# Patient Record
Sex: Male | Born: 1945 | Race: White | Hispanic: No | Marital: Married | Smoking: Never smoker
Health system: Southern US, Community
[De-identification: ages and names within clinical notes are randomized; demographics above are authoritative.]

## PROBLEM LIST (undated history)

## (undated) DIAGNOSIS — C7931 Secondary malignant neoplasm of brain: Secondary | ICD-10-CM

## (undated) DIAGNOSIS — K769 Liver disease, unspecified: Secondary | ICD-10-CM

## (undated) DIAGNOSIS — I219 Acute myocardial infarction, unspecified: Secondary | ICD-10-CM

## (undated) DIAGNOSIS — R911 Solitary pulmonary nodule: Secondary | ICD-10-CM

## (undated) DIAGNOSIS — I2510 Atherosclerotic heart disease of native coronary artery without angina pectoris: Secondary | ICD-10-CM

## (undated) DIAGNOSIS — C439 Malignant melanoma of skin, unspecified: Secondary | ICD-10-CM

## (undated) DIAGNOSIS — I10 Essential (primary) hypertension: Secondary | ICD-10-CM

## (undated) DIAGNOSIS — M1990 Unspecified osteoarthritis, unspecified site: Secondary | ICD-10-CM

## (undated) DIAGNOSIS — K219 Gastro-esophageal reflux disease without esophagitis: Secondary | ICD-10-CM

## (undated) DIAGNOSIS — C799 Secondary malignant neoplasm of unspecified site: Secondary | ICD-10-CM

## (undated) DIAGNOSIS — E785 Hyperlipidemia, unspecified: Secondary | ICD-10-CM

## (undated) HISTORY — DX: Unspecified osteoarthritis, unspecified site: M19.90

## (undated) HISTORY — PX: HERNIA REPAIR: SHX51

## (undated) HISTORY — DX: Gastro-esophageal reflux disease without esophagitis: K21.9

## (undated) HISTORY — DX: Acute myocardial infarction, unspecified: I21.9

## (undated) HISTORY — PX: APPENDECTOMY: SHX54

---

## 1999-10-10 ENCOUNTER — Ambulatory Visit (HOSPITAL_COMMUNITY): Admission: RE | Admit: 1999-10-10 | Discharge: 1999-10-10 | Payer: Self-pay | Admitting: Gastroenterology

## 2001-10-19 ENCOUNTER — Encounter: Payer: Self-pay | Admitting: *Deleted

## 2001-10-19 ENCOUNTER — Inpatient Hospital Stay (HOSPITAL_COMMUNITY): Admission: EM | Admit: 2001-10-19 | Discharge: 2001-10-21 | Payer: Self-pay | Admitting: *Deleted

## 2001-11-01 ENCOUNTER — Encounter: Admission: RE | Admit: 2001-11-01 | Discharge: 2001-11-01 | Payer: Self-pay | Admitting: Family Medicine

## 2001-11-15 ENCOUNTER — Encounter (HOSPITAL_COMMUNITY): Admission: RE | Admit: 2001-11-15 | Discharge: 2002-02-13 | Payer: Self-pay | Admitting: Cardiovascular Disease

## 2002-01-19 ENCOUNTER — Encounter: Admission: RE | Admit: 2002-01-19 | Discharge: 2002-01-19 | Payer: Self-pay | Admitting: Cardiovascular Disease

## 2002-01-19 ENCOUNTER — Encounter: Payer: Self-pay | Admitting: Cardiovascular Disease

## 2004-02-08 ENCOUNTER — Ambulatory Visit (HOSPITAL_COMMUNITY): Admission: RE | Admit: 2004-02-08 | Discharge: 2004-02-08 | Payer: Self-pay | Admitting: Cardiovascular Disease

## 2015-08-20 ENCOUNTER — Ambulatory Visit (INDEPENDENT_AMBULATORY_CARE_PROVIDER_SITE_OTHER): Payer: Medicare Other | Admitting: Physician Assistant

## 2015-08-20 VITALS — BP 130/80 | HR 73 | Temp 97.6°F | Resp 16 | Ht 71.0 in | Wt 190.0 lb

## 2015-08-20 DIAGNOSIS — R059 Cough, unspecified: Secondary | ICD-10-CM

## 2015-08-20 DIAGNOSIS — R05 Cough: Secondary | ICD-10-CM | POA: Diagnosis not present

## 2015-08-20 DIAGNOSIS — J019 Acute sinusitis, unspecified: Secondary | ICD-10-CM

## 2015-08-20 MED ORDER — GUAIFENESIN ER 1200 MG PO TB12: 1.0000 | ORAL_TABLET | Freq: Two times a day (BID) | ORAL | Status: DC | PRN

## 2015-08-20 MED ORDER — HYDROCOD POLST-CPM POLST ER 10-8 MG/5ML PO SUER: 5.0000 mL | Freq: Every evening | ORAL | Status: DC | PRN

## 2015-08-20 MED ORDER — AZITHROMYCIN 250 MG PO TABS: ORAL_TABLET | ORAL | Status: DC

## 2015-08-20 NOTE — Progress Notes (Signed)
[facility] [address], [place] [address] [phone]  Date:  08/20/2015   Name:  Chad Ball   DOB:  [birth year]   MRN:  [id number]  PCP:  No primary care provider on file.   Chief Complaint  Patient presents with  . Nasal Congestion    x 2 weeks  . Cough  . Headache     History of Present Illness:  Chad Ball is a 70 y.o. male patient who presents to [facility]   2 weeks of sneezing and coughing.  Coughing up phlegm.  Took dayquil and nyquil which got better one week ago.  He now has hadache of left side.  Had fever of 101.  Last night, has headache and ear pain.  He is coughing which makes his head hurt.  Dark mucus--dark green green.  No sob or dyspnea.  He had hx of pneumonia 8 months ago.  Similar symptoms.  No sore throat.  He has some congested.  Left sided facial pain that started last night. No watery eyes.  No runny nose.    There are no active problems to display for this patient.   Past Medical History  Diagnosis Date  . Arthritis   . GERD (gastroesophageal reflux disease)   . Myocardial infarction [facility])     Past Surgical History  Procedure Laterality Date  . Appendectomy    . Hernia repair      Social History  Substance Use Topics  . Smoking status: Never Smoker   . Smokeless tobacco: None  . Alcohol Use: None    Family History  Problem Relation Age of Onset  . Heart disease Mother   . Hyperlipidemia Mother     No Known Allergies  Medication list has been reviewed and updated.  No current outpatient prescriptions on file prior to visit.   No current facility-administered medications on file prior to visit.    ROS ROS otherwise unremarkable unless listed above.   Physical Examination: BP 130/80 mmHg  Pulse 73  Temp(Src) 97.6 F (36.4 C)  Resp 16  Ht 5\' 11"  (1.803 m)  Wt 190 lb (86.183 kg)  BMI 26.51 kg/m2  SpO2 95% Ideal Body Weight: Weight in (lb) to have BMI = 25: 178.9  Physical Exam   Constitutional: He is oriented to person, place, and time. He appears well-developed and well-nourished. No distress.  HENT:  Head: Atraumatic.  Right Ear: Tympanic membrane, external ear and ear canal normal.  Left Ear: Tympanic membrane, external ear and ear canal normal.  Nose: Mucosal edema and rhinorrhea present. No epistaxis.  No foreign bodies. Right sinus exhibits no maxillary sinus tenderness and no frontal sinus tenderness. Left sinus exhibits maxillary sinus tenderness. Left sinus exhibits no frontal sinus tenderness.  Mouth/Throat: No uvula swelling. No oropharyngeal exudate, posterior oropharyngeal edema or posterior oropharyngeal erythema.  Eyes: Conjunctivae, EOM and lids are normal. Pupils are equal, round, and reactive to light. Right eye exhibits normal extraocular motion. Left eye exhibits normal extraocular motion.  Neck: Trachea normal and full passive range of motion without pain. No edema and no erythema present.  Cardiovascular: Normal rate.   Pulmonary/Chest: Effort normal. No respiratory distress. He has no decreased breath sounds. He has no wheezes. He has no rhonchi.  Lymphadenopathy:    He has no cervical adenopathy.  Neurological: He is alert and oriented to person, place, and time.  Skin: Skin is warm and dry. He is not diaphoretic.  Psychiatric:  He has a normal mood and affect. His behavior is normal.     Assessment and Plan: Chad Ball is a 70 y.o. male who is here today for congestion and cough.   We will treat with abx at this time.  Given cough medication for support.  He will contact me if he has no improvement in the next 10 days.   Subacute sinusitis, unspecified location - Plan: Guaifenesin (MUCINEX MAXIMUM STRENGTH) 1200 MG TB12, azithromycin (ZITHROMAX) 250 MG tablet  Cough - Plan: chlorpheniramine-HYDROcodone (TUSSIONEX PENNKINETIC ER) 10-8 MG/5ML SUER  Ivar Drape, PA-C [facility] 08/20/2015 10:00 AM

## 2015-08-20 NOTE — Patient Instructions (Addendum)
IF you received an x-ray today, you will receive an invoice from [facility]. Please contact [facility] at [phone] with questions or concerns regarding your invoice.   IF you received labwork today, you will receive an invoice from [employer]. Please contact [facility] at [phone] with questions or concerns regarding your invoice.   Our billing staff will not be able to assist you with questions regarding bills from these companies.  You will be contacted with the lab results as soon as they are available. The fastest way to get your results is to activate your My Chart account. Instructions are located on the last page of this paperwork. If you have not heard from [place] regarding the results in 2 weeks, please contact this office.   Please make sure that you are hydrating well with 64 oz of water or more.  Sinusitis, Adult Sinusitis is redness, soreness, and inflammation of the paranasal sinuses. Paranasal sinuses are air pockets within the bones of your face. They are located beneath your eyes, in the middle of your forehead, and above your eyes. In healthy paranasal sinuses, mucus is able to drain out, and air is able to circulate through them by way of your nose. However, when your paranasal sinuses are inflamed, mucus and air can become trapped. This can allow bacteria and other germs to grow and cause infection. Sinusitis can develop quickly and last only a short time (acute) or continue over a long period (chronic). Sinusitis that lasts for more than 12 weeks is considered chronic. CAUSES Causes of sinusitis include:  Allergies.  Structural abnormalities, such as displacement of the cartilage that separates your nostrils (deviated septum), which can decrease the air flow through your nose and sinuses and affect sinus drainage.  Functional abnormalities, such as when the small hairs (cilia) that line your sinuses and help remove mucus do  not work properly or are not present. SIGNS AND SYMPTOMS Symptoms of acute and chronic sinusitis are the same. The primary symptoms are pain and pressure around the affected sinuses. Other symptoms include:  Upper toothache.  Earache.  Headache.  Bad breath.  Decreased sense of smell and taste.  A cough, which worsens when you are lying flat.  Fatigue.  Fever.  Thick drainage from your nose, which often is green and may contain pus (purulent).  Swelling and warmth over the affected sinuses. DIAGNOSIS Your health care provider will perform a physical exam. During your exam, your health care provider may perform any of the following to help determine if you have acute sinusitis or chronic sinusitis:  Look in your nose for signs of abnormal growths in your nostrils (nasal polyps).  Tap over the affected sinus to check for signs of infection.  View the inside of your sinuses using an imaging device that has a light attached (endoscope). If your health care provider suspects that you have chronic sinusitis, one or more of the following tests may be recommended:  Allergy tests.  Nasal culture. A sample of mucus is taken from your nose, sent to a lab, and screened for bacteria.  Nasal cytology. A sample of mucus is taken from your nose and examined by your health care provider to determine if your sinusitis is related to an allergy. TREATMENT Most cases of acute sinusitis are related to a viral infection and will resolve on their own within 10 days. Sometimes, medicines are prescribed to help relieve symptoms of both acute and chronic sinusitis. These may include pain medicines,  decongestants, nasal steroid sprays, or saline sprays. However, for sinusitis related to a bacterial infection, your health care provider will prescribe antibiotic medicines. These are medicines that will help kill the bacteria causing the infection. Rarely, sinusitis is caused by a fungal infection. In  these cases, your health care provider will prescribe antifungal medicine. For some cases of chronic sinusitis, surgery is needed. Generally, these are cases in which sinusitis recurs more than 3 times per year, despite other treatments. HOME CARE INSTRUCTIONS  Drink plenty of water. Water helps thin the mucus so your sinuses can drain more easily.  Use a humidifier.  Inhale steam 3-4 times a day (for example, sit in the bathroom with the shower running).  Apply a warm, moist washcloth to your face 3-4 times a day, or as directed by your health care provider.  Use saline nasal sprays to help moisten and clean your sinuses.  Take medicines only as directed by your health care provider.  If you were prescribed either an antibiotic or antifungal medicine, finish it all even if you start to feel better. SEEK IMMEDIATE MEDICAL CARE IF:  You have increasing pain or severe headaches.  You have nausea, vomiting, or drowsiness.  You have swelling around your face.  You have vision problems.  You have a stiff neck.  You have difficulty breathing.   This information is not intended to replace advice given to you by your health care provider. Make sure you discuss any questions you have with your health care provider.   Document Released: 04/13/2005 Document Revised: 05/04/2014 Document Reviewed: 04/28/2011 Elsevier Interactive Patient Education [employer].

## 2015-09-02 ENCOUNTER — Ambulatory Visit (INDEPENDENT_AMBULATORY_CARE_PROVIDER_SITE_OTHER): Payer: Medicare Other | Admitting: Family Medicine

## 2015-09-02 VITALS — BP 110/68 | HR 62 | Temp 97.4°F | Resp 18 | Ht 71.0 in | Wt 185.0 lb

## 2015-09-02 DIAGNOSIS — I2510 Atherosclerotic heart disease of native coronary artery without angina pectoris: Secondary | ICD-10-CM | POA: Diagnosis not present

## 2015-09-02 DIAGNOSIS — I10 Essential (primary) hypertension: Secondary | ICD-10-CM

## 2015-09-02 DIAGNOSIS — N390 Urinary tract infection, site not specified: Secondary | ICD-10-CM | POA: Diagnosis not present

## 2015-09-02 DIAGNOSIS — E785 Hyperlipidemia, unspecified: Secondary | ICD-10-CM

## 2015-09-02 DIAGNOSIS — R509 Fever, unspecified: Secondary | ICD-10-CM

## 2015-09-02 DIAGNOSIS — R8281 Pyuria: Secondary | ICD-10-CM

## 2015-09-02 HISTORY — DX: Essential (primary) hypertension: I10

## 2015-09-02 HISTORY — DX: Hyperlipidemia, unspecified: E78.5

## 2015-09-02 HISTORY — DX: Atherosclerotic heart disease of native coronary artery without angina pectoris: I25.10

## 2015-09-02 LAB — POCT URINALYSIS DIP (MANUAL ENTRY)
Glucose, UA: NEGATIVE
Nitrite, UA: POSITIVE — AB
Protein Ur, POC: 100 — AB
Spec Grav, UA: 1.025
Urobilinogen, UA: 1
pH, UA: 5

## 2015-09-02 LAB — POCT CBC
Granulocyte percent: 90.5 %G — AB (ref 37–80)
HCT, POC: 42.3 % — AB (ref 43.5–53.7)
Hemoglobin: 14.7 g/dL (ref 14.1–18.1)
Lymph, poc: 1.7 (ref 0.6–3.4)
MCH, POC: 31.1 pg (ref 27–31.2)
MCHC: 34.9 g/dL (ref 31.8–35.4)
MCV: 89.1 fL (ref 80–97)
MID (cbc): 0.6 (ref 0–0.9)
MPV: 7.2 fL (ref 0–99.8)
POC Granulocyte: 21.4 — AB (ref 2–6.9)
POC LYMPH PERCENT: 7.1 %L — AB (ref 10–50)
POC MID %: 2.4 %M (ref 0–12)
Platelet Count, POC: 255 10*3/uL (ref 142–424)
RBC: 4.74 M/uL (ref 4.69–6.13)
RDW, POC: 13.7 %
WBC: 23.6 10*3/uL — AB (ref 4.6–10.2)

## 2015-09-02 LAB — POC MICROSCOPIC URINALYSIS (UMFC)

## 2015-09-02 MED ORDER — CIPROFLOXACIN HCL 500 MG PO TABS: 500.0000 mg | ORAL_TABLET | Freq: Two times a day (BID) | ORAL | Status: DC

## 2015-09-02 NOTE — Progress Notes (Signed)
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  Associated with fatigue and urinary frequency.   Married.  Travels 2 days a week up to [place].  Sig Negs:  No cough or sinus symptoms since taking the Zpak two weeks ago.  No sore throat.  No travel, no tick bite, no rash, no hematuria, no h/o flank pain or kidney stone.  Associated: some back soreness  PMHx:  Coronary stent  PCP:  Dr. Ouida Sills in [place], [place]  Objective: BP 110/68 mmHg  Pulse 62  Temp(Src) 97.4 F (36.3 C) (Oral)  Resp 18  Ht 5\' 11"  (1.803 m)  Wt 185 lb (83.915 kg)  BMI 25.81 kg/m2  SpO2 98% Alert, NAD, articulate  Neck: supple, no adenopathy HEENT: unremarkable Chest:  Clear Heart: regular, no murmur or gallop Abdomen:  No HSM, tenderness, mass. Skin: no icterus or rash Ext: moving without problem in exam room Results for orders placed or performed in visit on 09/02/15  POCT urinalysis dipstick  Result Value Ref Range   Color, UA brown (A) yellow   Clarity, UA cloudy (A) clear   Glucose, UA negative negative   Bilirubin, UA large (A) negative   Ketones, POC UA small (15) (A) negative   Spec Grav, UA 1.025    Blood, UA moderate (A) negative   pH, UA 5.0    Protein Ur, POC =100 (A) negative   Urobilinogen, UA 1.0    Nitrite, UA Positive (A) Negative   Leukocytes, UA Trace (A) Negative  POCT Microscopic Urinalysis ([facility])  Result Value Ref Range   WBC,UR,HPF,POC Too numerous to count  (A) None WBC/hpf   RBC,UR,HPF,POC Moderate (A) None RBC/hpf   Bacteria Moderate (A) None, Too numerous to count   Mucus Present (A) Absent   Epithelial Cells, UR Per Microscopy Few (A) None, Too numerous to count cells/hpf  POCT CBC  Result Value Ref Range   WBC 23.6 (A) 4.6 - 10.2 K/uL   Lymph, poc 1.7 0.6 - 3.4   POC LYMPH PERCENT 7.1 (A) 10 - 50 %L   MID (cbc) 0.6 0 - 0.9   POC MID % 2.4 0 - 12 %M   POC Granulocyte 21.4 (A) 2 - 6.9   Granulocyte percent 90.5 (A) 37 - 80 %G   RBC 4.74  4.69 - 6.13 M/uL   Hemoglobin 14.7 14.1 - 18.1 g/dL   HCT, POC 42.3 (A) 43.5 - 53.7 %   MCV 89.1 80 - 97 fL   MCH, POC 31.1 27 - 31.2 pg   MCHC 34.9 31.8 - 35.4 g/dL   RDW, POC 13.7 %   Platelet Count, POC 255 142 - 424 K/uL   MPV 7.2 0 - 99.8 fL    Fever and chills - Plan: POCT urinalysis dipstick, POCT Microscopic Urinalysis ([facility]), POCT CBC, Urine culture, ciprofloxacin (CIPRO) 500 MG tablet  Essential hypertension  Hyperlipidemia  CAD in native artery  Pyuria - Plan: Urine culture, ciprofloxacin (CIPRO) 500 MG tablet  Robyn Haber, MD

## 2015-09-02 NOTE — Patient Instructions (Addendum)
Please return when you finish the antibiotics so we can recheck the urine   Urinary Tract Infection Urinary tract infections (UTIs) can develop anywhere along your urinary tract. Your urinary tract is your body's drainage system for removing wastes and extra water. Your urinary tract includes two kidneys, two ureters, a bladder, and a urethra. Your kidneys are a pair of bean-shaped organs. Each kidney is about the size of your fist. They are located below your ribs, one on each side of your spine. CAUSES Infections are caused by microbes, which are microscopic organisms, including fungi, viruses, and bacteria. These organisms are so small that they can only be seen through a microscope. Bacteria are the microbes that most commonly cause UTIs. SYMPTOMS  Symptoms of UTIs may vary by age and gender of the patient and by the location of the infection. Symptoms in young women typically include a frequent and intense urge to urinate and a painful, burning feeling in the bladder or urethra during urination. Older women and men are more likely to be tired, shaky, and weak and have muscle aches and abdominal pain. A fever may mean the infection is in your kidneys. Other symptoms of a kidney infection include pain in your back or sides below the ribs, nausea, and vomiting. DIAGNOSIS To diagnose a UTI, your caregiver will ask you about your symptoms. Your caregiver will also ask you to provide a urine sample. The urine sample will be tested for bacteria and white blood cells. White blood cells are made by your body to help fight infection. TREATMENT  Typically, UTIs can be treated with medication. Because most UTIs are caused by a bacterial infection, they usually can be treated with the use of antibiotics. The choice of antibiotic and length of treatment depend on your symptoms and the type of bacteria causing your infection. HOME CARE INSTRUCTIONS  If you were prescribed antibiotics, take them exactly as your  caregiver instructs you. Finish the medication even if you feel better after you have only taken some of the medication.  Drink enough water and fluids to keep your urine clear or pale yellow.  Avoid caffeine, tea, and carbonated beverages. They tend to irritate your bladder.  Empty your bladder often. Avoid holding urine for long periods of time.  Empty your bladder before and after sexual intercourse.  After a bowel movement, women should cleanse from front to back. Use each tissue only once. SEEK MEDICAL CARE IF:   You have back pain.  You develop a fever.  Your symptoms do not begin to resolve within 3 days. SEEK IMMEDIATE MEDICAL CARE IF:   You have severe back pain or lower abdominal pain.  You develop chills.  You have nausea or vomiting.  You have continued burning or discomfort with urination. MAKE SURE YOU:   Understand these instructions.  Will watch your condition.  Will get help right away if you are not doing well or get worse.   This information is not intended to replace advice given to you by your health care provider. Make sure you discuss any questions you have with your health care provider.   Document Released: 01/21/2005 Document Revised: 01/02/2015 Document Reviewed: 05/22/2011 Elsevier Interactive Patient Education 2016 [employer].    IF you received an x-ray today, you will receive an invoice from [facility]. Please contact [facility] at [phone] with questions or concerns regarding your invoice.   IF you received labwork today, you will receive an invoice from [facility]. Please contact [facility] at [phone] with questions or concerns regarding your invoice.   Our billing staff will not be able to assist you with questions regarding bills from these companies.  You will be contacted with the lab results as soon as they are available. The fastest way to get your results is to activate  your My Chart account. Instructions are located on the last page of this paperwork. If you have not heard from [place] regarding the results in 2 weeks, please contact this office.

## 2015-09-04 LAB — URINE CULTURE: Colony Count: >=100000

## 2015-09-06 ENCOUNTER — Encounter: Payer: Self-pay | Admitting: *Deleted

## 2015-09-17 ENCOUNTER — Telehealth: Payer: Self-pay | Admitting: *Deleted

## 2015-09-17 NOTE — Telephone Encounter (Signed)
Patient was given results 

## 2015-11-24 ENCOUNTER — Inpatient Hospital Stay (HOSPITAL_COMMUNITY): Payer: Medicare Other

## 2015-11-24 ENCOUNTER — Encounter (HOSPITAL_COMMUNITY): Payer: Self-pay | Admitting: *Deleted

## 2015-11-24 ENCOUNTER — Telehealth: Payer: Self-pay | Admitting: Internal Medicine

## 2015-11-24 ENCOUNTER — Inpatient Hospital Stay (HOSPITAL_COMMUNITY)
Admission: AD | Admit: 2015-11-24 | Discharge: 2015-12-05 | DRG: 054 | Disposition: A | Discharge status: Skilled Nursing Facility | Payer: Medicare Other | Source: Other Acute Inpatient Hospital | Attending: Family Medicine | Admitting: Family Medicine

## 2015-11-24 DIAGNOSIS — C7931 Secondary malignant neoplasm of brain: Principal | ICD-10-CM | POA: Diagnosis present

## 2015-11-24 DIAGNOSIS — C799 Secondary malignant neoplasm of unspecified site: Secondary | ICD-10-CM

## 2015-11-24 DIAGNOSIS — C7800 Secondary malignant neoplasm of unspecified lung: Secondary | ICD-10-CM | POA: Diagnosis present

## 2015-11-24 DIAGNOSIS — R627 Adult failure to thrive: Secondary | ICD-10-CM | POA: Diagnosis present

## 2015-11-24 DIAGNOSIS — D72829 Elevated white blood cell count, unspecified: Secondary | ICD-10-CM | POA: Diagnosis present

## 2015-11-24 DIAGNOSIS — T380X5A Adverse effect of glucocorticoids and synthetic analogues, initial encounter: Secondary | ICD-10-CM | POA: Diagnosis present

## 2015-11-24 DIAGNOSIS — I2510 Atherosclerotic heart disease of native coronary artery without angina pectoris: Secondary | ICD-10-CM | POA: Diagnosis present

## 2015-11-24 DIAGNOSIS — I10 Essential (primary) hypertension: Secondary | ICD-10-CM | POA: Diagnosis not present

## 2015-11-24 DIAGNOSIS — R911 Solitary pulmonary nodule: Secondary | ICD-10-CM

## 2015-11-24 DIAGNOSIS — Z8249 Family history of ischemic heart disease and other diseases of the circulatory system: Secondary | ICD-10-CM

## 2015-11-24 DIAGNOSIS — C439 Malignant melanoma of skin, unspecified: Secondary | ICD-10-CM | POA: Diagnosis present

## 2015-11-24 DIAGNOSIS — K7689 Other specified diseases of liver: Secondary | ICD-10-CM | POA: Diagnosis not present

## 2015-11-24 DIAGNOSIS — K769 Liver disease, unspecified: Secondary | ICD-10-CM

## 2015-11-24 DIAGNOSIS — Z79899 Other long term (current) drug therapy: Secondary | ICD-10-CM

## 2015-11-24 DIAGNOSIS — M1990 Unspecified osteoarthritis, unspecified site: Secondary | ICD-10-CM | POA: Diagnosis present

## 2015-11-24 DIAGNOSIS — Z7982 Long term (current) use of aspirin: Secondary | ICD-10-CM | POA: Diagnosis not present

## 2015-11-24 DIAGNOSIS — E785 Hyperlipidemia, unspecified: Secondary | ICD-10-CM | POA: Diagnosis present

## 2015-11-24 DIAGNOSIS — K219 Gastro-esophageal reflux disease without esophagitis: Secondary | ICD-10-CM | POA: Diagnosis present

## 2015-11-24 DIAGNOSIS — G936 Cerebral edema: Secondary | ICD-10-CM | POA: Diagnosis present

## 2015-11-24 DIAGNOSIS — G9389 Other specified disorders of brain: Secondary | ICD-10-CM | POA: Diagnosis present

## 2015-11-24 DIAGNOSIS — D496 Neoplasm of unspecified behavior of brain: Secondary | ICD-10-CM | POA: Diagnosis not present

## 2015-11-24 DIAGNOSIS — G939 Disorder of brain, unspecified: Secondary | ICD-10-CM

## 2015-11-24 DIAGNOSIS — I252 Old myocardial infarction: Secondary | ICD-10-CM | POA: Diagnosis not present

## 2015-11-24 HISTORY — DX: Secondary malignant neoplasm of brain: C79.31

## 2015-11-24 LAB — COMPREHENSIVE METABOLIC PANEL
ALBUMIN: 3.8 g/dL (ref 3.5–5.0)
ALT: 28 U/L (ref 17–63)
AST: 32 U/L (ref 15–41)
Alkaline Phosphatase: 81 U/L (ref 38–126)
Anion gap: 9 (ref 5–15)
BILIRUBIN TOTAL: 1.3 mg/dL — AB (ref 0.3–1.2)
BUN: 26 mg/dL — AB (ref 6–20)
CHLORIDE: 102 mmol/L (ref 101–111)
CO2: 25 mmol/L (ref 22–32)
CREATININE: 0.93 mg/dL (ref 0.61–1.24)
Calcium: 9.4 mg/dL (ref 8.9–10.3)
GFR calc Af Amer: >60 mL/min (ref >60)
GLUCOSE: 148 mg/dL — AB (ref 65–99)
POTASSIUM: 3.7 mmol/L (ref 3.5–5.1)
Sodium: 136 mmol/L (ref 135–145)
Total Protein: 7.2 g/dL (ref 6.5–8.1)

## 2015-11-24 LAB — CBC WITH DIFFERENTIAL/PLATELET
Basophils Absolute: 0 10*3/uL (ref 0.0–0.1)
Basophils Relative: 0 %
EOS ABS: 0 10*3/uL (ref 0.0–0.7)
EOS PCT: 0 %
HCT: 45.8 % (ref 39.0–52.0)
Hemoglobin: 15.4 g/dL (ref 13.0–17.0)
LYMPHS ABS: 0.6 10*3/uL — AB (ref 0.7–4.0)
LYMPHS PCT: 2 %
MCH: 30.6 pg (ref 26.0–34.0)
MCHC: 33.6 g/dL (ref 30.0–36.0)
MCV: 91.1 fL (ref 78.0–100.0)
MONO ABS: 1.4 10*3/uL — AB (ref 0.1–1.0)
Monocytes Relative: 6 %
Neutro Abs: 21.9 10*3/uL — ABNORMAL HIGH (ref 1.7–7.7)
Neutrophils Relative %: 92 %
PLATELETS: 324 10*3/uL (ref 150–400)
RBC: 5.03 MIL/uL (ref 4.22–5.81)
RDW: 14.1 % (ref 11.5–15.5)
WBC: 23.9 10*3/uL — AB (ref 4.0–10.5)

## 2015-11-24 LAB — PROTIME-INR
INR: 1.04
Prothrombin Time: 13.6 seconds (ref 11.4–15.2)

## 2015-11-24 LAB — MRSA PCR SCREENING: MRSA BY PCR: NEGATIVE

## 2015-11-24 LAB — TSH: TSH: 0.924 u[IU]/mL (ref 0.350–4.500)

## 2015-11-24 LAB — APTT: APTT: 26 s (ref 24–36)

## 2015-11-24 MED ORDER — LEVETIRACETAM 500 MG PO TABS: 500.0000 mg | ORAL_TABLET | Freq: Two times a day (BID) | ORAL | Status: DC

## 2015-11-24 MED ORDER — METOPROLOL SUCCINATE ER 25 MG PO TB24: 25.0000 mg | ORAL_TABLET | Freq: Every day | ORAL | Status: DC

## 2015-11-24 MED ORDER — SODIUM CHLORIDE 0.9 % IV SOLN
750.0000 mg | Freq: Two times a day (BID) | INTRAVENOUS | Status: DC
  Administered 2015-11-25 – 2015-11-30 (×11): 750 mg via INTRAVENOUS
  Filled 2015-11-24 (×13): qty 7.5

## 2015-11-24 MED ORDER — MIDAZOLAM HCL 2 MG/2ML IJ SOLN: 2.0000 mg | Freq: Once | INTRAMUSCULAR | Status: AC

## 2015-11-24 MED ORDER — SODIUM CHLORIDE 0.9% FLUSH
3.0000 mL | Freq: Two times a day (BID) | INTRAVENOUS | Status: DC
  Administered 2015-11-24 – 2015-12-03 (×9): 3 mL via INTRAVENOUS

## 2015-11-24 MED ORDER — ACETAMINOPHEN 325 MG PO TABS
650.0000 mg | ORAL_TABLET | Freq: Four times a day (QID) | ORAL | Status: DC | PRN
Start: 2015-11-24 — End: 2015-11-25

## 2015-11-24 MED ORDER — ATORVASTATIN CALCIUM 40 MG PO TABS
80.0000 mg | ORAL_TABLET | Freq: Every day | ORAL | Status: DC
  Administered 2015-11-25 – 2015-12-04 (×8): 80 mg via ORAL
  Filled 2015-11-24: qty 1
  Filled 2015-11-24: qty 2
  Filled 2015-11-24: qty 1
  Filled 2015-11-24: qty 2
  Filled 2015-11-24: qty 1
  Filled 2015-11-24: qty 2
  Filled 2015-11-24: qty 1
  Filled 2015-11-24: qty 2

## 2015-11-24 MED ORDER — DEXAMETHASONE SODIUM PHOSPHATE 10 MG/ML IJ SOLN
INTRAMUSCULAR | Status: AC
  Filled 2015-11-24: qty 1

## 2015-11-24 MED ORDER — METOPROLOL TARTRATE 12.5 MG HALF TABLET
12.5000 mg | ORAL_TABLET | Freq: Two times a day (BID) | ORAL | Status: DC
  Administered 2015-11-25: 12.5 mg via ORAL
  Filled 2015-11-24: qty 1

## 2015-11-24 MED ORDER — PANTOPRAZOLE SODIUM 40 MG PO TBEC
40.0000 mg | DELAYED_RELEASE_TABLET | Freq: Every day | ORAL | Status: DC
  Administered 2015-11-25 – 2015-11-27 (×2): 40 mg via ORAL
  Filled 2015-11-24 (×2): qty 1

## 2015-11-24 MED ORDER — MIDAZOLAM HCL 2 MG/2ML IJ SOLN
INTRAMUSCULAR | Status: AC
  Administered 2015-11-24: 23:00:00
  Filled 2015-11-24: qty 2

## 2015-11-24 MED ORDER — DEXAMETHASONE SODIUM PHOSPHATE 4 MG/ML IJ SOLN
4.0000 mg | Freq: Four times a day (QID) | INTRAMUSCULAR | Status: DC
  Administered 2015-11-25 – 2015-12-05 (×41): 4 mg via INTRAVENOUS
  Filled 2015-11-24 (×41): qty 1

## 2015-11-24 MED ORDER — HYDROCODONE-ACETAMINOPHEN 5-325 MG PO TABS
1.0000 | ORAL_TABLET | Freq: Four times a day (QID) | ORAL | Status: DC | PRN
  Administered 2015-11-24 – 2015-11-25 (×2): 1 via ORAL
  Filled 2015-11-24 (×2): qty 1

## 2015-11-24 MED ORDER — DEXAMETHASONE SODIUM PHOSPHATE 10 MG/ML IJ SOLN
10.0000 mg | Freq: Once | INTRAMUSCULAR | Status: AC
  Administered 2015-11-24: 10 mg via INTRAVENOUS

## 2015-11-24 MED ORDER — LOSARTAN POTASSIUM 25 MG PO TABS
25.0000 mg | ORAL_TABLET | Freq: Every day | ORAL | Status: DC
  Administered 2015-11-25 – 2015-11-27 (×2): 25 mg via ORAL
  Filled 2015-11-24 (×3): qty 1

## 2015-11-24 MED ORDER — ADULT MULTIVITAMIN W/MINERALS CH
1.0000 | ORAL_TABLET | Freq: Every day | ORAL | Status: DC
  Administered 2015-11-25 – 2015-12-05 (×10): 1 via ORAL
  Filled 2015-11-24 (×10): qty 1

## 2015-11-24 NOTE — Telephone Encounter (Signed)
Received call from [facility] [address] Jacinto Halim, hospitalist via [facility] trx.  trx pending NSG acceptance.  Pt w/ hx of cad prior stents, sp melanoma skin resection 1 month ago, p/w confusion outside hospital.  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  Needs NSG eval.  Pt from [facility], requesting trx.  CT CAP w/ pulm nodule noted as well.  Currently VSS, neuro stable, needs vest restraints, still confused.  Wbc 13, no signs of infection, on decadron 4 qid, and keppra 500bid (no sz noted on exam, emp trx).  Awaiting NSG acceptance, to be paged to [address]  Stepdown bed if NSG accepts c/s.

## 2015-11-24 NOTE — Consult Note (Signed)
70 year old man with recent diagnosis of melanoma.  Altered mental status since Thursday, mostly dysphasia.  Reportedly has multiple intracranial masses including large left temporal and cerebellar lesions.  He is awake, alert, oriented to person only.  He follows commands throughout.  Will give Dexamethasone 10mg  IV x1 now, increase Keppra to 750mg  bid, and order MRI brain w/wo contrast and brainlab protocol.  Ok to have low dose Norco for headaches.  Dr. Kathyrn Sheriff will assume his neurosurgical care tomorrow.

## 2015-11-24 NOTE — Progress Notes (Signed)
Pt able to drink water and eat pudding without any coughing or distress. Pt's mouth was clear after eating and pt able to voice clearly that everything felt OK when swallowing. Consuelo Pandy RN

## 2015-11-24 NOTE — H&P (Signed)
History and Physical    Chad Ball [id number] DOB: 1945/09/10 DOA: 11/24/2015   PCP: No primary care provider on file.   Patient coming from:  Home  Chief Complaint:  Brain lesions   HPI: Chad Ball is a 70 y.o. male with medical history significant for CAD s/p MI s/p stent , arthritis, GERD, recent diagnosis of melanoma 1 month ago ehich was to be followed as an outpatient, brought  via [facility]  for furtherworkup of metastatic brain lesions. In review, the patient had an episode of acute confusion was visiting his relative in [place]. In addition, he was experiencing severe, constant headaches around the temporal and frontal area, for about one week prior to presentation. Upon admission to [facility], the patient was incoherent, unable to articulate words.this weeks, he had no other or abuse symptoms such as dizziness, nausea, vomiting or vision changes. At the hospital, his CT of the head was performed, revealing multiple intracranial lesions, in the posterior foci. MRI brain confirmed these findings, including  at least 2 separate lesions in the right cerebellum and bilateral temporal lobe lesions. CT of the chest abdomen and pelvis showed 2 low-density nodules in the liver, most likely cysts, and a CT of the chest showed a 2.4 x 1.8 cm left upper lobe nodule, with pathologic lymphadenopathy in the left hilum.   He received IV steroids with some improvement of his mental status. Because he lives in [place], he was transferred to [facility] for Neurosurgical evaluation in order to obtain the proper tissue diagnosis for treatment options. Patient is currently alert, but unable to engage in conversation, he follows very simple, and. According to his wife, he is more interactive since the initiation of steroids.  ED Course:  BP (!) 155/82 (BP Location: Right Arm)   Pulse (!) 51   Resp 14   Ht 6\' 1"  (1.854 m)   Wt 76 kg (167 lb 8.8 oz)   SpO2 98%   BMI 22.11  kg/m    WBC  13, no signs of infection, on decadron 4 qid, and keppra 500bid    Review of Systems: As per HPI otherwise 10 point review of systems negative.   Past Medical History:  Diagnosis Date  . Arthritis   . GERD (gastroesophageal reflux disease)   . Myocardial infarction [facility])     Past Surgical History:  Procedure Laterality Date  . APPENDECTOMY    . HERNIA REPAIR      Social History Social History   Social History  . Marital status: Married    Spouse name: Morice Kuczkowski  . Number of children: 2  . Years of education: N/A   Occupational History  . Not on file.   Social History Main Topics  . Smoking status: Never Smoker  . Smokeless tobacco: Not on file  . Alcohol use Not on file  . Drug use: Unknown  . Sexual activity: Not on file   Other Topics Concern  . Not on file   Social History Narrative  . No narrative on file     No Known Allergies  Family History  Problem Relation Age of Onset  . Heart disease Mother   . Hyperlipidemia Mother       Prior to Admission medications   Medication Sig Start Date End Date Taking? Authorizing Provider  aspirin 81 MG tablet Take 81 mg by mouth daily.    Historical Provider, MD  atorvastatin (LIPITOR) 80 MG tablet Take 80 mg by  mouth daily.    Historical Provider, MD  ciprofloxacin (CIPRO) 500 MG tablet Take 1 tablet (500 mg total) by mouth 2 (two) times daily. 09/02/15   Robyn Haber, MD  losartan (COZAAR) 25 MG tablet Take 25 mg by mouth daily.    Historical Provider, MD  metoprolol succinate (TOPROL-XL) 25 MG 24 hr tablet Take 25 mg by mouth daily.    Historical Provider, MD  Multiple Vitamin (MULTIVITAMIN) tablet Take 1 tablet by mouth daily.    Historical Provider, MD  omeprazole (PRILOSEC) 20 MG capsule Take 20 mg by mouth daily.    Historical Provider, MD    Physical Exam:    Vitals:   11/24/15 1622  BP: (!) 155/82  Pulse: (!) 51  Resp: 14  SpO2: 98%  Weight: 76 kg (167 lb 8.8 oz)  Height:  6\' 1"  (1.854 m)       Constitutional: NAD, calm, comfortable   Vitals:   11/24/15 1622  BP: (!) 155/82  Pulse: (!) 51  Resp: 14  SpO2: 98%  Weight: 76 kg (167 lb 8.8 oz)  Height: 6\' 1"  (1.854 m)   Eyes: PERRL, lids and conjunctivae normal ENMT: Mucous membranes are moist. Posterior pharynx clear of any exudate or lesions. Normal dentition.  Neck: normal, supple, no masses, no thyromegaly Respiratory: clear to auscultation bilaterally, no wheezing, no crackles. Normal respiratory effort. No accessory muscle use.  Cardiovascular: Regular rate and rhythm, no murmurs / rubs / gallops. No extremity edema. 2+ pedal pulses. No carotid bruits.  Abdomen: no tenderness, no masses palpated. No hepatosplenomegaly. Bowel sounds positive.  Musculoskeletal: no clubbing / cyanosis. No joint deformity upper and lower extremities. Good ROM, no contractures. Normal muscle tone.  Skin: sun exposed skin, several suspicious moles are noted, a well-healed scar at the central chest, both about 1.5 cm is noted, at the site of his melanoma removal. Neurologic: follows very simple, and, unable to engage in conversation, appears confused.    Labs on Admission: I have personally reviewed following labs and imaging studies  CBC: No results for input(s): WBC, NEUTROABS, HGB, HCT, MCV, PLT in the last 168 hours.  Basic Metabolic Panel: No results for input(s): NA, K, CL, CO2, GLUCOSE, BUN, CREATININE, CALCIUM, MG, PHOS in the last 168 hours.  GFR: CrCl cannot be calculated (No order found.).  Liver Function Tests: No results for input(s): AST, ALT, ALKPHOS, BILITOT, PROT, ALBUMIN in the last 168 hours. No results for input(s): LIPASE, AMYLASE in the last 168 hours. No results for input(s): AMMONIA in the last 168 hours.  Coagulation Profile: No results for input(s): INR, PROTIME in the last 168 hours.  Cardiac Enzymes: No results for input(s): CKTOTAL, CKMB, CKMBINDEX, TROPONINI in the last 168  hours.  BNP (last 3 results) No results for input(s): PROBNP in the last 8760 hours.  HbA1C: No results for input(s): HGBA1C in the last 72 hours.  CBG: No results for input(s): GLUCAP in the last 168 hours.  Lipid Profile: No results for input(s): CHOL, HDL, LDLCALC, TRIG, CHOLHDL, LDLDIRECT in the last 72 hours.  Thyroid Function Tests: No results for input(s): TSH, T4TOTAL, FREET4, T3FREE, THYROIDAB in the last 72 hours.  Anemia Panel: No results for input(s): VITAMINB12, FOLATE, FERRITIN, TIBC, IRON, RETICCTPCT in the last 72 hours.   Sepsis Labs: @LABRCNTIP (procalcitonin:4,lacticidven:4) )No results found for this or any previous visit (from the past 240 hour(s)).   Radiological Exams on Admission: No results found.  EKG: Independently reviewed.  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 In addition, CT of the C/A?P shows liver and LUL lung nodule  with adenopathy   Will need tissue diagnosis for therapeutic options. Neurosurgical evaluation is pending  Will await there results, and to call Oncology once diagnosis is obtained Continue  decadron 4mg  qid, and keppra 500 mg bid  Consider swallow evaluation due to confusional state  CAD,s/p stent   patient appears  cardiac pain free at this time. Hold ASA for now, Plavix high dose statin, beta blockers, sartan  Hyperlipidemia Continue home statins   Leukocytosis, likely reactive, related to steroids WBC 13 Afebrile   IVF  Will hold antibiotics for now  Repeat CBC in AM  GERD, no acute symptoms: Continue PPI   DVT prophylaxis: SCD's Code Status:   Full  Family Communication:  Discussed with patient wife  Disposition Plan: Expect patient to be discharged to home after condition  improves Consults called:    Neurosurgery Admission status: SDU    Rondel Jumbo, PA-C [facility]   If 7PM-7AM, please contact night-coverage [email or web address] Password [email or web address]  11/24/2015, 5:10 PM

## 2015-11-25 ENCOUNTER — Inpatient Hospital Stay (HOSPITAL_COMMUNITY): Payer: Medicare Other

## 2015-11-25 LAB — BASIC METABOLIC PANEL
Anion gap: 9 (ref 5–15)
BUN: 26 mg/dL — AB (ref 6–20)
CO2: 26 mmol/L (ref 22–32)
Calcium: 9.3 mg/dL (ref 8.9–10.3)
Chloride: 103 mmol/L (ref 101–111)
Creatinine, Ser: 0.92 mg/dL (ref 0.61–1.24)
GFR calc Af Amer: >60 mL/min (ref >60)
GLUCOSE: 150 mg/dL — AB (ref 65–99)
POTASSIUM: 3.6 mmol/L (ref 3.5–5.1)
Sodium: 138 mmol/L (ref 135–145)

## 2015-11-25 LAB — CBC
HEMATOCRIT: 46.2 % (ref 39.0–52.0)
Hemoglobin: 15.4 g/dL (ref 13.0–17.0)
MCH: 30.8 pg (ref 26.0–34.0)
MCHC: 33.3 g/dL (ref 30.0–36.0)
MCV: 92.4 fL (ref 78.0–100.0)
Platelets: 286 10*3/uL (ref 150–400)
RBC: 5 MIL/uL (ref 4.22–5.81)
RDW: 14.2 % (ref 11.5–15.5)
WBC: 18.9 10*3/uL — ABNORMAL HIGH (ref 4.0–10.5)

## 2015-11-25 LAB — HEMOGLOBIN A1C
HEMOGLOBIN A1C: 5.4 % (ref 4.8–5.6)
MEAN PLASMA GLUCOSE: 108 mg/dL

## 2015-11-25 LAB — GLUCOSE, CAPILLARY: GLUCOSE-CAPILLARY: 159 mg/dL — AB (ref 65–99)

## 2015-11-25 MED ORDER — MORPHINE SULFATE (PF) 2 MG/ML IV SOLN
1.0000 mg | INTRAVENOUS | Status: DC | PRN
  Administered 2015-11-26 – 2015-12-02 (×2): 2 mg via INTRAVENOUS
  Filled 2015-11-25 (×2): qty 1

## 2015-11-25 MED ORDER — CARVEDILOL 3.125 MG PO TABS
3.1250 mg | ORAL_TABLET | Freq: Two times a day (BID) | ORAL | Status: DC
  Administered 2015-11-27 – 2015-12-05 (×16): 3.125 mg via ORAL
  Filled 2015-11-25 (×16): qty 1

## 2015-11-25 MED ORDER — OXYCODONE HCL 5 MG PO TABS
5.0000 mg | ORAL_TABLET | ORAL | Status: DC | PRN
  Administered 2015-11-25 – 2015-11-27 (×4): 10 mg via ORAL
  Administered 2015-11-28: 5 mg via ORAL
  Administered 2015-11-28 – 2015-12-04 (×17): 10 mg via ORAL
  Administered 2015-12-05: 5 mg via ORAL
  Filled 2015-11-25 (×15): qty 2
  Filled 2015-11-25: qty 1
  Filled 2015-11-25 (×8): qty 2

## 2015-11-25 MED ORDER — ACETAMINOPHEN 325 MG PO TABS: 650.0000 mg | ORAL_TABLET | Freq: Four times a day (QID) | ORAL | Status: DC | PRN

## 2015-11-25 NOTE — Progress Notes (Signed)
Versed 2 mg IV given to pt., VSS throughout.  SB noted 48-60.   attempted MRI multiple times without success.  Pt. Unable to remain still for more than 30-60 seconds at a time.  Dr. Cyndy Freeze text paged to make aware.

## 2015-11-25 NOTE — Progress Notes (Signed)
Occupational Therapy Evaluation Patient Details Name: Chad Ball MRN: [id number] DOB: 26-Jul-1945 Today's Date: 11/25/2015    History of Present Illness 70 y.o. male with medical history significant for CAD s/p MI s/p stent , arthritis, GERD, recent diagnosis of melanoma 1 month ago which was to be followed as an outpatient, brought  via [facility]  for furtherworkup of metastatic brain lesions from [place]. CT of the head was performed, revealing multiple intracranial lesions, in the posterior foci. MRI brain confirmed these findings, including  at least 2 separate lesions in the right cerebellum and bilateral temporal lobe lesions. CT of the chest abdomen and pelvis showed 2 low-density nodules in the liver, most likely cysts, and a CT of the chest showed a 2.4 x 1.8 cm left upper lobe nodule, with pathologic lymphadenopathy in the left hilum.   Clinical Impression   Per family, pt was independent with ADL and mobility and drove weekly to see his Mom in [place] and was very active. Pt inconsistently following 1 step commands this pm. Unable to maintain sitting EOB as pt impulsively laid back down. Pt appeared uncomfortable in sitting and stated he was dizzy. Will follow acutely to assist with maximizing functional level of independence and facilitate safe DC to next venue of care. At this time, pt will most likely need SNF for rehab.     Follow Up Recommendations  SNF;Supervision/Assistance - 24 hour    Equipment Recommendations  Other (comment) (TBA)    Recommendations for Other Services       Precautions / Restrictions Precautions Precautions: Fall Restrictions Weight Bearing Restrictions: No      Mobility Bed Mobility Overal bed mobility: Needs Assistance Bed Mobility: Supine to Sit;Sit to Supine     Supine to sit: Mod assist Sit to supine: Min assist   General bed mobility comments: Unable to probelm solve how to sit up EOB  Transfers                 General  transfer comment: Attempted x 2unable to complete due to pt laying back down    Balance Overall balance assessment: Needs assistance   Sitting balance-Leahy Scale: Fair                                      ADL Overall ADL's : Needs assistance/impaired       Grooming Details (indicate cue type and reason): Able to wash face on command but not sustain attention to task                             Functional mobility during ADLs: Moderate assistance (bed mobility only) General ADL Comments: At this time overall total A with bathing and dressing  due to apparent weakness and difficulty following commands     Vision Additional Comments: will assess   Perception     Praxis Praxis Praxis tested?: Deficits Deficits: Initiation;Organization    Pertinent Vitals/Pain Pain Assessment: Faces Faces Pain Scale: Hurts a little bit Pain Location: upon sitting appeared uncomfortable. Pain Descriptors / Indicators: Grimacing Pain Intervention(s): Limited activity within patient's tolerance     Hand Dominance     Extremity/Trunk Assessment Upper Extremity Assessment Upper Extremity Assessment: Generalized weakness   Lower Extremity Assessment Lower Extremity Assessment: Generalized weakness   Cervical / Trunk Assessment Cervical / Trunk Assessment: Normal   Communication  Communication Communication: Expressive difficulties;Receptive difficulties   Cognition Arousal/Alertness: Lethargic Behavior During Therapy: Flat affect;Restless Overall Cognitive Status: Impaired/Different from baseline Area of Impairment: Orientation;Attention;Memory;Following commands;Safety/judgement;Awareness;Problem solving Orientation Level: Disoriented to;Place;Time;Situation Current Attention Level: Focused Memory: Decreased short-term memory Following Commands: Follows one step commands inconsistently Safety/Judgement: Decreased awareness of safety;Decreased awareness of  deficits Awareness: Intellectual Problem Solving: Slow processing;Difficulty sequencing;Decreased initiation;Requires verbal cues;Requires tactile cues  Pt able to state his name. When asked if he knew where he was, he stated " I don't know"     General Comments   Pt was working part time prior to admission    Exercises       Shoulder Instructions      Home Living Family/patient expects to be discharged to:: Unsure                                        Prior Functioning/Environment Level of Independence: Independent        Comments: Was independent until around 1 week ago. Was driving up to [place] to care for his Mom.    OT Diagnosis: Generalized weakness;Cognitive deficits;Altered mental status;Acute pain   OT Problem List: Decreased strength;Decreased activity tolerance;Impaired balance (sitting and/or standing);Decreased cognition;Decreased safety awareness;Pain;Decreased knowledge of use of DME or AE;Decreased coordination   OT Treatment/Interventions: Self-care/ADL training;Therapeutic exercise;DME and/or AE instruction;Therapeutic activities;Patient/family education;Balance training;Cognitive remediation/compensation;Visual/perceptual remediation/compensation    OT Goals(Current goals can be found in the care plan section) Acute Rehab OT Goals Patient Stated Goal: to find out what's going on OT Goal Formulation: With family Time For Goal Achievement: 12/09/15 Potential to Achieve Goals: Fair ADL Goals Pt Will Perform Eating: with set-up;with supervision;sitting Pt Will Perform Grooming: with set-up;with supervision;sitting Pt Will Perform Upper Body Bathing: standing;with min assist Pt Will Transfer to Toilet: with min assist;bedside commode Additional ADL Goal #1: Pt will consistnetly follow 1 step commands during ADL task with min vc in nondistracting environment  OT Frequency: Min 2X/week   Barriers to D/C:            Co-evaluation               End of Session Nurse Communication: Mobility status  Activity Tolerance: Patient limited by lethargy;Other (comment) (limited by confusion) Patient left:     Time: [id number] OT Time Calculation (min): 18 min Charges:  OT General Charges $OT Visit: 1 Procedure OT Evaluation $OT Eval Moderate Complexity: 1 Procedure G-Codes:    Jerrine Urschel,HILLARY 12-15-15, 4:44 PM   [facility], OTR/L  [phone] 2015/12/15

## 2015-11-25 NOTE — Care Management Important Message (Signed)
Important Message  Patient Details  Name: YURIY BROSEY MRN: [id number] Date of Birth: 1945/05/16   Medicare Important Message Given:  Yes    Loann Quill 11/25/2015, 3:27 PM

## 2015-11-25 NOTE — Progress Notes (Signed)
Spoke with Dr. Thereasa Solo and he agreed to having pt's MRI done under General Anestheisa seeing that pt had 2mg  of Versed and it did not work. MRI to be done on 8/1 @ 10am will notify Rn that pt will need to be NPO at midnight for procedure.

## 2015-11-25 NOTE — Evaluation (Signed)
Clinical/Bedside Swallow Evaluation Patient Details  Name: Chad Ball MRN: [id number] Date of Birth: 1945/10/15  Today's Date: 11/25/2015 Time: SLP Start Time (ACUTE ONLY): 0941 SLP Stop Time (ACUTE ONLY): 0958 SLP Time Calculation (min) (ACUTE ONLY): 17 min  Past Medical History:  Past Medical History:  Diagnosis Date  . Arthritis   . GERD (gastroesophageal reflux disease)   . Myocardial infarction [facility])    Past Surgical History:  Past Surgical History:  Procedure Laterality Date  . APPENDECTOMY    . HERNIA REPAIR     HPI:  70 y.o.malewith a Past Medical History of CAD with remote MI, sp stent, gerd, recent excision of suspected melanoma on his anterior chest about 1 month ago outpt, presented to outside hospital for AMS, found to have multiple intracranial mets and small lung nodule. Pt transferred to [facility] for neurosurgery eval.    Assessment / Plan / Recommendation Clinical Impression  Pt has intermittent, delayed throat clearing with regular solids, with no other overt signs of aspiration or dysphagia noted. Question if this could be related to h/o GERD. Pill administered by RN, whole with thin liquid, without difficulty. Recommend to initiate Dys 3 diet and thin liquids due to fluctuating mentation. Provided education to pt/family about how AMS can impact safety. Will f/u briefly for tolerance. MD, please consider SLP cognitive-linguistic evaluation as well.    Aspiration Risk  Mild aspiration risk    Diet Recommendation Dysphagia 3 (Mech soft);Thin liquid   Liquid Administration via: Cup;Straw Medication Administration: Whole meds with liquid Supervision: Patient able to self feed;Full supervision/cueing for compensatory strategies Compensations: Minimize environmental distractions;Slow rate;Small sips/bites;Follow solids with liquid Postural Changes: Seated upright at 90 degrees;Remain upright for at least 30 minutes after po intake    Other  Recommendations Oral  Care Recommendations: Oral care BID   Follow up Recommendations   (tba)    Frequency and Duration min 1 x/week  1 week       Prognosis Prognosis for Safe Diet Advancement: Fair Barriers to Reach Goals: Cognitive deficits (pending possible intervention for brain masses)      Swallow Study   General HPI: 70 y.o.malewith a Past Medical History of CAD with remote MI, sp stent, gerd, recent excision of suspected melanoma on his anterior chest about 1 month ago outpt, presented to outside hospital for AMS, found to have multiple intracranial mets and small lung nodule. Pt transferred to [facility] for neurosurgery eval.  Type of Study: Bedside Swallow Evaluation Previous Swallow Assessment: none in chart Diet Prior to this Study: NPO Temperature Spikes Noted: No Respiratory Status: Room air History of Recent Intubation: No Behavior/Cognition: Alert;Cooperative;Requires cueing Oral Care Completed by SLP: No Oral Cavity - Dentition: Adequate natural dentition Vision: Functional for self-feeding Self-Feeding Abilities: Able to feed self Patient Positioning: Upright in bed Baseline Vocal Quality: Normal    Oral/Motor/Sensory Function     Ice Chips Ice chips: Not tested   Thin Liquid Thin Liquid: Within functional limits Presentation: Cup;Self Fed;Straw    Nectar Thick Nectar Thick Liquid: Not tested   Honey Thick Honey Thick Liquid: Not tested   Puree Puree: Within functional limits Presentation: Self Fed;Spoon   Solid   GO   Solid: Impaired Presentation: Self Fed Pharyngeal Phase Impairments: Throat Clearing - Delayed        Germain Osgood 11/25/2015,10:07 AM  Germain Osgood, M.A. CCC-SLP [phone]

## 2015-11-25 NOTE — Progress Notes (Signed)
[place] TEAM 1 - Stepdown/ICU TEAM  Chad Ball  [id number] DOB: [birth year] DOA: 11/24/2015 PCP: No primary care provider on file.    Brief Narrative:  70 y.o. male with history of CAD w/ MI s/p stent, arthritis, GERD, and a diagnosis of melanoma 1 month prior which was to be followed as an outpatient who was brought to [facility] via [facility]  for furtherworkup of suspected metastatic brain lesions.  The patient had an episode of acute confusion while visiting a relative in [place], and had been experiencing severe, constant headaches around the temporal and frontal area for about one week.   Upon admission to [facility], the patient was incoherent.  A CT of the head revealed multiple intracranial lesions. MRI brain confirmed these findings, including at least 2 separate lesions in the right cerebellum and bilateral temporal lobe lesions. CT of the chest abdomen and pelvis showed 2 low-density nodules in the liver, most likely cysts, and a CT of the chest showed a 2.4 x 1.8 cm left upper lobe nodule, with pathologic lymphadenopathy in the left hilum.  He received IV steroids with some improvement in his mental status. Because he lives in [place], he was transferred to [facility] for Neurosurgical evaluation in order to obtain the proper tissue diagnosis for treatment options.   Subjective: The pt is awake but confused.  He is not able to provide a reliable history.  He does not appear to be in acute respiratory distress or in significant pain.  I have spoken to his daughter and his wife at bedside at length.  Assessment & Plan:  Metastatic Brain lesions suspected metastatic melanoma - to have detailed MRI head w/ anesthesia in AM to allow planning of neurosurgical resection/biopsy - cont decadron and AED prophylaxis   CAD - s/p stent Holding ASA for now - otherwise appears stable presently   Hyperlipidemia Continue home statin  GERD Continue PPI  DVT  prophylaxis: SCDs Code Status: FULL CODE Family Communication: Spoke with wife and daughter at bedside at length Disposition Plan: SDU   Consultants:  NS  Procedures: none  Antimicrobials:  none   Objective: Blood pressure (!) 153/82, pulse (!) 53, temperature 97.7 F (36.5 C), temperature source Axillary, resp. rate 16, height 6\' 1"  (1.854 m), weight 76 kg (167 lb 8.8 oz), SpO2 95 %.  Intake/Output Summary (Last 24 hours) at 11/25/15 1625 Last data filed at 11/25/15 1047  Gross per 24 hour  Intake              265 ml  Output              250 ml  Net               15 ml   Filed Weights   11/24/15 1622  Weight: 76 kg (167 lb 8.8 oz)    Examination: General: No acute respiratory distress Lungs: Clear to auscultation bilaterally without wheezes or crackles Cardiovascular: Regular rate and rhythm without murmur gallop or rub normal S1 and S2 Abdomen: Nontender, nondistended, soft, bowel sounds positive, no rebound, no ascites, no appreciable mass Extremities: No significant cyanosis, clubbing, or edema bilateral lower extremities  CBC:  Recent Labs Lab 11/24/15 1649 11/25/15 0450  WBC 23.9* 18.9*  NEUTROABS 21.9*  --   HGB 15.4 15.4  HCT 45.8 46.2  MCV 91.1 92.4  PLT 324 [id number]   Basic Metabolic Panel:  Recent Labs Lab 11/24/15 1649 11/25/15 0450  NA 136 138  K 3.7 3.6  CL 102 103  CO2 25 26  GLUCOSE 148* 150*  BUN 26* 26*  CREATININE 0.93 0.92  CALCIUM 9.4 9.3   GFR: Estimated Creatinine Clearance: 81.5 mL/min (by C-G formula based on SCr of 0.92 mg/dL).  Liver Function Tests:  Recent Labs Lab 11/24/15 1649  AST 32  ALT 28  ALKPHOS 81  BILITOT 1.3*  PROT 7.2  ALBUMIN 3.8    Coagulation Profile:  Recent Labs Lab 11/24/15 1649  INR 1.04   HbA1C: Hgb A1c MFr Bld  Date/Time Value Ref Range Status  11/24/2015 05:30 AM 5.4 4.8 - 5.6 % Final    Comment:    (NOTE)         Pre-diabetes: 5.7 - 6.4         Diabetes: >6.4         Glycemic  control for adults with diabetes: <7.0     CBG:  Recent Labs Lab 11/25/15 0428  GLUCAP 159*    Recent Results (from the past 240 hour(s))  MRSA PCR Screening     Status: None   Collection Time: 11/24/15  4:23 PM  Result Value Ref Range Status   MRSA by PCR NEGATIVE NEGATIVE Final    Comment:        The GeneXpert MRSA Assay (FDA approved for NASAL specimens only), is one component of a comprehensive MRSA colonization surveillance program. It is not intended to diagnose MRSA infection nor to guide or monitor treatment for MRSA infections.      Scheduled Meds: . atorvastatin  80 mg Oral Daily  . dexamethasone  4 mg Intravenous Q6H  . levETIRAcetam  750 mg Intravenous Q12H  . losartan  25 mg Oral Daily  . metoprolol tartrate  12.5 mg Oral BID  . multivitamin with minerals  1 tablet Oral Daily  . pantoprazole  40 mg Oral Daily  . sodium chloride flush  3 mL Intravenous Q12H     LOS: 1 day   Cherene Altes, MD [facility] Office  [phone] Pager - Text Page per Amion as per below:  On-Call/Text Page:      [email or web address]      password [email or web address]  If 7PM-7AM, please contact night-coverage [email or web address] Password [email or web address] 11/25/2015, 4:25 PM

## 2015-11-25 NOTE — Consult Note (Signed)
CC:  Speech difficulty, imbalance  HPI: Chad Ball is a 70 y.o. male transferred from [place] with speech difficulty and imbalance. His history begins about a week ago when he began c/o HA which is unusual for him. The HA progressively worsened over the week. He went to [facility] to help take care of his mother and on the phone with his wife he was incomprehensible. She drove to [place] and found him to be unable to speak intelligibly, and with poor balance having a hard time walking. He was admitted to the hospital there where MRI was attempted and only non-contrast portion was completed which per report demonstrated multiple intracranial lesions including a large left temporal mass and a large right-sided mass, in addition to multiple smaller lesions. 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 No other hx of malignancy.  PMH: Past Medical History:  Diagnosis Date  . Arthritis   . GERD (gastroesophageal reflux disease)   . Myocardial infarction [facility])     PSH: Past Surgical History:  Procedure Laterality Date  . APPENDECTOMY    . HERNIA REPAIR      SH: Social History  Substance Use Topics  . Smoking status: Never Smoker  . Smokeless tobacco: Not on file  . Alcohol use Not on file    MEDS: Prior to Admission medications   Medication Sig Start Date End Date Taking? Authorizing Provider  acetaminophen (TYLENOL) 325 MG tablet Take 650 mg by mouth every 6 (six) hours as needed for moderate pain or headache.    Yes Historical Provider, MD  aspirin 81 MG tablet Take 81 mg by mouth daily.   Yes Historical Provider, MD  atorvastatin (LIPITOR) 80 MG tablet Take 80 mg by mouth daily at 6 PM.    Yes Historical Provider, MD  dexamethasone (DECADRON) 4 MG tablet Take 4 mg by mouth 4 (four) times daily.   Yes Historical Provider, MD  levETIRAcetam (KEPPRA) 500 MG tablet Take 500 mg by mouth 2 (two) times daily.   Yes  Historical Provider, MD  losartan (COZAAR) 25 MG tablet Take 12.5 mg by mouth daily.    Yes Historical Provider, MD  metoprolol tartrate (LOPRESSOR) 25 MG tablet Take 12.5 mg by mouth 2 (two) times daily.   Yes Historical Provider, MD  Multiple Vitamin (MULTIVITAMIN WITH MINERALS) TABS tablet Take 1 tablet by mouth daily.   Yes Historical Provider, MD  nitroGLYCERIN (NITROSTAT) 0.4 MG SL tablet Place 0.4 mg under the tongue every 5 (five) minutes as needed for chest pain.   Yes Historical Provider, MD  omeprazole (PRILOSEC) 20 MG capsule Take 20 mg by mouth daily.   Yes Historical Provider, MD    ALLERGY: Allergies  Allergen Reactions  . Other Anaphylaxis, Shortness Of Breath and Swelling    [place] nuts    ROS: ROS  NEUROLOGIC EXAM: Awake, alert EOMI Face symmetric Tongue midline Speech broken, unable to name objects, unable to repeat phrases Will follow simple commands Moves all extremities, appears symmetric. Unable to test pronator drift. Unable to reliably test sensation  IMPRESSION: - 70 y.o. 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  PLAN: - Will need MRI brain w/w/o Gad, scheduled tomorrow with anesthesia

## 2015-11-25 NOTE — Care Management Note (Signed)
Case Management Note  Patient Details  Name: [name] MRN: [id number] Date of Birth: Sep 03, 1945  Subjective/Objective:  Patient lives in [place] with spouse, but takes care of his mother in  In [place] 3 times a week. He presents with intracranial mass, and left upper lung nodule, for MRI, then mapping to see what surgical interventions are needed.  NCM will cont to follow for dc needs.                  Action/Plan:   Expected Discharge Date:                  Expected Discharge Plan:  [place]  In-House Referral:     Discharge planning Services     Post Acute Care Choice:    Choice offered to:     DME Arranged:    DME Agency:     HH Arranged:    [place] Agency:     Status of Service:  In process, will continue to follow  If discussed at Long Length of Stay Meetings, dates discussed:    Additional Comments:  Zenon Mayo, RN 11/25/2015, 4:19 PM

## 2015-11-25 NOTE — Progress Notes (Signed)
Physician notified: Pool (page sent) At: 1330  Regarding:  Family at bedside, awaiting to speak w neurosx, awaiting plan for MR since unable to complete d/t pt movement.

## 2015-11-25 NOTE — Progress Notes (Signed)
PT Cancellation Note  Patient Details Name: Chad Ball MRN: [id number] DOB: [birth year]   Cancelled Treatment:    Reason Eval/Treat Not Completed: Other (comment).  Pt just finished working with OT.  Per OT, pt was not able to do much with her and she recommended trying back tomorrow as there may be more of a plan in place for him (neurosurgery consulting).  PT will check back tomorrow.    Thanks,    Barbarann Ehlers. [place], [place], DPT [phone]   11/25/2015, 10:35 PM

## 2015-11-26 ENCOUNTER — Encounter (HOSPITAL_COMMUNITY): Payer: Self-pay | Admitting: Anesthesiology

## 2015-11-26 ENCOUNTER — Encounter (HOSPITAL_COMMUNITY)
Admission: AD | Disposition: A | Discharge status: Skilled Nursing Facility | Payer: Self-pay | Source: Other Acute Inpatient Hospital | Attending: Internal Medicine

## 2015-11-26 ENCOUNTER — Inpatient Hospital Stay (HOSPITAL_COMMUNITY): Payer: Medicare Other | Admitting: Certified Registered Nurse Anesthetist

## 2015-11-26 ENCOUNTER — Inpatient Hospital Stay (HOSPITAL_COMMUNITY): Payer: Medicare Other

## 2015-11-26 DIAGNOSIS — D496 Neoplasm of unspecified behavior of brain: Secondary | ICD-10-CM | POA: Diagnosis present

## 2015-11-26 DIAGNOSIS — C439 Malignant melanoma of skin, unspecified: Secondary | ICD-10-CM | POA: Diagnosis present

## 2015-11-26 DIAGNOSIS — C799 Secondary malignant neoplasm of unspecified site: Secondary | ICD-10-CM | POA: Diagnosis present

## 2015-11-26 HISTORY — PX: RADIOLOGY WITH ANESTHESIA: SHX6223

## 2015-11-26 LAB — GLUCOSE, CAPILLARY: GLUCOSE-CAPILLARY: 154 mg/dL — AB (ref 65–99)

## 2015-11-26 SURGERY — RADIOLOGY WITH ANESTHESIA
Anesthesia: General

## 2015-11-26 MED ORDER — GADOBENATE DIMEGLUMINE 529 MG/ML IV SOLN
19.0000 mL | Freq: Once | INTRAVENOUS | Status: AC | PRN
  Administered 2015-11-26: 19 mL via INTRAVENOUS

## 2015-11-26 MED ORDER — LACTATED RINGERS IV SOLN: INTRAVENOUS | Status: DC

## 2015-11-26 MED ORDER — PROMETHAZINE HCL 25 MG/ML IJ SOLN: 6.2500 mg | INTRAMUSCULAR | Status: DC | PRN

## 2015-11-26 MED ORDER — DEXTROSE-NACL 5-0.9 % IV SOLN
INTRAVENOUS | Status: DC
  Administered 2015-11-26: 22:00:00 via INTRAVENOUS
  Administered 2015-11-27: 1000 mL via INTRAVENOUS
  Administered 2015-11-28 – 2015-12-02 (×3): via INTRAVENOUS
  Administered 2015-12-04: 1000 mL via INTRAVENOUS
  Administered 2015-12-05: 11:00:00 via INTRAVENOUS

## 2015-11-26 MED ORDER — INSULIN ASPART 100 UNIT/ML ~~LOC~~ SOLN
0.0000 [IU] | SUBCUTANEOUS | Status: DC
  Administered 2015-11-27: 1 [IU] via SUBCUTANEOUS

## 2015-11-26 MED ORDER — FENTANYL CITRATE (PF) 100 MCG/2ML IJ SOLN: 25.0000 ug | INTRAMUSCULAR | Status: DC | PRN

## 2015-11-26 NOTE — Transfer of Care (Signed)
Immediate Anesthesia Transfer of Care Note  Patient: Chad Ball  Procedure(s) Performed: Procedure(s): MRI BRAIN WITH WITH AND WITHOUT CONTRAST (N/A)  Patient Location: PACU  Anesthesia Type:General  Level of Consciousness: sedated, confused and responds to stimulation  Airway & Oxygen Therapy: Patient Spontanous Breathing and Patient connected to face mask oxygen  Post-op Assessment: Report given to RN and Post -op Vital signs reviewed and stable  Post vital signs: Reviewed and stable  Last Vitals:  Vitals:   11/26/15 0800 11/26/15 1455  BP:  138/90  Pulse: (!) 56 82  Resp: 12   Temp:  36.9 C    Last Pain:  Vitals:   11/26/15 0700  TempSrc: Oral  PainSc:          Complications: No apparent anesthesia complications

## 2015-11-26 NOTE — Progress Notes (Signed)
Paged Dr Christella Noa per Dr Sherral Hammers concerning pt's MRI results. Consuelo Pandy RN

## 2015-11-26 NOTE — Progress Notes (Addendum)
PT Cancellation Note  Patient Details Name: SHAKEEL DRZEWIECKI MRN: [id number] DOB: [birth year]   Cancelled Treatment:    Reason Eval/Treat Not Completed: Patient at procedure or test/unavailable.  Pt at MRI under anesthesia.  PT will follow up later today or tomorrow.    Thanks,    Barbarann Ehlers. [place], [place], DPT [phone]   11/26/2015, 1:27 PM

## 2015-11-26 NOTE — Anesthesia Procedure Notes (Signed)
Procedure Name: LMA Insertion Date/Time: 11/26/2015 1:23 PM Performed by: Jacquiline Doe A Pre-anesthesia Checklist: Patient identified, Emergency Drugs available, Suction available and Patient being monitored Patient Re-evaluated:Patient Re-evaluated prior to inductionOxygen Delivery Method: Circle System Utilized Preoxygenation: Pre-oxygenation with 100% oxygen Intubation Type: IV induction Ventilation: Mask ventilation without difficulty LMA: LMA inserted LMA Size: 4.0 Tube type: Oral Number of attempts: 1 Placement Confirmation: positive ETCO2 and breath sounds checked- equal and bilateral Tube secured with: Tape Dental Injury: Teeth and Oropharynx as per pre-operative assessment

## 2015-11-26 NOTE — Anesthesia Preprocedure Evaluation (Addendum)
Anesthesia Evaluation  Patient identified by MRN, date of birth, ID band Patient awake    Reviewed: Allergy & Precautions, H&P , NPO status , Patient's Chart, lab work & pertinent test results  Airway Mallampati: II  TM Distance: >3 FB Neck ROM: Full    Dental no notable dental hx. (+) Dental Advisory Given, Teeth Intact   Pulmonary neg pulmonary ROS,    Pulmonary exam normal breath sounds clear to auscultation       Cardiovascular + CAD, + Past MI and + Cardiac Stents  Normal cardiovascular exam Rhythm:Regular Rate:Normal     Neuro/Psych Brain tumor - met from melanoma negative psych ROS   GI/Hepatic negative GI ROS, Neg liver ROS,   Endo/Other  negative endocrine ROS  Renal/GU negative Renal ROS  negative genitourinary   Musculoskeletal negative musculoskeletal ROS (+)   Abdominal   Peds negative pediatric ROS (+)  Hematology negative hematology ROS (+)   Anesthesia Other Findings   Reproductive/Obstetrics negative OB ROS                           Anesthesia Physical Anesthesia Plan  ASA: III  Anesthesia Plan: General   Post-op Pain Management:    Induction: Intravenous  Airway Management Planned: LMA and Oral ETT  Additional Equipment:   Intra-op Plan:   Post-operative Plan: Extubation in OR  Informed Consent: I have reviewed the patients History and Physical, chart, labs and discussed the procedure including the risks, benefits and alternatives for the proposed anesthesia with the patient or authorized representative who has indicated his/her understanding and acceptance.   Dental advisory given  Plan Discussed with: CRNA and Surgeon  Anesthesia Plan Comments:         Anesthesia Quick Evaluation                                   Anesthesia Evaluation  Patient identified by MRN, date of birth, ID band Patient awake    Reviewed: Allergy & Precautions, NPO  status , Patient's Chart, lab work & pertinent test results  Airway Mallampati: II  TM Distance: >3 FB Neck ROM: Full    Dental no notable dental hx.    Pulmonary neg pulmonary ROS,    Pulmonary exam normal breath sounds clear to auscultation       Cardiovascular + CAD and + Past MI  Normal cardiovascular exam Rhythm:Regular Rate:Normal     Neuro/Psych negative neurological ROS  negative psych ROS   GI/Hepatic negative GI ROS, Neg liver ROS,   Endo/Other  negative endocrine ROS  Renal/GU negative Renal ROS  negative genitourinary   Musculoskeletal negative musculoskeletal ROS (+)   Abdominal   Peds negative pediatric ROS (+)  Hematology negative hematology ROS (+)   Anesthesia Other Findings   Reproductive/Obstetrics negative OB ROS                             Anesthesia Physical Anesthesia Plan  ASA: III  Anesthesia Plan: General   Post-op Pain Management:    Induction: Intravenous  Airway Management Planned: LMA and Oral ETT  Additional Equipment:   Intra-op Plan:   Post-operative Plan: Extubation in OR  Informed Consent: I have reviewed the patients History and Physical, chart, labs and discussed the procedure including the risks, benefits and alternatives for the proposed anesthesia with  the patient or authorized representative who has indicated his/her understanding and acceptance.   Dental advisory given  Plan Discussed with: CRNA and Surgeon  Anesthesia Plan Comments:         Anesthesia Quick Evaluation

## 2015-11-26 NOTE — Anesthesia Postprocedure Evaluation (Signed)
Anesthesia Post Note  Patient: Chad Ball  Procedure(s) Performed: Procedure(s) (LRB): MRI BRAIN WITH WITH AND WITHOUT CONTRAST (N/A)  Patient location during evaluation: PACU Anesthesia Type: General Level of consciousness: awake and alert Pain management: pain level controlled Vital Signs Assessment: post-procedure vital signs reviewed and stable Respiratory status: spontaneous breathing, nonlabored ventilation, respiratory function stable and patient connected to nasal cannula oxygen Cardiovascular status: blood pressure returned to baseline and stable Postop Assessment: no signs of nausea or vomiting Anesthetic complications: no    Last Vitals:  Vitals:   11/26/15 1510 11/26/15 1520  BP: 128/86 136/87  Pulse: 75 70  Resp: 13 10  Temp:  36.5 C    Last Pain:  Vitals:   11/26/15 1520  TempSrc: Oral  PainSc: Asleep                 Lenis Nettleton L

## 2015-11-26 NOTE — Progress Notes (Signed)
SLP Cancellation Note  Patient Details Name: Chad Ball MRN: [id number] DOB: July 08, 1945   Cancelled treatment:       Reason Eval/Treat Not Completed: Medical issues which prohibited therapy. Pt NPO this morning - per chart review, plan is for MRI to be completed under anesthesia. Will f/u as able.   Germain Osgood 11/26/2015, 9:02 AM  Germain Osgood, M.A. CCC-SLP [phone]

## 2015-11-26 NOTE — Progress Notes (Signed)
PROGRESS NOTE    Chad Ball  [id number] DOB: 08-Mar-1946 DOA: 11/24/2015 PCP: No primary care provider on file.   Brief Narrative:  70 y.o. WM PMHx CAD, S/P MI s/p stent , Arthritis, GERD, Dx Melanoma in June 2017 with removal by dermatologist in [place]. was to be followed as an outpatient.  Brought  via [facility]  for furtherworkup of metastatic brain lesions. In review, the patient had an episode of acute confusion was visiting his relative in [place]. In addition, he was experiencing severe, constant headaches around the temporal and frontal area, for about one week prior to presentation. Upon admission to [facility], the patient was incoherent, unable to articulate words.this weeks, he had no other or abuse symptoms such as dizziness, nausea, vomiting or vision changes. At the hospital, his CT of the head was performed, revealing multiple intracranial lesions, in the posterior foci. MRI brain confirmed these findings, including  at least 2 separate lesions in the right cerebellum and bilateral temporal lobe lesions. CT of the chest abdomen and pelvis showed 2 low-density nodules in the liver, most likely cysts, and a CT of the chest showed a 2.4 x 1.8 cm left upper lobe nodule, with pathologic lymphadenopathy in the left hilum.  He received IV steroids with some improvement of his mental status. Because he lives in [place], he was transferred to [facility] for Neurosurgical evaluation in order to obtain the proper tissue diagnosis for treatment options. Patient is currently alert, but unable to engage in conversation, he follows very simple, and. According to his wife, he is more interactive since the initiation of steroids.    Subjective: 8/1  A/O 1 (does not know where, when, why), NAD.    Assessment & Plan:   Active Problems:   Essential hypertension   Hyperlipidemia   CAD in native artery   Brain mass   Melanoma of skin (HCC)   Brain  metastasis ([place])   Brain tumor ([place])   Metastatic melanoma ([place])   Metastatic Brain lesions,  -suspected metastatic melanoma, in view of recent diagnosis per dermatologist.  -S/P MRI 8/1 findings characteristic of metastatic melanoma, spoke with Dr. Alen Blew who agreed no role for medical oncology. Patient would require radiation oncology -Have paged radiation oncology 3 times without answer. Contact in a.m. -Neurosurgery to see patient this evening  -CT chest/abdomen and pelvis pending -Decadron 4 mg QID -Keppra 750 mg BID (started?) -NPO: D5- 0.9% saline 55ml/hr -Sensitive SSI  CAD,s/p stent   - patient appears  cardiac pain free at this time. -Patient not on Plavix by admission MAR - Hold Plavix and  ASA as some of his brain lesions are bleeding.    HTN -Losartan 25 mg daily   Hyperlipidemia -Lipitor 80 mg daily    Leukocytosis,  -likely reactive, related to steroids WBC 13 Afebrile   IVF  Will hold antibiotics for now  Repeat CBC in AM  GERD, no acute symptoms: Continue PPI   DVT prophylaxis: SCD Code Status: Full Family Communication: Whole family present, reviewed MRI with family in detail Disposition Plan: Await neurosurgery input most likely palliative care/hospice. Awaiting callback from radiation oncology   Consultants:  Neurosurgery pending Radiation oncology pending  Procedures/Significant Events:  MRA head W/WO contrast:Numerous intracranial masses within the supratentorial and infratentorial region numbering approximately 25 containing blood breakdown products or melanin suggestive of metastatic melanoma given patient's history.  Some of the larger lesions include:  Right anterior frontal 4.8 x 4.9 x 5 cm  mass with marked surrounding vasogenic edema. Cannot exclude extension into the superior right orbital region as bone of the orbital roof may be destroyed. This causes significant mass effect upon the right frontal horn of the lateral  ventricular system which is displaced posteriorly and to the right.  Left temporal lobe 4.8 x 3.2 x 3.1 cm mass with marked surrounding vasogenic edema and flattening of the left temporal horn.  Superior right cerebellar 1.8 cm and 1.4 cm mass with surrounding vasogenic edema and mild compression the right lateral aspect of the fourth ventricle.  Blood or proteinaceous material layering within the deep dependent aspect of the lateral ventricles suggesting breakthrough of hemorrhagic metastatic lesion into the ventricular system. Primary intracranial hemorrhage is a secondary less likely consideration.  Abnormal appearance of the sulci on FLAIR imaging may be related to oxygen saturation secondary to the fact this was performed under general anesthesia limiting detection of the possibility of subarachnoid hemorrhage.  In addition to significant distortion of the lateral ventricle by metastatic disease, mild hydrocephalus may be present.  Cultures None  Antimicrobials: None   Devices None   LINES / TUBES:  None    Continuous Infusions: . dextrose 5 % and 0.9% NaCl       Objective: Vitals:   11/26/15 1455 11/26/15 1510 11/26/15 1520 11/26/15 2010  BP: 138/90 128/86 136/87 140/84  Pulse: 82 75 70 75  Resp: 14 13 10 19   Temp: 98.4 F (36.9 C)  97.7 F (36.5 C) 98 F (36.7 C)  TempSrc:   Oral Oral  SpO2: 98% 97% 95% 90%  Weight:      Height:        Intake/Output Summary (Last 24 hours) at 11/26/15 2116 Last data filed at 11/26/15 2011  Gross per 24 hour  Intake                0 ml  Output              725 ml  Net             -725 ml   Filed Weights   11/24/15 1622 11/26/15 1153  Weight: 76 kg (167 lb 8.8 oz) 81.6 kg (180 lb)    Examination:  General:A/O 1 (does not know where, when, why), No acute respiratory distress Eyes: negative scleral hemorrhage, negative anisocoria, negative icterus ENT: Negative Runny nose, negative gingival  bleeding, Neck:  Negative scars, masses, torticollis, lymphadenopathy, JVD Lungs: Clear to auscultation bilaterally without wheezes or crackles Cardiovascular: Regular rate and rhythm without murmur gallop or rub normal S1 and S2 Abdomen: negative abdominal pain, nondistended, positive soft, bowel sounds, no rebound, no ascites, no appreciable mass Extremities: No significant cyanosis, clubbing, or edema bilateral lower extremities Skin: Negative rashes, lesions, ulcers Psychiatric:  Unable to assess  Central nervous system:  Cranial nerves II through XII intact, tongue/uvula midline, patient follow commands negative dysarthria, negative expressive aphasia, negative receptive aphasia. Unable to assess the remainder of exam  .     Data Reviewed: Care during the described time interval was provided by me .  I have reviewed this patient's available data, including medical history, events of note, physical examination, and all test results as part of my evaluation. I have personally reviewed and interpreted all radiology studies.  CBC:  Recent Labs Lab 11/24/15 1649 11/25/15 0450  WBC 23.9* 18.9*  NEUTROABS 21.9*  --   HGB 15.4 15.4  HCT 45.8 46.2  MCV 91.1 92.4  PLT 324  [id number]   Basic Metabolic Panel:  Recent Labs Lab 11/24/15 1649 11/25/15 0450  NA 136 138  K 3.7 3.6  CL 102 103  CO2 25 26  GLUCOSE 148* 150*  BUN 26* 26*  CREATININE 0.93 0.92  CALCIUM 9.4 9.3   GFR: Estimated Creatinine Clearance: 85.6 mL/min (by C-G formula based on SCr of 0.92 mg/dL). Liver Function Tests:  Recent Labs Lab 11/24/15 1649  AST 32  ALT 28  ALKPHOS 81  BILITOT 1.3*  PROT 7.2  ALBUMIN 3.8   No results for input(s): LIPASE, AMYLASE in the last 168 hours. No results for input(s): AMMONIA in the last 168 hours. Coagulation Profile:  Recent Labs Lab 11/24/15 1649  INR 1.04   Cardiac Enzymes: No results for input(s): CKTOTAL, CKMB, CKMBINDEX, TROPONINI in the last 168  hours. BNP (last 3 results) No results for input(s): PROBNP in the last 8760 hours. HbA1C:  Recent Labs  11/24/15 0530  HGBA1C 5.4   CBG:  Recent Labs Lab 11/25/15 0428  GLUCAP 159*   Lipid Profile: No results for input(s): CHOL, HDL, LDLCALC, TRIG, CHOLHDL, LDLDIRECT in the last 72 hours. Thyroid Function Tests:  Recent Labs  11/24/15 1649  TSH 0.924   Anemia Panel: No results for input(s): VITAMINB12, FOLATE, FERRITIN, TIBC, IRON, RETICCTPCT in the last 72 hours. Urine analysis:    Component Value Date/Time   BILIRUBINUR large (A) 09/02/2015 1054   KETONESUR small (15) (A) 09/02/2015 1054   PROTEINUR =100 (A) 09/02/2015 1054   UROBILINOGEN 1.0 09/02/2015 1054   NITRITE Positive (A) 09/02/2015 1054   LEUKOCYTESUR Trace (A) 09/02/2015 1054   Sepsis Labs: @LABRCNTIP (procalcitonin:4,lacticidven:4)  ) Recent Results (from the past 240 hour(s))  MRSA PCR Screening     Status: None   Collection Time: 11/24/15  4:23 PM  Result Value Ref Range Status   MRSA by PCR NEGATIVE NEGATIVE Final    Comment:        The GeneXpert MRSA Assay (FDA approved for NASAL specimens only), is one component of a comprehensive MRSA colonization surveillance program. It is not intended to diagnose MRSA infection nor to guide or monitor treatment for MRSA infections.          Radiology Studies: Mr [name] Wo Contrast  Result Date: 11/26/2015 CLINICAL DATA:  70 year old male with speech difficulty and imbalance. Symptoms started 1 week ago complaining of headache. History melanoma removed 1 month ago. Abnormal imaging at [facility] in [place]. Initial encounter. EXAM: MRI HEAD WITHOUT AND WITH CONTRAST TECHNIQUE: Multiplanar, multiecho pulse sequences of the brain and surrounding structures were obtained without and with intravenous contrast. CONTRAST:  68mL MULTIHANCE GADOBENATE DIMEGLUMINE 529 MG/ML IV SOLN COMPARISON:  No comparison exams available. FINDINGS: Exam  was performed under general anesthesia. Numerous intracranial masses within the supratentorial and infratentorial region numbering approximately 25 containing blood breakdown products or melanin suggestive of metastatic melanoma given patient's history. Some of the larger lesions include: Right anterior frontal 4.8 x 4.9 x 5 cm mass with marked surrounding vasogenic edema. Cannot exclude extension into the superior right orbital region as bone of the orbital roof may be destroyed. This causes significant mass effect upon the right frontal horn of the lateral ventricular system which is displaced posteriorly and to the right. Left temporal lobe 4.8 x 3.2 x 3.1 cm mass with marked surrounding vasogenic edema and flattening of the left temporal horn. Superior right cerebellar 1.8 cm and 1.4 cm mass with surrounding vasogenic edema and mild compression the  right lateral aspect of the fourth ventricle. Blood or proteinaceous material layering within the deep dependent aspect of the lateral ventricles suggesting breakthrough of hemorrhagic metastatic lesion into the ventricular system. Primary intracranial hemorrhage is a secondary less likely consideration. Abnormal appearance of the sulci on FLAIR imaging may be related to oxygen saturation secondary to the fact this was performed under general anesthesia limiting detection of the possibility of subarachnoid hemorrhage. In addition to significant distortion of the lateral ventricle by metastatic disease, mild hydrocephalus may be present. Major intracranial vascular structures are patent. No acute thrombotic infarct. IMPRESSION: Numerous intracranial masses within the supratentorial and infratentorial region numbering approximately 25 containing blood breakdown products or melanin suggestive of metastatic melanoma given patient's history. Some of the larger lesions include: Right anterior frontal 4.8 x 4.9 x 5 cm mass with marked surrounding vasogenic edema. Cannot  exclude extension into the superior right orbital region as bone of the orbital roof may be destroyed. This causes significant mass effect upon the right frontal horn of the lateral ventricular system which is displaced posteriorly and to the right. Left temporal lobe 4.8 x 3.2 x 3.1 cm mass with marked surrounding vasogenic edema and flattening of the left temporal horn. Superior right cerebellar 1.8 cm and 1.4 cm mass with surrounding vasogenic edema and mild compression the right lateral aspect of the fourth ventricle. Blood or proteinaceous material layering within the deep dependent aspect of the lateral ventricles suggesting breakthrough of hemorrhagic metastatic lesion into the ventricular system. Primary intracranial hemorrhage is a secondary less likely consideration. Abnormal appearance of the sulci on FLAIR imaging may be related to oxygen saturation secondary to the fact this was performed under general anesthesia limiting detection of the possibility of subarachnoid hemorrhage. In addition to significant distortion of the lateral ventricle by metastatic disease, mild hydrocephalus may be present. These results will be called to the ordering clinician or representative by the Radiologist Assistant, and communication documented in the PACS or zVision Dashboard. Electronically Signed   By: Genia Del M.D.   On: 11/26/2015 15:32        Scheduled Meds: . atorvastatin  80 mg Oral Daily  . carvedilol  3.125 mg Oral BID WC  . dexamethasone  4 mg Intravenous Q6H  . insulin aspart  0-9 Units Subcutaneous Q4H  . levETIRAcetam  750 mg Intravenous Q12H  . losartan  25 mg Oral Daily  . multivitamin with minerals  1 tablet Oral Daily  . pantoprazole  40 mg Oral Daily  . sodium chloride flush  3 mL Intravenous Q12H   Continuous Infusions: . dextrose 5 % and 0.9% NaCl       LOS: 2 days    Time spent: 40 minutes    Keymani Glynn, Geraldo Docker, MD [facility] Pager [phone]   If 7PM-7AM,  please contact night-coverage [email or web address] Password [facility] 11/26/2015, 9:16 PM

## 2015-11-27 ENCOUNTER — Inpatient Hospital Stay (HOSPITAL_COMMUNITY): Payer: Medicare Other

## 2015-11-27 ENCOUNTER — Encounter (HOSPITAL_COMMUNITY): Payer: Self-pay | Admitting: Radiology

## 2015-11-27 DIAGNOSIS — D496 Neoplasm of unspecified behavior of brain: Secondary | ICD-10-CM

## 2015-11-27 LAB — GLUCOSE, CAPILLARY
GLUCOSE-CAPILLARY: 143 mg/dL — AB (ref 65–99)
GLUCOSE-CAPILLARY: 168 mg/dL — AB (ref 65–99)
GLUCOSE-CAPILLARY: 136 mg/dL — AB (ref 65–99)
Glucose-Capillary: 144 mg/dL — ABNORMAL HIGH (ref 65–99)
Glucose-Capillary: 134 mg/dL — ABNORMAL HIGH (ref 65–99)

## 2015-11-27 MED ORDER — INSULIN ASPART 100 UNIT/ML ~~LOC~~ SOLN
0.0000 [IU] | SUBCUTANEOUS | Status: DC
  Administered 2015-11-27: 2 [IU] via SUBCUTANEOUS
  Administered 2015-11-27 – 2015-11-30 (×17): 1 [IU] via SUBCUTANEOUS

## 2015-11-27 MED ORDER — DIATRIZOATE MEGLUMINE & SODIUM 66-10 % PO SOLN
ORAL | Status: AC
  Filled 2015-11-27: qty 30

## 2015-11-27 MED ORDER — IOPAMIDOL (ISOVUE-300) INJECTION 61%
INTRAVENOUS | Status: AC
  Administered 2015-11-27: 13:00:00
  Filled 2015-11-27: qty 100

## 2015-11-27 MED ORDER — FAMOTIDINE IN NACL 20-0.9 MG/50ML-% IV SOLN
20.0000 mg | Freq: Two times a day (BID) | INTRAVENOUS | Status: DC
  Administered 2015-11-27 – 2015-11-29 (×4): 20 mg via INTRAVENOUS
  Filled 2015-11-27 (×4): qty 50

## 2015-11-27 MED ORDER — IOPAMIDOL (ISOVUE-300) INJECTION 61%
100.0000 mL | Freq: Once | INTRAVENOUS | Status: AC | PRN
  Administered 2015-11-27: 100 mL via INTRAVENOUS

## 2015-11-27 MED FILL — Lidocaine HCl Local Soln Prefilled Syringe 100 MG/5ML (2%): INTRAMUSCULAR | Qty: 5 | Status: AC

## 2015-11-27 MED FILL — Lactated Ringer's Solution: INTRAVENOUS | Qty: 1000 | Status: AC

## 2015-11-27 MED FILL — Propofol IV Emul 200 MG/20ML (10 MG/ML): INTRAVENOUS | Qty: 20 | Status: AC

## 2015-11-27 MED FILL — Fentanyl Citrate Preservative Free (PF) Inj 100 MCG/2ML: INTRAMUSCULAR | Qty: 2 | Status: AC

## 2015-11-27 MED FILL — Ondansetron HCl Inj 4 MG/2ML (2 MG/ML): INTRAMUSCULAR | Qty: 2 | Status: AC

## 2015-11-27 NOTE — Progress Notes (Signed)
[place] TEAM 1 - Stepdown/ICU TEAM  Chad Ball  [id number] DOB: May 01, 1945 DOA: 11/24/2015 PCP: No primary care provider on file.    Brief Narrative:  70 y.o. male with history of CAD w/ MI s/p stent, arthritis, GERD, and a diagnosis of melanoma 1 month prior which was to be followed as an outpatient who was brought to [facility] via [facility]  for furtherworkup of suspected metastatic brain lesions.  The patient had an episode of acute confusion while visiting a relative in [place], and had been experiencing severe, constant headaches around the temporal and frontal area for about one week.   Upon admission to [facility], the patient was incoherent.  A CT of the head revealed multiple intracranial lesions. MRI brain confirmed these findings, including at least 2 separate lesions in the right cerebellum and bilateral temporal lobe lesions. CT of the chest abdomen and pelvis showed 2 low-density nodules in the liver, most likely cysts, and a CT of the chest showed a 2.4 x 1.8 cm left upper lobe nodule, with pathologic lymphadenopathy in the left hilum.  He received IV steroids with some improvement in his mental status. Because he lives in [place], he was transferred to [facility] for Neurosurgical evaluation in order to obtain the proper tissue diagnosis for treatment options.   Subjective: The patient is sedate at this time having just returned from radiology.  He does not appear uncomfortable.  His respirations are unlabored.  I spoke with multiple family members at the bedside who report that he was actually much more coherent today than he has been for the last few days.  I suspect this is due to the steroids reaching their maximum efficacy.  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  suspected metastatic melanoma - cont decadron and AED prophylaxis - CT scan chest abdomen and pelvis revealed only 1 modest sized  lesion in the left upper lobe and surprisingly no other systemic lesions - based upon my discussion with NS today if the family wishes to pursue aggressive care, which presently they do, the next step will be to proceed with resection of the right frontal and left temporal lobe large masses - Dr. Kathyrn Sheriff is discussing the case with Radiation Oncology to obtain their formal opinion as the patient will likely need whole brain radiation postoperatively - the tentative plan at this time is for probable neurosurgical OR on Friday  CAD - s/p stent Holding ASA for now - otherwise appears stable presently - no reported recent history of anginal symptoms or symptoms to suggest CHF  Hyperlipidemia Continue home statin when able  GERD Continue PPI  Goals of Care At present the family is in favor of aggressive treatment - given the fact that this was just recently a highly functional individual I do not feel this is unreasonable - family has asked me what the possibility of "success" is and I have made it clear to them that I am not in a position to provide any reliable information in regard to that - hopefully the radiation oncologist will be able to give them more specific figures - they have been informed of the grave nature of his condition and the high possibility of his death even with treatment - the daughter has contacted her father's attorney who has informed her that he does have an established healthcare power of attorney - this paperwork is to be faxed to the chart in the next  24 hours - at present I feel that he should be involved in the decision making process but I do not feel that he is competent to make those decisions on his own and with therefore wish to clear everything by his healthcare power of attorney as well  DVT prophylaxis: SCDs Code Status: FULL CODE Family Communication: Spoke with son, daughter, and brother at bedside Disposition Plan: SDU   Consultants:   NS  Procedures: none  Antimicrobials:  none   Objective: Blood pressure 123/90, pulse 69, temperature 97.4 F (36.3 C), temperature source Oral, resp. rate 17, height 6\' 1"  (1.854 m), weight 81.6 kg (180 lb), SpO2 92 %.  Intake/Output Summary (Last 24 hours) at 11/27/15 1856 Last data filed at 11/27/15 1600  Gross per 24 hour  Intake          1154.17 ml  Output             1000 ml  Net           154.17 ml   Filed Weights   11/24/15 1622 11/26/15 1153  Weight: 76 kg (167 lb 8.8 oz) 81.6 kg (180 lb)    Examination: General: No acute respiratory distress Lungs: Clear to auscultation bilaterally without wheezes  Cardiovascular: Regular rate and rhythm without murmur  Abdomen: Nondistended, soft, bowel sounds positive, no rebound, no ascites, no appreciable mass Extremities: No significant cyanosis, clubbing, or edema bilateral lower extremities  CBC:  Recent Labs Lab 11/24/15 1649 11/25/15 0450  WBC 23.9* 18.9*  NEUTROABS 21.9*  --   HGB 15.4 15.4  HCT 45.8 46.2  MCV 91.1 92.4  PLT 324 [id number]   Basic Metabolic Panel:  Recent Labs Lab 11/24/15 1649 11/25/15 0450  NA 136 138  K 3.7 3.6  CL 102 103  CO2 25 26  GLUCOSE 148* 150*  BUN 26* 26*  CREATININE 0.93 0.92  CALCIUM 9.4 9.3   GFR: Estimated Creatinine Clearance: 85.6 mL/min (by C-G formula based on SCr of 0.92 mg/dL).  Liver Function Tests:  Recent Labs Lab 11/24/15 1649  AST 32  ALT 28  ALKPHOS 81  BILITOT 1.3*  PROT 7.2  ALBUMIN 3.8    Coagulation Profile:  Recent Labs Lab 11/24/15 1649  INR 1.04   HbA1C: Hgb A1c MFr Bld  Date/Time Value Ref Range Status  11/24/2015 05:30 AM 5.4 4.8 - 5.6 % Final    Comment:    (NOTE)         Pre-diabetes: 5.7 - 6.4         Diabetes: >6.4         Glycemic control for adults with diabetes: <7.0     CBG:  Recent Labs Lab 11/25/15 0428 11/26/15 2354 11/27/15 0305 11/27/15 0749 11/27/15 1504  GLUCAP 159* 154* 144* 143* 168*    Recent  Results (from the past 240 hour(s))  MRSA PCR Screening     Status: None   Collection Time: 11/24/15  4:23 PM  Result Value Ref Range Status   MRSA by PCR NEGATIVE NEGATIVE Final    Comment:        The GeneXpert MRSA Assay (FDA approved for NASAL specimens only), is one component of a comprehensive MRSA colonization surveillance program. It is not intended to diagnose MRSA infection nor to guide or monitor treatment for MRSA infections.   Culture, blood (routine x 2)     Status: None (Preliminary result)   Collection Time: 11/26/15  5:18 PM  Result Value Ref Range  Status   Specimen Description BLOOD LEFT ANTECUBITAL  Final   Special Requests IN PEDIATRIC BOTTLE 2CC  Final   Culture NO GROWTH < 24 HOURS  Final   Report Status PENDING  Incomplete  Culture, blood (routine x 2)     Status: None (Preliminary result)   Collection Time: 11/26/15  5:20 PM  Result Value Ref Range Status   Specimen Description BLOOD RIGHT HAND  Final   Special Requests BOTTLES DRAWN AEROBIC AND ANAEROBIC 5CC  Final   Culture NO GROWTH < 24 HOURS  Final   Report Status PENDING  Incomplete     Scheduled Meds: . atorvastatin  80 mg Oral Daily  . carvedilol  3.125 mg Oral BID WC  . dexamethasone  4 mg Intravenous Q6H  . diatrizoate meglumine-sodium      . insulin aspart  0-9 Units Subcutaneous Q4H  . levETIRAcetam  750 mg Intravenous Q12H  . losartan  25 mg Oral Daily  . multivitamin with minerals  1 tablet Oral Daily  . pantoprazole  40 mg Oral Daily  . sodium chloride flush  3 mL Intravenous Q12H     LOS: 3 days   Cherene Altes, MD [facility] Office  [phone] Pager - Text Page per Amion as per below:  On-Call/Text Page:      [email or web address]      password [email or web address]  If 7PM-7AM, please contact night-coverage [email or web address] Password [facility] 11/27/2015, 6:56 PM

## 2015-11-27 NOTE — Progress Notes (Signed)
Rehab Admissions Coordinator Note:  Patient was screened by Cleatrice Burke for appropriateness for an Inpatient Acute Rehab Consult per PT recommendation.  At this time, we are recommending await further clarification of medical plan before determining rehab venue options.Cleatrice Burke 11/27/2015, 4:41 PM  I can be reached at [phone].

## 2015-11-27 NOTE — Evaluation (Signed)
Physical Therapy Evaluation Patient Details Name: Chad Ball TROWER MRN: [id number] DOB: [birth year] Today's Date: 11/27/2015   History of Present Illness  70 y.o. male with medical history significant for CAD s/p MI s/p stent , arthritis, GERD, recent diagnosis of melanoma 1 month ago ehich was to be followed as an outpatient, brought  via [facility]  for furtherworkup of metastatic brain lesions.CT of the head was performed, revealing multiple intracranial lesions, in the posterior foci. MRI brain confirmed these findings, including  at least 2 separate lesions in the right cerebellum and bilateral temporal lobe lesions. CT of the chest abdomen and pelvis showed 2 low-density nodules in the liver, most likely cysts, and a CT of the chest showed a 2.4 x 1.8 cm left upper lobe nodule, with pathologic lymphadenopathy in the left hilum  Clinical Impression  Pt with some improvement over initial OT session.  He is better able to communicate and follow commands.  He continues to have limited sitting and standing tolerance due to reports of lightheadedness/dizziness.  No nystagmus noted despite having reason to possibly have some central vestibular issues.  Pt would benefit from multidisciplinary therapy before d/c home if he is able to show continued improvement.   PT to follow acutely for deficits listed below.       Follow Up Recommendations CIR    Equipment Recommendations  Rolling walker with 5" wheels;Wheelchair (measurements PT);Wheelchair cushion (measurements PT);3in1 (PT)    Recommendations for Other Services Rehab consult     Precautions / Restrictions Precautions Precautions: Fall Precaution Comments: generally weak      Mobility  Bed Mobility Overal bed mobility: Needs Assistance;+ 2 for safety/equipment Bed Mobility: Supine to Sit;Sit to Supine     Supine to sit: Min guard Sit to supine: Min guard   General bed mobility comments: Min guard assist for safety during transitions,  once sitting, pt did quickly want to return to supine, but therapist encouraged him to stay longer, and try again after returnign to supine the first time.   Transfers Overall transfer level: Needs assistance Equipment used: 2 person hand held assist Transfers: Sit to/from Stand Sit to Stand: +2 physical assistance;Mod assist         General transfer comment: Two person mod assit to achieve almost full upright standing.  Pt standing over flexed knees, blocked for safety and using arms to stabilize for balance, pt unable to stand for even 30 seconds.    Ambulation/Gait             General Gait Details: unable at this time due to dizziness/lightheadedness in sitting or standing.          Balance Overall balance assessment: Needs assistance Sitting-balance support: Feet supported;Bilateral upper extremity supported Sitting balance-Leahy Scale: Fair     Standing balance support: Bilateral upper extremity supported Standing balance-Leahy Scale: Poor                               Pertinent Vitals/Pain Pain Assessment: No/denies pain    Home Living Family/patient expects to be discharged to:: Unsure Living Arrangements: Spouse/significant other;Parent                    Prior Function Level of Independence: Independent         Comments: Was independent until around 1 week ago. Was driving up to [place] to care for his Mom.  Wife reports he was here part time  and there at least 3 days per weekl.      Hand Dominance   Dominant Hand: Right    Extremity/Trunk Assessment   Upper Extremity Assessment: Defer to OT evaluation           Lower Extremity Assessment: Generalized weakness;RLE deficits/detail;LLE deficits/detail RLE Deficits / Details: Pt did not seem to have one side that was weaker than the other, just generally weak on both sides.  He was able to actively move both ankles, knees, lift legs against gravity to both get into and out of  bed, pt has flexed knees in standing and decreased sitting and standing tolerance.   LLE Deficits / Details: Pt did not seem to have one side that was weaker than the other, just generally weak on both sides.  He was able to actively move both ankles, knees, lift legs against gravity to both get into and out of bed, pt has flexed knees in standing and decreased sitting and standing tolerance.    Cervical / Trunk Assessment: Normal  Communication   Communication: Other (comment) (difficult to tell, waxes and wanes)  Cognition Arousal/Alertness: Lethargic Behavior During Therapy: Flat affect Overall Cognitive Status: Impaired/Different from baseline Area of Impairment: Orientation;Attention;Memory;Following commands;Safety/judgement;Awareness;Problem solving Orientation Level: Disoriented to;Place;Time;Situation Current Attention Level: Focused Memory: Decreased short-term memory Following Commands: Follows one step commands inconsistently;Follows multi-step commands inconsistently (at times it seems like when he wants to or feels like it) Safety/Judgement: Decreased awareness of deficits Awareness: Intellectual Problem Solving: Difficulty sequencing;Requires verbal cues;Requires tactile cues;Decreased initiation General Comments: Pt able to talk more today, identified his wife and her name, at times saying "I can't" when he actually can physically, closing his eyes almost as if to ignore therapist.  Cognition seems better today, but still not oriented to most basic questions.     General Comments General comments (skin integrity, edema, etc.): BPs taken and were soft, yet stable in both sitting and standing.  No significant drops noted.  Pt did not seem to have any nystagmus or double vision (glasses donned for session).            Assessment/Plan    PT Assessment Patient needs continued PT services  PT Diagnosis Difficulty walking;Abnormality of gait;Generalized weakness;Altered mental  status   PT Problem List Decreased strength;Decreased activity tolerance;Decreased balance;Decreased coordination;Decreased mobility;Decreased cognition;Decreased knowledge of use of DME;Decreased safety awareness;Decreased knowledge of precautions  PT Treatment Interventions DME instruction;Gait training;Stair training;Functional mobility training;Therapeutic exercise;Therapeutic activities;Balance training;Neuromuscular re-education;Cognitive remediation;Patient/family education   PT Goals (Current goals can be found in the Care Plan section) Acute Rehab PT Goals Patient Stated Goal: unable to state, wife wants him to get better PT Goal Formulation: With patient Time For Goal Achievement: 12/11/15 Potential to Achieve Goals: Fair    Frequency Min 3X/week           End of Session   Activity Tolerance: Patient limited by fatigue;Other (comment) (limited by dizziness in both sitting and standing. ) Patient left: in bed;with call bell/phone within reach;with bed alarm set;with family/visitor present Nurse Communication: Mobility status         Time: [id number] PT Time Calculation (min) (ACUTE ONLY): 27 min   Charges:   PT Evaluation $PT Eval High Complexity: 1 Procedure          Sarika Baldini B. [place], [place], DPT [phone]   11/27/2015, 4:23 PM

## 2015-11-27 NOTE — Progress Notes (Signed)
Occupational Therapy Treatment Patient Details Name: Chad Ball MRN: [id number] DOB: May 29, 1945 Today's Date: 11/27/2015    History of present illness 70 y.o. male with medical history significant for CAD s/p MI s/p stent , arthritis, GERD, recent diagnosis of melanoma 1 month ago ehich was to be followed as an outpatient, brought  via [facility]  for furtherworkup of metastatic brain lesions.CT of the head was performed, revealing multiple intracranial lesions, in the posterior foci. MRI brain confirmed these findings, including  at least 2 separate lesions in the right cerebellum and bilateral temporal lobe lesions. CT of the chest abdomen and pelvis showed 2 low-density nodules in the liver, most likely cysts, and a CT of the chest showed a 2.4 x 1.8 cm left upper lobe nodule, with pathologic lymphadenopathy in the left hilum   OT comments  Pt following commands better this session but continues to be unable to tolerate much OOB activity. Pt was able to sit EOB x 2 with min guard assist and was able to perform brief sit to stand from EOB x1 with mod assist +2. Pt c/o dizziness when upright. Updated d/c plan to CIR to maximize pts independence and safety with ADL and functional mobility prior to return home. Will continue to follow acutely.    Follow Up Recommendations  CIR;Supervision/Assistance - 24 hour    Equipment Recommendations  Other (comment) (TBD)    Recommendations for Other Services      Precautions / Restrictions Precautions Precautions: Fall Precaution Comments: generally weak Restrictions Weight Bearing Restrictions: No       Mobility Bed Mobility Overal bed mobility: Needs Assistance;+ 2 for safety/equipment Bed Mobility: Supine to Sit;Sit to Supine     Supine to sit: Min guard Sit to supine: Min guard   General bed mobility comments: Min guard assist for safety during transitions, once sitting, pt did quickly want to return to supine, but therapist  encouraged him to stay longer, and try again after returnign to supine the first time.   Transfers Overall transfer level: Needs assistance Equipment used: 2 person hand held assist Transfers: Sit to/from Stand Sit to Stand: +2 physical assistance;Mod assist         General transfer comment: Two person mod assit to achieve almost full upright standing.  Pt standing over flexed knees, blocked for safety and using arms to stabilize for balance, pt unable to stand for even 30 seconds.      Balance Overall balance assessment: Needs assistance Sitting-balance support: Feet supported;Bilateral upper extremity supported Sitting balance-Leahy Scale: Fair     Standing balance support: Bilateral upper extremity supported Standing balance-Leahy Scale: Poor                     ADL Overall ADL's : Needs assistance/impaired                                       General ADL Comments: Pt able to follow commands better today but did not perform ADL activity. Focus of session on getting pt up to sitting position. Pt not tolerating sitting or standing enough to perform ADL today. Pt c/o dizziness and requesting to lay back down when in sit/stand.       Vision                     Perception     Praxis  Cognition   Behavior During Therapy: Flat affect Overall Cognitive Status: Impaired/Different from baseline Area of Impairment: Orientation;Attention;Memory;Following commands;Safety/judgement;Awareness;Problem solving Orientation Level: Disoriented to;Place;Time;Situation Current Attention Level: Focused Memory: Decreased short-term memory  Following Commands: Follows one step commands inconsistently;Follows multi-step commands inconsistently (at times it seems like when he wants to or feels like it) Safety/Judgement: Decreased awareness of deficits Awareness: Intellectual Problem Solving: Difficulty sequencing;Requires verbal cues;Requires tactile  cues;Decreased initiation General Comments: Pt able to talk more today, identified his wife and her name, at times saying "I can't" when he actually can physically, closing his eyes almost as if to ignore therapist.  Cognition seems better today, but still not oriented to most basic questions.     Extremity/Trunk Assessment  Upper Extremity Assessment Upper Extremity Assessment: Defer to OT evaluation   Lower Extremity Assessment Lower Extremity Assessment: Generalized weakness;RLE deficits/detail;LLE deficits/detail RLE Deficits / Details: Pt did not seem to have one side that was weaker than the other, just generally weak on both sides.  He was able to actively move both ankles, knees, lift legs against gravity to both get into and out of bed, pt has flexed knees in standing and decreased sitting and standing tolerance.   LLE Deficits / Details: Pt did not seem to have one side that was weaker than the other, just generally weak on both sides.  He was able to actively move both ankles, knees, lift legs against gravity to both get into and out of bed, pt has flexed knees in standing and decreased sitting and standing tolerance.     Cervical / Trunk Assessment Cervical / Trunk Assessment: Normal    Exercises     Shoulder Instructions       General Comments      Pertinent Vitals/ Pain       Pain Assessment: No/denies pain  Home Living Family/patient expects to be discharged to:: Unsure Living Arrangements: Spouse/significant other;Parent                                      Prior Functioning/Environment Level of Independence: Independent        Comments: Was independent until around 1 week ago. Was driving up to [place] to care for his Mom.  Wife reports he was here part time and there at least 3 days per weekl.    Frequency Min 2X/week     Progress Toward Goals  OT Goals(current goals can now be found in the care plan section)  Progress towards OT goals:  Progressing toward goals  Acute Rehab OT Goals Patient Stated Goal: unable to state, wife wants him to get better OT Goal Formulation: With family  Plan Discharge plan needs to be updated    Co-evaluation    PT/OT/SLP Co-Evaluation/Treatment: Yes Reason for Co-Treatment: Complexity of the patient's impairments (multi-system involvement);For patient/therapist safety;Necessary to address cognition/behavior during functional activity   OT goals addressed during session: ADL's and self-care;Other (comment) (functional mobility)      End of Session     Activity Tolerance Patient limited by lethargy;Other (comment) (limited by dizziness)   Patient Left in bed;with call bell/phone within reach;with bed alarm set;with family/visitor present   Nurse Communication Mobility status        Time: [id number] OT Time Calculation (min): 24 min  Charges: OT General Charges $OT Visit: 1 Procedure OT Treatments $Therapeutic Activity: 8-22 mins   Binnie Kand M.S., OTR/L Pager:  [phone]  11/27/2015, 5:16 PM

## 2015-11-27 NOTE — Care Management Important Message (Signed)
Important Message  Patient Details  Name: Chad Ball MRN: [id number] Date of Birth: 13-Jan-1946   Medicare Important Message Given:  Yes    Nathen May 11/27/2015, 10:48 AM

## 2015-11-27 NOTE — Progress Notes (Signed)
SLP Cancellation Note  Patient Details Name: Chad Ball MRN: [id number] DOB: January 05, 1946   Cancelled treatment:       Reason Eval/Treat Not Completed: Medical issues which prohibited therapy. Note that pt remains NPO this morning. Per RN, pt was kept NPO due to possible need for further testing. Will hold PO trials at this time, following up as able.   Germain Osgood 11/27/2015, 10:12 AM  Germain Osgood, M.A. CCC-SLP [phone]

## 2015-11-28 LAB — GLUCOSE, CAPILLARY
GLUCOSE-CAPILLARY: 145 mg/dL — AB (ref 65–99)
GLUCOSE-CAPILLARY: 143 mg/dL — AB (ref 65–99)
GLUCOSE-CAPILLARY: 145 mg/dL — AB (ref 65–99)
Glucose-Capillary: 143 mg/dL — ABNORMAL HIGH (ref 65–99)
Glucose-Capillary: 136 mg/dL — ABNORMAL HIGH (ref 65–99)
Glucose-Capillary: 139 mg/dL — ABNORMAL HIGH (ref 65–99)

## 2015-11-28 NOTE — Progress Notes (Signed)
PROGRESS NOTE    Chad Ball  [id number] DOB: 03/28/1946 DOA: 11/24/2015 PCP: No primary care provider on file.   Brief Narrative:  70 y.o. WM PMHx CAD, S/P MI s/p stent , Arthritis, GERD, Dx Melanoma in June 2017 with removal by dermatologist in [place]. was to be followed as an outpatient.  Brought  via [facility]  for furtherworkup of metastatic brain lesions. In review, the patient had an episode of acute confusion was visiting his relative in [place]. In addition, he was experiencing severe, constant headaches around the temporal and frontal area, for about one week prior to presentation. Upon admission to [facility], the patient was incoherent, unable to articulate words.this weeks, he had no other or abuse symptoms such as dizziness, nausea, vomiting or vision changes. At the hospital, his CT of the head was performed, revealing multiple intracranial lesions, in the posterior foci. MRI brain confirmed these findings, including  at least 2 separate lesions in the right cerebellum and bilateral temporal lobe lesions. CT of the chest abdomen and pelvis showed 2 low-density nodules in the liver, most likely cysts, and a CT of the chest showed a 2.4 x 1.8 cm left upper lobe nodule, with pathologic lymphadenopathy in the left hilum.  He received IV steroids with some improvement of his mental status. Because he lives in [place], he was transferred to [facility] for Neurosurgical evaluation in order to obtain the proper tissue diagnosis for treatment options. Patient is currently alert, but unable to engage in conversation, he follows very simple, and. According to his wife, he is more interactive since the initiation of steroids.    Subjective: 8/3  A/O 1 (does not know where, when, why), NAD.    Assessment & Plan:   Active Problems:   Essential hypertension   Hyperlipidemia   CAD in native artery   Brain mass   Melanoma of skin (HCC)   Brain  metastasis ([place])   Brain tumor ([place])   Metastatic melanoma ([place])   Metastatic Brain lesions,  -suspected metastatic melanoma, in view of recent diagnosis per dermatologist.  -S/P MRI 8/1 findings characteristic of metastatic melanoma, spoke with Dr. Alen Blew who agreed no role for medical oncology. Patient would require radiation oncology -CT chest/abdomen and pelvis; metastases to lung see results below  -Neurosurgery; planned surgery 8/4  -Radiation Oncology consult pending. -Decadron 4 mg QID -Keppra 750 mg BID (started?) -NPO: D5- 0.9% saline 53ml/hr -Sensitive SSI  CAD,s/p stent   - patient appears  cardiac pain free at this time. -Patient not on Plavix by admission MAR - Hold Plavix and  ASA as some of his brain lesions are bleeding.   HTN -Coreg 3.125 mg BID  Hyperlipidemia -Lipitor 80 mg daily    Leukocytosis,  -likely reactive, related to steroids WBC 13 Afebrile   IVF  -Will hold antibiotics for now   GERD,  -no acute symptoms: Continue PPI   DVT prophylaxis: SCD Code Status: Full Family Communication: Whole family present, reviewed MRI with family in detail Disposition Plan: Await neurosurgery input most likely palliative care/hospice. Awaiting callback from radiation oncology   Consultants:  Neurosurgery pending Radiation oncology pending    Procedures/Significant Events:  MRA head W/WO contrast:-Numerous intracranial masses within the supratentorial and infratentorial region numbering approximately 25 containing blood breakdown products or melanin suggestive of metastatic melanoma -Right anterior frontal 4.8 x 4.9 x 5 cm mass with marked surrounding vasogenic edema.  -Left temporal lobe 4.8 x 3.2 x 3.1 cm mass  with marked surrounding vasogenic edema and flattening of the left temporal horn. -Superior right cerebellar 1.8 cm and 1.4 cm mass with surrounding vasogenic edema and mild compression the right lateral aspect of the fourth  ventricle. -Blood or proteinaceous material layering within the deep dependent aspect of the lateral ventricles suggesting breakthrough of hemorrhagic metastatic lesion into the ventricular system.  8/2 CT chest abdomen and pelvis:-Pulmonary nodule LUL suspicious for metastases. -No specific findings to suggest metastatic disease to abdomen or pelvis.      Cultures None  Antimicrobials: None   Devices None   LINES / TUBES:  None    Continuous Infusions: . dextrose 5 % and 0.9% NaCl 50 mL/hr at 11/28/15 1851     Objective: Vitals:   11/28/15 0715 11/28/15 1100 11/28/15 1130 11/28/15 1519  BP: 132/87  133/88 (!) 141/88  Pulse: 64  60 63  Resp: 10  10 12   Temp: (!) 96.6 F (35.9 C) 97 F (36.1 C)  98.3 F (36.8 C)  TempSrc: Axillary Axillary  Oral  SpO2: 95%  93% 93%  Weight:      Height:        Intake/Output Summary (Last 24 hours) at 11/28/15 1917 Last data filed at 11/28/15 1851  Gross per 24 hour  Intake             2185 ml  Output             2165 ml  Net               20 ml   Filed Weights   11/24/15 1622 11/26/15 1153  Weight: 76 kg (167 lb 8.8 oz) 81.6 kg (180 lb)    Examination:  General:A/O 1 (does not know where, when, why), No acute respiratory distress Eyes: negative scleral hemorrhage, negative anisocoria, negative icterus ENT: Negative Runny nose, negative gingival bleeding, Neck:  Negative scars, masses, torticollis, lymphadenopathy, JVD Lungs: Clear to auscultation bilaterally without wheezes or crackles Cardiovascular: Regular rate and rhythm without murmur gallop or rub normal S1 and S2 Abdomen: negative abdominal pain, nondistended, positive soft, bowel sounds, no rebound, no ascites, no appreciable mass Extremities: No significant cyanosis, clubbing, or edema bilateral lower extremities Skin: Negative rashes, lesions, ulcers Psychiatric:  Unable to assess  Central nervous system:  Cranial nerves II through XII intact,  tongue/uvula midline, patient follow commands negative dysarthria, negative expressive aphasia, negative receptive aphasia. Unable to assess the remainder of exam  .     Data Reviewed: Care during the described time interval was provided by me .  I have reviewed this patient's available data, including medical history, events of note, physical examination, and all test results as part of my evaluation. I have personally reviewed and interpreted all radiology studies.  CBC:  Recent Labs Lab 11/24/15 1649 11/25/15 0450  WBC 23.9* 18.9*  NEUTROABS 21.9*  --   HGB 15.4 15.4  HCT 45.8 46.2  MCV 91.1 92.4  PLT 324 [id number]   Basic Metabolic Panel:  Recent Labs Lab 11/24/15 1649 11/25/15 0450  NA 136 138  K 3.7 3.6  CL 102 103  CO2 25 26  GLUCOSE 148* 150*  BUN 26* 26*  CREATININE 0.93 0.92  CALCIUM 9.4 9.3   GFR: Estimated Creatinine Clearance: 85.6 mL/min (by C-G formula based on SCr of 0.92 mg/dL). Liver Function Tests:  Recent Labs Lab 11/24/15 1649  AST 32  ALT 28  ALKPHOS 81  BILITOT 1.3*  PROT 7.2  ALBUMIN 3.8  No results for input(s): LIPASE, AMYLASE in the last 168 hours. No results for input(s): AMMONIA in the last 168 hours. Coagulation Profile:  Recent Labs Lab 11/24/15 1649  INR 1.04   Cardiac Enzymes: No results for input(s): CKTOTAL, CKMB, CKMBINDEX, TROPONINI in the last 168 hours. BNP (last 3 results) No results for input(s): PROBNP in the last 8760 hours. HbA1C: No results for input(s): HGBA1C in the last 72 hours. CBG:  Recent Labs Lab 11/27/15 2318 11/28/15 0401 11/28/15 0815 11/28/15 1210 11/28/15 1614  GLUCAP 134* 145* 136* 143* 145*   Lipid Profile: No results for input(s): CHOL, HDL, LDLCALC, TRIG, CHOLHDL, LDLDIRECT in the last 72 hours. Thyroid Function Tests: No results for input(s): TSH, T4TOTAL, FREET4, T3FREE, THYROIDAB in the last 72 hours. Anemia Panel: No results for input(s): VITAMINB12, FOLATE, FERRITIN, TIBC,  IRON, RETICCTPCT in the last 72 hours. Urine analysis:    Component Value Date/Time   BILIRUBINUR large (A) 09/02/2015 1054   KETONESUR small (15) (A) 09/02/2015 1054   PROTEINUR =100 (A) 09/02/2015 1054   UROBILINOGEN 1.0 09/02/2015 1054   NITRITE Positive (A) 09/02/2015 1054   LEUKOCYTESUR Trace (A) 09/02/2015 1054   Sepsis Labs: @LABRCNTIP (procalcitonin:4,lacticidven:4)  ) Recent Results (from the past 240 hour(s))  MRSA PCR Screening     Status: None   Collection Time: 11/24/15  4:23 PM  Result Value Ref Range Status   MRSA by PCR NEGATIVE NEGATIVE Final    Comment:        The GeneXpert MRSA Assay (FDA approved for NASAL specimens only), is one component of a comprehensive MRSA colonization surveillance program. It is not intended to diagnose MRSA infection nor to guide or monitor treatment for MRSA infections.   Culture, blood (routine x 2)     Status: None (Preliminary result)   Collection Time: 11/26/15  5:18 PM  Result Value Ref Range Status   Specimen Description BLOOD LEFT ANTECUBITAL  Final   Special Requests IN PEDIATRIC BOTTLE 2CC  Final   Culture NO GROWTH 2 DAYS  Final   Report Status PENDING  Incomplete  Culture, blood (routine x 2)     Status: None (Preliminary result)   Collection Time: 11/26/15  5:20 PM  Result Value Ref Range Status   Specimen Description BLOOD RIGHT HAND  Final   Special Requests BOTTLES DRAWN AEROBIC AND ANAEROBIC 5CC  Final   Culture NO GROWTH 2 DAYS  Final   Report Status PENDING  Incomplete         Radiology Studies: Ct Chest W Contrast  Result Date: 11/27/2015 CLINICAL DATA:  Metastatic melanoma. EXAM: CT CHEST, ABDOMEN, AND PELVIS WITH CONTRAST TECHNIQUE: Multidetector CT imaging of the chest, abdomen and pelvis was performed following the standard protocol during bolus administration of intravenous contrast. CONTRAST:  17mL ISOVUE-300 IOPAMIDOL (ISOVUE-300) INJECTION 61% COMPARISON:  None. FINDINGS: CT CHEST FINDINGS  Mediastinum/Lymph Nodes: The heart size appears normal. No pericardial effusion. Aortic atherosclerosis noted. LAD coronary artery calcification is identified. The trachea appears patent and is midline. Unremarkable appearance of the esophagus. No enlarged mediastinal, axillary or supraclavicular lymph nodes. Lungs/Pleura: No pleural effusion identified. Pulmonary nodule in the left upper lobe measures 2.4 cm, image 51 of series 4. Scarring noted within both lower lobes. Musculoskeletal: No chest wall mass or suspicious bone lesions identified. CT ABDOMEN PELVIS FINDINGS Hepatobiliary: There are 2 low-attenuation foci within the liver. The largest measure 9 mm. These are too small to reliably characterize. The gallbladder appears normal. No biliary dilatation. Pancreas:  No mass, inflammatory changes, or other significant abnormality. Spleen: Within normal limits in size and appearance. Adrenals/Urinary Tract: Normal appearance of the adrenal glands. Striated nephrographic appearance of the right kidney is identified, image number 18 of series 8. The urinary bladder appears normal. Stomach/Bowel: Small hiatal hernia. There is no pathologic dilatation of the small bowel loops. Numerous colonic diverticula are identified. No acute inflammation. Vascular/Lymphatic: Calcified atherosclerotic disease involves the abdominal aorta. No aneurysm. No enlarged upper abdominal lymph nodes identified. No pelvic or inguinal adenopathy. Reproductive: Prostate gland enlargement noted. Other: There is no ascites or focal fluid collections within the abdomen or pelvis. Musculoskeletal:  No aggressive lytic or sclerotic bone lesions. IMPRESSION: 1. Pulmonary nodule within the left upper lobe is identified and suspicious for metastases. 2. No specific findings identified to suggest metastatic disease to the abdomen or pelvis. 3. Striated nephrographic appearance of the right kidney. This is a nonspecific finding and may be seen with  pyelonephritis as well as vascular abnormality such as embolic phenomenon. Clinical correlation suggested. Electronically Signed   By: Kerby Moors M.D.   On: 11/27/2015 17:46   Ct Abdomen Pelvis W Contrast  Result Date: 11/27/2015 CLINICAL DATA:  Metastatic melanoma. EXAM: CT CHEST, ABDOMEN, AND PELVIS WITH CONTRAST TECHNIQUE: Multidetector CT imaging of the chest, abdomen and pelvis was performed following the standard protocol during bolus administration of intravenous contrast. CONTRAST:  181mL ISOVUE-300 IOPAMIDOL (ISOVUE-300) INJECTION 61% COMPARISON:  None. FINDINGS: CT CHEST FINDINGS Mediastinum/Lymph Nodes: The heart size appears normal. No pericardial effusion. Aortic atherosclerosis noted. LAD coronary artery calcification is identified. The trachea appears patent and is midline. Unremarkable appearance of the esophagus. No enlarged mediastinal, axillary or supraclavicular lymph nodes. Lungs/Pleura: No pleural effusion identified. Pulmonary nodule in the left upper lobe measures 2.4 cm, image 51 of series 4. Scarring noted within both lower lobes. Musculoskeletal: No chest wall mass or suspicious bone lesions identified. CT ABDOMEN PELVIS FINDINGS Hepatobiliary: There are 2 low-attenuation foci within the liver. The largest measure 9 mm. These are too small to reliably characterize. The gallbladder appears normal. No biliary dilatation. Pancreas: No mass, inflammatory changes, or other significant abnormality. Spleen: Within normal limits in size and appearance. Adrenals/Urinary Tract: Normal appearance of the adrenal glands. Striated nephrographic appearance of the right kidney is identified, image number 18 of series 8. The urinary bladder appears normal. Stomach/Bowel: Small hiatal hernia. There is no pathologic dilatation of the small bowel loops. Numerous colonic diverticula are identified. No acute inflammation. Vascular/Lymphatic: Calcified atherosclerotic disease involves the abdominal aorta.  No aneurysm. No enlarged upper abdominal lymph nodes identified. No pelvic or inguinal adenopathy. Reproductive: Prostate gland enlargement noted. Other: There is no ascites or focal fluid collections within the abdomen or pelvis. Musculoskeletal:  No aggressive lytic or sclerotic bone lesions. IMPRESSION: 1. Pulmonary nodule within the left upper lobe is identified and suspicious for metastases. 2. No specific findings identified to suggest metastatic disease to the abdomen or pelvis. 3. Striated nephrographic appearance of the right kidney. This is a nonspecific finding and may be seen with pyelonephritis as well as vascular abnormality such as embolic phenomenon. Clinical correlation suggested. Electronically Signed   By: Kerby Moors M.D.   On: 11/27/2015 17:46        Scheduled Meds: . atorvastatin  80 mg Oral Daily  . carvedilol  3.125 mg Oral BID WC  . dexamethasone  4 mg Intravenous Q6H  . famotidine (PEPCID) IV  20 mg Intravenous Q12H  . insulin aspart  0-9  Units Subcutaneous Q4H  . levETIRAcetam  750 mg Intravenous Q12H  . multivitamin with minerals  1 tablet Oral Daily  . sodium chloride flush  3 mL Intravenous Q12H   Continuous Infusions: . dextrose 5 % and 0.9% NaCl 50 mL/hr at 11/28/15 1851     LOS: 4 days    Time spent: 40 minutes    Sritha Chauncey, Geraldo Docker, MD [facility] Pager [phone]   If 7PM-7AM, please contact night-coverage [email or web address] Password [email or web address] 11/28/2015, 7:17 PM

## 2015-11-28 NOTE — Progress Notes (Signed)
   11/28/15 1100  Clinical Encounter Type  Visited With Patient and family together  Visit Type Initial  Referral From Nurse  Consult/Referral To Chaplain  Spiritual Encounters  Spiritual Needs Prayer  Stress Factors  Patient Stress Factors Loss of control  Family Stress Factors Loss of control  CHP visited patient and daughter. Provided prayer and presence. Roe Coombs 11/28/15

## 2015-11-28 NOTE — Progress Notes (Signed)
No issues overnight. Pt is conversant, has no significant complaints  EXAM:  BP (!) 141/88 (BP Location: Left Arm)   Pulse 63   Temp 98.3 F (36.8 C) (Oral)   Resp 12   Ht 6\' 1"  (1.854 m)   Wt 81.6 kg (180 lb)   SpO2 93%   BMI 23.75 kg/m   Awake, alert, oriented  Speech fluent CN grossly intact  5/5 BUE/BLE   IMPRESSION:  70 y.o. male with multiple brain mets, almost certainly melanoma given his history. I have reviewed the case with my colleagues as well as the radiation oncologists. The majority opinion has been to forgo any type of surgical treatment given the risks and expected morbidity/recovery time and instead pursue whole brain radiation and possibly medical treatment (immunotherapy). If tissue is absolutely needed, could get IR guided biopsy of left lung nodule.  PLAN: - I have spoken with the radiation oncology coordinator to start working on setting up WBRT, possibly to begin as an inpatient. - He will need medical oncology consult - Please call with questions.  I have reviewed the plan above with the patient and his family. They are eager to proceed with treatment rather than taking a palliative approach, which was discussed and is certainly also an option. I told them that after lengthy discussion with my partners and rad onc, surgery seems less reasonable. All their questions were answered.

## 2015-11-28 NOTE — Progress Notes (Signed)
Speech Language Pathology Treatment: Dysphagia  Patient Details Name: Chad Ball MRN: [id number] DOB: 26-Aug-1945 Today's Date: 11/28/2015 Time: 1208-1216 SLP Time Calculation (min) (ACUTE ONLY): 8 min  Assessment / Plan / Recommendation Clinical Impression  Pt currently on clear liquid diet. Pt consumed thin liquids with immediate cough x1 following initial large sip, with no other overt s/s of aspiration observed. RN and family report good tolerance of POs thus far, and he remains afebrile. Reiterated to family the fluctuating impact that mentation can have on swallowing safety. Recommend to continue current diet with advancement to Dys 3 textures as deemed appropriate by medical team. SLP to sign off for swallow. Please consider SLP cognitive-linguistic evaluation if family is pursuing aggressive treatment.   HPI HPI: 70 y.o.malewith a Past Medical History of CAD with remote MI, sp stent, gerd, recent excision of suspected melanoma on his anterior chest about 1 month ago outpt, presented to outside hospital for AMS, found to have multiple intracranial mets and small lung nodule. Pt transferred to [facility] for neurosurgery eval.       SLP Plan  All goals met     Recommendations  Diet recommendations: Dysphagia 3 (mechanical soft);Thin liquid Liquids provided via: Cup;Straw Medication Administration: Whole meds with liquid Supervision: Patient able to self feed;Full supervision/cueing for compensatory strategies Compensations: Minimize environmental distractions;Slow rate;Small sips/bites;Follow solids with liquid Postural Changes and/or Swallow Maneuvers: Seated upright 90 degrees;Upright 30-60 min after meal             Oral Care Recommendations: Oral care BID Follow up Recommendations: 24 hour supervision/assistance Plan: All goals met     GO                Germain Osgood 11/28/2015, 1:43 PM  Germain Osgood, M.A. CCC-SLP [phone]

## 2015-11-28 NOTE — Progress Notes (Signed)
Pt woke from sleep very confused and tried to get of bed at the same time pulling out his IV accesses.

## 2015-11-29 ENCOUNTER — Telehealth: Payer: Self-pay | Admitting: *Deleted

## 2015-11-29 ENCOUNTER — Ambulatory Visit: Payer: Medicare Other | Admitting: Radiation Oncology

## 2015-11-29 ENCOUNTER — Encounter (HOSPITAL_COMMUNITY): Payer: Self-pay | Admitting: Orthopedic Surgery

## 2015-11-29 ENCOUNTER — Ambulatory Visit
Admit: 2015-11-29 | Discharge: 2015-11-29 | Disposition: A | Discharge status: Home / Self Care | Payer: Medicare Other | Attending: Radiation Oncology | Admitting: Radiation Oncology

## 2015-11-29 DIAGNOSIS — C801 Malignant (primary) neoplasm, unspecified: Secondary | ICD-10-CM | POA: Insufficient documentation

## 2015-11-29 DIAGNOSIS — Z510 Encounter for antineoplastic radiation therapy: Secondary | ICD-10-CM | POA: Insufficient documentation

## 2015-11-29 DIAGNOSIS — C7931 Secondary malignant neoplasm of brain: Secondary | ICD-10-CM | POA: Insufficient documentation

## 2015-11-29 LAB — COMPREHENSIVE METABOLIC PANEL
ALT: 25 U/L (ref 17–63)
AST: 28 U/L (ref 15–41)
Albumin: 3 g/dL — ABNORMAL LOW (ref 3.5–5.0)
Alkaline Phosphatase: 73 U/L (ref 38–126)
Anion gap: 9 (ref 5–15)
BILIRUBIN TOTAL: 1.7 mg/dL — AB (ref 0.3–1.2)
BUN: 26 mg/dL — AB (ref 6–20)
CHLORIDE: 102 mmol/L (ref 101–111)
CO2: 24 mmol/L (ref 22–32)
Calcium: 8.6 mg/dL — ABNORMAL LOW (ref 8.9–10.3)
Creatinine, Ser: 0.83 mg/dL (ref 0.61–1.24)
Glucose, Bld: 133 mg/dL — ABNORMAL HIGH (ref 65–99)
POTASSIUM: 4.1 mmol/L (ref 3.5–5.1)
Sodium: 135 mmol/L (ref 135–145)
TOTAL PROTEIN: 5.8 g/dL — AB (ref 6.5–8.1)

## 2015-11-29 LAB — GLUCOSE, CAPILLARY
GLUCOSE-CAPILLARY: 131 mg/dL — AB (ref 65–99)
GLUCOSE-CAPILLARY: 133 mg/dL — AB (ref 65–99)
GLUCOSE-CAPILLARY: 140 mg/dL — AB (ref 65–99)
GLUCOSE-CAPILLARY: 142 mg/dL — AB (ref 65–99)
Glucose-Capillary: 130 mg/dL — ABNORMAL HIGH (ref 65–99)
Glucose-Capillary: 141 mg/dL — ABNORMAL HIGH (ref 65–99)

## 2015-11-29 LAB — MAGNESIUM: MAGNESIUM: 2.2 mg/dL (ref 1.7–2.4)

## 2015-11-29 LAB — CBC WITH DIFFERENTIAL/PLATELET
BASOS ABS: 0 10*3/uL (ref 0.0–0.1)
Basophils Relative: 0 %
EOS ABS: 0 10*3/uL (ref 0.0–0.7)
EOS PCT: 0 %
HCT: 45.6 % (ref 39.0–52.0)
HEMOGLOBIN: 15.1 g/dL (ref 13.0–17.0)
LYMPHS ABS: 0.6 10*3/uL — AB (ref 0.7–4.0)
LYMPHS PCT: 3 %
MCH: 30.3 pg (ref 26.0–34.0)
MCHC: 33.1 g/dL (ref 30.0–36.0)
MCV: 91.4 fL (ref 78.0–100.0)
Monocytes Absolute: 1.1 10*3/uL — ABNORMAL HIGH (ref 0.1–1.0)
Monocytes Relative: 6 %
NEUTROS PCT: 91 %
Neutro Abs: 18.2 10*3/uL — ABNORMAL HIGH (ref 1.7–7.7)
PLATELETS: 201 10*3/uL (ref 150–400)
RBC: 4.99 MIL/uL (ref 4.22–5.81)
RDW: 13.7 % (ref 11.5–15.5)
WBC: 19.9 10*3/uL — AB (ref 4.0–10.5)

## 2015-11-29 LAB — PROTIME-INR
INR: 0.99
Prothrombin Time: 13.1 seconds (ref 11.4–15.2)

## 2015-11-29 MED ORDER — PANTOPRAZOLE SODIUM 40 MG PO TBEC
40.0000 mg | DELAYED_RELEASE_TABLET | Freq: Every day | ORAL | Status: DC
  Administered 2015-11-30 – 2015-12-05 (×6): 40 mg via ORAL
  Filled 2015-11-29 (×6): qty 1

## 2015-11-29 NOTE — Telephone Encounter (Signed)
Called [facility] S [id number] spoke with RN Loree Fee, to get report on Patient, he needs to be at our facility [facility] de[facility] via [facility] on Monday 12/02/15 at 10:00am, please call and coordinate transportation for the patient, wife  Will need to come or family member to sign consent for Ct simulation for whole brain, which will take about an hour at our dept.,  please medicate patient for pain  If necessary by 9am, [facility] should be at [facility] around 930am to pick up the patient,  patien still confused and weak, Whitney will try and get this cordinated, if not, she will let charge nurse know 3:43 PM

## 2015-11-29 NOTE — Consult Note (Signed)
Radiation Oncology         [phone] ________________________________   INPATIENT  Name: Chad Ball MRN: [id number]  Date: 11/24/2015  DOB: [birth year]   REFERRING PHYSICIAN: Dr. Reina Fuse   DIAGNOSIS: Diagnoses of Brain tumor [facility]) and Metastatic melanoma ([place]) were pertinent to this visit.   HISTORY OF PRESENT ILLNESS::Chad Ball is a 70 y.o. male who is seen for an initial consultation visit regarding the patient's diagnosis of metastatic cancer with brain metastasis.  The patient presented with some altered mental status. The patient according to family became on intelligible. This followed a week where the patient did have a headache as well which was persistent. The patient was in [place] at that time. The patient's wife visited him and workup there revealed the likelihood of brain metastases. A subsequent brain MRI scan showed numerous brain metastases consistent with his history of melanoma which was removed within the last couple of months. CT imaging systemically also revealed a long tumor consistent with pulmonary metastasis.  The patient has been seen by neurosurgery and surgery was considered. However, given his overall presentation, it has been decided to forego this at this time. I have therefore been asked to see the patient today consideration of whole brain radiation treatment.    PREVIOUS RADIATION THERAPY: No   PAST MEDICAL HISTORY:  has a past medical history of Arthritis; GERD (gastroesophageal reflux disease); and Myocardial infarction ([place]).     PAST SURGICAL HISTORY: Past Surgical History:  Procedure Laterality Date  . APPENDECTOMY    . HERNIA REPAIR    . RADIOLOGY WITH ANESTHESIA N/A 11/26/2015   Procedure: MRI BRAIN WITH WITH AND WITHOUT CONTRAST;  Surgeon: Medication Radiologist, MD;  Location: [place];  Service: Radiology;  Laterality: N/A;     FAMILY HISTORY: family history includes Heart disease in his mother; Hyperlipidemia in his  mother.   SOCIAL HISTORY:  reports that he has never smoked. He does not have any smokeless tobacco history on file.   ALLERGIES: Other   MEDICATIONS:  Current Facility-Administered Medications  Medication Dose Route Frequency Provider Last Rate Last Dose  . acetaminophen (TYLENOL) tablet 650 mg  650 mg Oral Q6H PRN Cherene Altes, MD      . atorvastatin (LIPITOR) tablet 80 mg  80 mg Oral Daily Maren Reamer, MD   80 mg at 11/29/15 1819  . carvedilol (COREG) tablet 3.125 mg  3.125 mg Oral BID WC Cherene Altes, MD   3.125 mg at 11/29/15 1819  . dexamethasone (DECADRON) injection 4 mg  4 mg Intravenous Q6H Maren Reamer, MD   4 mg at 11/29/15 1359  . dextrose 5 %-0.9 % sodium chloride infusion   Intravenous Continuous Cherene Altes, MD 30 mL/hr at 11/29/15 1617    . insulin aspart (novoLOG) injection 0-9 Units  0-9 Units Subcutaneous Q4H Cherene Altes, MD   1 Units at 11/29/15 1819  . levETIRAcetam (KEPPRA) 750 mg in sodium chloride 0.9 % 100 mL IVPB  750 mg Intravenous Q12H Kevan Ny Ditty, MD   750 mg at 11/29/15 0848  . morphine 2 MG/ML injection 1-2 mg  1-2 mg Intravenous Q3H PRN Cherene Altes, MD   2 mg at 11/26/15 0352  . multivitamin with minerals tablet 1 tablet  1 tablet Oral Daily Maren Reamer, MD   1 tablet at 11/29/15 1024  . oxyCODONE (Oxy IR/ROXICODONE) immediate release tablet 5-10 mg  5-10 mg Oral Q4H PRN Cherene Altes,  MD   10 mg at 11/29/15 1359  . [START ON 11/30/2015] pantoprazole (PROTONIX) EC tablet 40 mg  40 mg Oral Q1200 Cherene Altes, MD      . sodium chloride flush (NS) 0.9 % injection 3 mL  3 mL Intravenous Q12H Maren Reamer, MD   3 mL at 11/29/15 1000     REVIEW OF SYSTEMS:  A 15 point review of systems is documented in the electronic medical record. This was obtained by the nursing staff. However, I reviewed this with the patient to discuss relevant findings and make appropriate changes.  Pertinent items are noted in  HPI.    PHYSICAL EXAM:  height is 6\' 1"  (1.854 m) and weight is 180 lb (81.6 kg). His oral temperature is 98 F (36.7 C). His blood pressure is 127/92 (abnormal) and his pulse is 54 (abnormal). His respiration is 13 and oxygen saturation is 95%.   ECOG = 2-3  0 - Asymptomatic (Fully active, able to carry on all predisease activities without restriction)  1 - Symptomatic but completely ambulatory (Restricted in physically strenuous activity but ambulatory and able to carry out work of a light or sedentary nature. For example, light housework, office work)  2 - Symptomatic, <50% in bed during the day (Ambulatory and capable of all self care but unable to carry out any work activities. Up and about more than 50% of waking hours)  3 - Symptomatic, >50% in bed, but not bedbound (Capable of only limited self-care, confined to bed or chair 50% or more of waking hours)  4 - Bedbound (Completely disabled. Cannot carry on any self-care. Totally confined to bed or chair)  5 - Death   Chad Ball MM, Creech RH, Tormey DC, et al. [phone]). "Toxicity and response criteria of the [facility]". [place] Oncol. 5 (6): 649-55  Alert, no acute distress, vital signs stable   LABORATORY DATA:  Lab Results  Component Value Date   WBC 19.9 (H) 11/29/2015   HGB 15.1 11/29/2015   HCT 45.6 11/29/2015   MCV 91.4 11/29/2015   PLT 201 11/29/2015   Lab Results  Component Value Date   NA 135 11/29/2015   K 4.1 11/29/2015   CL 102 11/29/2015   CO2 24 11/29/2015   Lab Results  Component Value Date   ALT 25 11/29/2015   AST 28 11/29/2015   ALKPHOS 73 11/29/2015   BILITOT 1.7 (H) 11/29/2015      RADIOGRAPHY: Ct Chest W Contrast  Result Date: 11/27/2015 CLINICAL DATA:  Metastatic melanoma. EXAM: CT CHEST, ABDOMEN, AND PELVIS WITH CONTRAST TECHNIQUE: Multidetector CT imaging of the chest, abdomen and pelvis was performed following the standard protocol during bolus administration of  intravenous contrast. CONTRAST:  158mL ISOVUE-300 IOPAMIDOL (ISOVUE-300) INJECTION 61% COMPARISON:  None. FINDINGS: CT CHEST FINDINGS Mediastinum/Lymph Nodes: The heart size appears normal. No pericardial effusion. Aortic atherosclerosis noted. LAD coronary artery calcification is identified. The trachea appears patent and is midline. Unremarkable appearance of the esophagus. No enlarged mediastinal, axillary or supraclavicular lymph nodes. Lungs/Pleura: No pleural effusion identified. Pulmonary nodule in the left upper lobe measures 2.4 cm, image 51 of series 4. Scarring noted within both lower lobes. Musculoskeletal: No chest wall mass or suspicious bone lesions identified. CT ABDOMEN PELVIS FINDINGS Hepatobiliary: There are 2 low-attenuation foci within the liver. The largest measure 9 mm. These are too small to reliably characterize. The gallbladder appears normal. No biliary dilatation. Pancreas: No mass, inflammatory changes, or other  significant abnormality. Spleen: Within normal limits in size and appearance. Adrenals/Urinary Tract: Normal appearance of the adrenal glands. Striated nephrographic appearance of the right kidney is identified, image number 18 of series 8. The urinary bladder appears normal. Stomach/Bowel: Small hiatal hernia. There is no pathologic dilatation of the small bowel loops. Numerous colonic diverticula are identified. No acute inflammation. Vascular/Lymphatic: Calcified atherosclerotic disease involves the abdominal aorta. No aneurysm. No enlarged upper abdominal lymph nodes identified. No pelvic or inguinal adenopathy. Reproductive: Prostate gland enlargement noted. Other: There is no ascites or focal fluid collections within the abdomen or pelvis. Musculoskeletal:  No aggressive lytic or sclerotic bone lesions. IMPRESSION: 1. Pulmonary nodule within the left upper lobe is identified and suspicious for metastases. 2. No specific findings identified to suggest metastatic disease to  the abdomen or pelvis. 3. Striated nephrographic appearance of the right kidney. This is a nonspecific finding and may be seen with pyelonephritis as well as vascular abnormality such as embolic phenomenon. Clinical correlation suggested. Electronically Signed   By: Kerby Moors M.D.   On: 11/27/2015 17:46   Mr [name] [id number] Contrast  Result Date: 11/26/2015 CLINICAL DATA:  70 year old male with speech difficulty and imbalance. Symptoms started 1 week ago complaining of headache. History melanoma removed 1 month ago. Abnormal imaging at [facility] in [place]. Initial encounter. EXAM: MRI HEAD WITHOUT AND WITH CONTRAST TECHNIQUE: Multiplanar, multiecho pulse sequences of the brain and surrounding structures were obtained without and with intravenous contrast. CONTRAST:  31mL MULTIHANCE GADOBENATE DIMEGLUMINE 529 MG/ML IV SOLN COMPARISON:  No comparison exams available. FINDINGS: Exam was performed under general anesthesia. Numerous intracranial masses within the supratentorial and infratentorial region numbering approximately 25 containing blood breakdown products or melanin suggestive of metastatic melanoma given patient's history. Some of the larger lesions include: Right anterior frontal 4.8 x 4.9 x 5 cm mass with marked surrounding vasogenic edema. Cannot exclude extension into the superior right orbital region as bone of the orbital roof may be destroyed. This causes significant mass effect upon the right frontal horn of the lateral ventricular system which is displaced posteriorly and to the right. Left temporal lobe 4.8 x 3.2 x 3.1 cm mass with marked surrounding vasogenic edema and flattening of the left temporal horn. Superior right cerebellar 1.8 cm and 1.4 cm mass with surrounding vasogenic edema and mild compression the right lateral aspect of the fourth ventricle. Blood or proteinaceous material layering within the deep dependent aspect of the lateral ventricles suggesting breakthrough of  hemorrhagic metastatic lesion into the ventricular system. Primary intracranial hemorrhage is a secondary less likely consideration. Abnormal appearance of the sulci on FLAIR imaging may be related to oxygen saturation secondary to the fact this was performed under general anesthesia limiting detection of the possibility of subarachnoid hemorrhage. In addition to significant distortion of the lateral ventricle by metastatic disease, mild hydrocephalus may be present. Major intracranial vascular structures are patent. No acute thrombotic infarct. IMPRESSION: Numerous intracranial masses within the supratentorial and infratentorial region numbering approximately 25 containing blood breakdown products or melanin suggestive of metastatic melanoma given patient's history. Some of the larger lesions include: Right anterior frontal 4.8 x 4.9 x 5 cm mass with marked surrounding vasogenic edema. Cannot exclude extension into the superior right orbital region as bone of the orbital roof may be destroyed. This causes significant mass effect upon the right frontal horn of the lateral ventricular system which is displaced posteriorly and to the right. Left temporal lobe 4.8 x 3.2 x 3.1 cm  mass with marked surrounding vasogenic edema and flattening of the left temporal horn. Superior right cerebellar 1.8 cm and 1.4 cm mass with surrounding vasogenic edema and mild compression the right lateral aspect of the fourth ventricle. Blood or proteinaceous material layering within the deep dependent aspect of the lateral ventricles suggesting breakthrough of hemorrhagic metastatic lesion into the ventricular system. Primary intracranial hemorrhage is a secondary less likely consideration. Abnormal appearance of the sulci on FLAIR imaging may be related to oxygen saturation secondary to the fact this was performed under general anesthesia limiting detection of the possibility of subarachnoid hemorrhage. In addition to significant  distortion of the lateral ventricle by metastatic disease, mild hydrocephalus may be present. These results will be called to the ordering clinician or representative by the Radiologist Assistant, and communication documented in the PACS or zVision Dashboard. Electronically Signed   By: Genia Del M.D.   On: 11/26/2015 15:32   Ct Abdomen Pelvis W Contrast  Result Date: 11/27/2015 CLINICAL DATA:  Metastatic melanoma. EXAM: CT CHEST, ABDOMEN, AND PELVIS WITH CONTRAST TECHNIQUE: Multidetector CT imaging of the chest, abdomen and pelvis was performed following the standard protocol during bolus administration of intravenous contrast. CONTRAST:  111mL ISOVUE-300 IOPAMIDOL (ISOVUE-300) INJECTION 61% COMPARISON:  None. FINDINGS: CT CHEST FINDINGS Mediastinum/Lymph Nodes: The heart size appears normal. No pericardial effusion. Aortic atherosclerosis noted. LAD coronary artery calcification is identified. The trachea appears patent and is midline. Unremarkable appearance of the esophagus. No enlarged mediastinal, axillary or supraclavicular lymph nodes. Lungs/Pleura: No pleural effusion identified. Pulmonary nodule in the left upper lobe measures 2.4 cm, image 51 of series 4. Scarring noted within both lower lobes. Musculoskeletal: No chest wall mass or suspicious bone lesions identified. CT ABDOMEN PELVIS FINDINGS Hepatobiliary: There are 2 low-attenuation foci within the liver. The largest measure 9 mm. These are too small to reliably characterize. The gallbladder appears normal. No biliary dilatation. Pancreas: No mass, inflammatory changes, or other significant abnormality. Spleen: Within normal limits in size and appearance. Adrenals/Urinary Tract: Normal appearance of the adrenal glands. Striated nephrographic appearance of the right kidney is identified, image number 18 of series 8. The urinary bladder appears normal. Stomach/Bowel: Small hiatal hernia. There is no pathologic dilatation of the small bowel loops.  Numerous colonic diverticula are identified. No acute inflammation. Vascular/Lymphatic: Calcified atherosclerotic disease involves the abdominal aorta. No aneurysm. No enlarged upper abdominal lymph nodes identified. No pelvic or inguinal adenopathy. Reproductive: Prostate gland enlargement noted. Other: There is no ascites or focal fluid collections within the abdomen or pelvis. Musculoskeletal:  No aggressive lytic or sclerotic bone lesions. IMPRESSION: 1. Pulmonary nodule within the left upper lobe is identified and suspicious for metastases. 2. No specific findings identified to suggest metastatic disease to the abdomen or pelvis. 3. Striated nephrographic appearance of the right kidney. This is a nonspecific finding and may be seen with pyelonephritis as well as vascular abnormality such as embolic phenomenon. Clinical correlation suggested. Electronically Signed   By: Kerby Moors M.D.   On: 11/27/2015 17:46       IMPRESSION:  The patient has a history of melanoma and is presenting with numerous brain metastases consistent with stage IV disease. Systemically, the patient has an apparent pulmonary metastasis on CT imaging but does not have a significant metastatic burden extracranially at this time. The patient has numerous metastases and a couple of these are quite large. I believe it would be reasonable to proceed with whole brain radiation treatment. Further treatment for his brain metastases  could be further considered depending on his response. Hopefully, the patient will also have good options in terms of systemic treatment and this will be discussed further with medical oncology.   PLAN:  -The patient is a good candidate for whole brain radiation treatment and wishes to proceed with this. He will undergo a simulation at 10:00 Monday at [facility] and we will likely begin a 2 week course of treatment Monday as well.  -medical oncology consultation non-urgently     ________________________________   Jodelle Gross, MD, PhD   **Disclaimer: This note was dictated with voice recognition software. Similar sounding words can inadvertently be transcribed and this note may contain transcription errors which may not have been corrected upon publication of note.**

## 2015-11-29 NOTE — Progress Notes (Signed)
Physical Therapy Treatment Patient Details Name: Chad Ball MRN: [id number] DOB: 1946-01-02 Today's Date: 11/29/2015    History of Present Illness 70 y.o. male with medical history significant for CAD s/p MI s/p stent , arthritis, GERD, recent diagnosis of melanoma 1 month ago ehich was to be followed as an outpatient, brought  via [facility]  for furtherworkup of metastatic brain lesions.CT of the head was performed, revealing multiple intracranial lesions, in the posterior foci. MRI brain confirmed these findings, including  at least 2 separate lesions in the right cerebellum and bilateral temporal lobe lesions. CT of the chest abdomen and pelvis showed 2 low-density nodules in the liver, most likely cysts, and a CT of the chest showed a 2.4 x 1.8 cm left upper lobe nodule, with pathologic lymphadenopathy in the left hilum.  Plan is to proceed with radiation therapy.     PT Comments    Pt continues to have decreased tolerance of being upright, however, he is mildly improved over last session.  I would like to try safe two person assist gait with bil hand held assist and possibly chair to follow by a third person to be safe as he will likely have very poor endurance.  If that goes well, we can transition to a RW.  I am fearful he is not safe to be up OOB as quickly as he moves and as weak as he is, he would likely try to get up out of the recliner chair faster than anyone could stop him.   PT to follow acutely for deficits listed below.     Follow Up Recommendations  CIR     Equipment Recommendations  Rolling walker with 5" wheels;3in1 (PT);Wheelchair (measurements PT);Wheelchair cushion (measurements PT);Hospital bed    Recommendations for Other Services Rehab consult     Precautions / Restrictions Precautions Precautions: Fall Precaution Comments: generally weak Restrictions Weight Bearing Restrictions: No    Mobility  Bed Mobility Overal bed mobility: Needs Assistance Bed  Mobility: Supine to Sit;Sit to Supine     Supine to sit: Min assist Sit to supine: Min assist   General bed mobility comments: Min assist to help support trunk and legs during transitions.   Transfers Overall transfer level: Needs assistance   Transfers: Sit to/from Stand Sit to Stand: Min assist         General transfer comment: Heavy min assist to stand multiple times using the sara stedy standing frame to help stand longer and more safely with only one person assisting.  Pt was able to stand multiple times for 1-1.5 mins each time.  Pt fatigues quickly and starts to report lightheadedness.  Orthostatic BPs taken and were (+).  Encouraged his wife to put the head of the bed up more so that his body can regulate to being more upright.    Ambulation/Gait             General Gait Details: Not currently safe without a skilled set of second hands to assist.           Balance Overall balance assessment: Needs assistance Sitting-balance support: Feet supported;Bilateral upper extremity supported Sitting balance-Leahy Scale: Fair Sitting balance - Comments: Pt with flexed trunk EOB resting on bil hands, fatigues quickly and wants to lay back down, needs encouragement to stay up. Also reporting ligheadedness in sitting.    Standing balance support: Bilateral upper extremity supported Standing balance-Leahy Scale: Poor Standing balance comment: Pt able to stand, but needs significant external assist to  do so safely.                     Cognition Arousal/Alertness: Awake/alert Behavior During Therapy: Flat affect Overall Cognitive Status: Impaired/Different from baseline Area of Impairment: Orientation;Attention;Memory;Following commands;Safety/judgement;Awareness;Problem solving Orientation Level: Disoriented to;Person;Place;Time;Situation (could not tell me his wife's name or relation today) Current Attention Level: Focused Memory: Decreased short-term  memory Following Commands: Follows one step commands consistently;Follows multi-step commands inconsistently Safety/Judgement: Decreased awareness of deficits Awareness: Intellectual Problem Solving: Difficulty sequencing;Requires verbal cues;Requires tactile cues;Decreased initiation General Comments: Pt is more awake/alert today, however, he is not able to identify even his wife today calling her by his sister-in-law's name and stating that she is his sister in law.             Pertinent Vitals/Pain Pain Assessment: No/denies pain (wife reports he had a HA earlier and was given meds) Pain Intervention(s): Monitored during session           PT Goals (current goals can now be found in the care plan section) Acute Rehab PT Goals Patient Stated Goal: unable to state, wife wants him to transfer to [facility], get radiation, and get home.  Progress towards PT goals: Progressing toward goals    Frequency  Min 3X/week    PT Plan Current plan remains appropriate       End of Session   Activity Tolerance: Patient limited by fatigue Patient left: in bed;with call bell/phone within reach;with family/visitor present     Time: 1454-1531 PT Time Calculation (min) (ACUTE ONLY): 37 min  Charges:  $Therapeutic Activity: 23-37 mins                      Everlean Bucher B. [place], [place], DPT [phone]   11/29/2015, 3:39 PM

## 2015-11-29 NOTE — Progress Notes (Signed)
Occupational Therapy Treatment Patient Details Name: Chad Ball MRN: [id number] DOB: December 30, 1945 Today's Date: 11/29/2015    History of present illness 70 y.o. male with medical history significant for CAD s/p MI s/p stent , arthritis, GERD, recent diagnosis of melanoma 1 month ago ehich was to be followed as an outpatient, brought  via [facility]  for furtherworkup of metastatic brain lesions.CT of the head was performed, revealing multiple intracranial lesions, in the posterior foci. MRI brain confirmed these findings, including  at least 2 separate lesions in the right cerebellum and bilateral temporal lobe lesions. CT of the chest abdomen and pelvis showed 2 low-density nodules in the liver, most likely cysts, and a CT of the chest showed a 2.4 x 1.8 cm left upper lobe nodule, with pathologic lymphadenopathy in the left hilum   OT comments  Pt with decline since last tx session and required encouragement to participate in therapy. Pt still with cognitive deficits although pt's wife and son state that today has been his best day. Pt with significant weakness and low endurance. Pt unable tolerate sitting EOB 3 minutes and that required encouragement and UB support for balance with functional tasks. OT will continue to follow acutley  Follow Up Recommendations  CIR;Supervision/Assistance - 24 hour    Equipment Recommendations  Other (comment) (TBD)    Recommendations for Other Services      Precautions / Restrictions Precautions Precautions: Fall Precaution Comments: generally weak Restrictions Weight Bearing Restrictions: No       Mobility Bed Mobility Overal bed mobility: Needs Assistance Bed Mobility: Supine to Sit;Sit to Supine     Supine to sit: Mod assist Sit to supine: Mod assist      Transfers                 General transfer comment: pt unable to stand due to fatigue, low endruance, weakness    Balance Overall balance assessment: Needs assistance    Sitting balance-Leahy Scale: Poor         Standing balance comment: pt unable to stand due to fatigue, low endruance                   ADL Overall ADL's : Needs assistance/impaired Eating/Feeding: Bed level Eating/Feeding Details (indicate cue type and reason): HOB elevated. wife feeding pt upon entering room Grooming: Wash/dry hands;Wash/dry face;Minimal assistance;Sitting Grooming Details (indicate cue type and reason): verbal and tactile cues to sustain attention to task                   Toilet Transfer Details (indicate cue type and reason): pt unable to stand due to fatigue, low endruance, weakness           General ADL Comments: pt followed 1 step commands inconsistently and required encouragement to initiate sitting EOB with attemting to return to supine . Required encouragement to sit EOB > 3 minutes      Vision  wears glasses, no change from baseline                              Cognition   Behavior During Therapy: Flat affect Overall Cognitive Status: Impaired/Different from baseline Area of Impairment: Orientation;Attention;Memory;Following commands;Safety/judgement;Awareness;Problem solving Orientation Level: Disoriented to;Place;Time;Situation   Memory: Decreased short-term memory  Following Commands: Follows one step commands inconsistently;Follows multi-step commands inconsistently Safety/Judgement: Decreased awareness of deficits Awareness: Intellectual Problem Solving: Difficulty sequencing;Requires verbal cues;Requires tactile cues;Decreased initiation General Comments:  pt's family states today has been his best day as far as memory, talking and attention    Extremity/Trunk Assessment   generalized weakness, Poor trunk control (weakness, low endurance)                        General Comments  Pt pleasant and cooperative    Pertinent Vitals/ Pain       Pain Assessment: No/denies pain Pain Intervention(s):  Monitored during session  Home Living  home with wife                                        Prior Functioning/Environment  independent            Frequency Min 2X/week     Progress Toward Goals  OT Goals(current goals can now be found in the care plan section)  Progress towards OT goals: OT to reassess next treatment     Plan Frequency remains appropriate                     End of Session     Activity Tolerance Patient limited by fatigue   Patient Left in bed;with call bell/phone within reach;with family/visitor present             Time: 1302-1320 OT Time Calculation (min): 18 min  Charges: OT General Charges $OT Visit: 1 Procedure OT Treatments $Therapeutic Activity: 8-22 mins  Britt Bottom 11/29/2015, 2:39 PM

## 2015-11-29 NOTE — Progress Notes (Signed)
[facility] TEAM 1 - Stepdown/ICU TEAM  TEX HEIMER  [id number] DOB: [birth year] DOA: 11/24/2015 PCP: No primary care provider on file.    Brief Narrative:  70 y.o. male with history of CAD w/ MI s/p stent, arthritis, GERD, and a diagnosis of melanoma 1 month prior which was to be followed as an outpatient who was brought to [facility] via [facility]  for furtherworkup of suspected metastatic brain lesions.  The patient had an episode of acute confusion while visiting a relative in [place], and had been experiencing severe, constant headaches around the temporal and frontal area for about one week.   Upon admission to [facility], the patient was incoherent.  A CT of the head revealed multiple intracranial lesions. MRI brain confirmed these findings, including at least 2 separate lesions in the right cerebellum and bilateral temporal lobe lesions. CT of the chest abdomen and pelvis showed 2 low-density nodules in the liver, most likely cysts, and a CT of the chest showed a 2.4 x 1.8 cm left upper lobe nodule, with pathologic lymphadenopathy in the left hilum.  He received IV steroids with some improvement in his mental status. Because he lives in [place], he was transferred to [facility] for Neurosurgical evaluation in order to obtain the proper tissue diagnosis for treatment options.   Subjective: The patient is much more alert and interactive at this time though he is somewhat confused.  He denies chest pain shortness breath fevers chills or even headache.  He does report poor appetite.  I spoke with Dr. Lisbeth Renshaw with radiation oncology who is scheduled to visit the patient this evening in his hospital room.  At this time the plan is to initiate whole brain radiation with the first visit for planning in the treatment center Monday morning.  I will plan to transfer the patient to [facility] Sunday to facilitate his ongoing care as presently I suspect he is too confused and too weak to  be stable for discharge home.  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  suspected metastatic melanoma - cont decadron and AED prophylaxis - CT scan chest abdomen and pelvis revealed only 1 modest sized lesion in the left upper lobe and surprisingly no other systemic lesions - after further consideration neurosurgery is not felt to be appropriate/indicated - radiation oncology is to see the patient today - tentative plan is to begin whole brain radiation treatment process on Monday at [facility] as an inpatient  CAD - s/p stent Holding ASA for now - stable presently - no reported recent history of anginal symptoms or symptoms to suggest CHF  Hyperlipidemia Continue home statin  GERD Continue PPI  Goals of Care At present the family is in favor of aggressive treatment - given the fact that this was just recently a highly functional individual I do not feel this is unreasonable - family has asked me what the possibility of "success" is and I have made it clear to them that I am not in a position to provide any reliable information in regard to that - hopefully the radiation oncologist will be able to give them more specific figures - they have been informed of the grave nature of his condition and the high possibility of his death even with treatment - the daughter has contacted her father's attorney who has informed her that he does have an established healthcare power of attorney - this paperwork is to be faxed  to the chart - at present I feel that he should be involved in the decision making process but I do not feel that he is competent to make those decisions on his own and with therefore wish to clear everything by his healthcare power of attorney as well  DVT prophylaxis: SCDs Code Status: FULL CODE Family Communication: Spoke with wife of 3 years at bedside Disposition Plan: SDU - plan to transfer to [facility] over the  weekend  Consultants:  NS Radiation Oncology  Procedures: none  Antimicrobials:  none   Objective: Blood pressure (!) 127/92, pulse (!) 54, temperature 98 F (36.7 C), temperature source Oral, resp. rate 13, height 6\' 1"  (1.854 m), weight 81.6 kg (180 lb), SpO2 95 %.  Intake/Output Summary (Last 24 hours) at 11/29/15 1516 Last data filed at 11/29/15 0900  Gross per 24 hour  Intake          1569.17 ml  Output              975 ml  Net           594.17 ml   Filed Weights   11/24/15 1622 11/26/15 1153  Weight: 76 kg (167 lb 8.8 oz) 81.6 kg (180 lb)    Examination: General: No acute respiratory distress Lungs: Clear to auscultation bilaterally without wheezes  Cardiovascular: Regular rate and rhythm without murmur  Abdomen: Nondistended, soft, bowel sounds positive, no rebound, no ascites, no appreciable mass Extremities: No significant cyanosis, clubbing, or edema bilateral lower extremities  CBC:  Recent Labs Lab 11/24/15 1649 11/25/15 0450 11/29/15 0451  WBC 23.9* 18.9* 19.9*  NEUTROABS 21.9*  --  18.2*  HGB 15.4 15.4 15.1  HCT 45.8 46.2 45.6  MCV 91.1 92.4 91.4  PLT 324 286 [id number]   Basic Metabolic Panel:  Recent Labs Lab 11/24/15 1649 11/25/15 0450 11/29/15 0451  NA 136 138 135  K 3.7 3.6 4.1  CL 102 103 102  CO2 25 26 24   GLUCOSE 148* 150* 133*  BUN 26* 26* 26*  CREATININE 0.93 0.92 0.83  CALCIUM 9.4 9.3 8.6*  MG  --   --  2.2   GFR: Estimated Creatinine Clearance: 94.9 mL/min (by C-G formula based on SCr of 0.83 mg/dL).  Liver Function Tests:  Recent Labs Lab 11/24/15 1649 11/29/15 0451  AST 32 28  ALT 28 25  ALKPHOS 81 73  BILITOT 1.3* 1.7*  PROT 7.2 5.8*  ALBUMIN 3.8 3.0*    Coagulation Profile:  Recent Labs Lab 11/24/15 1649 11/29/15 0451  INR 1.04 0.99   HbA1C: Hgb A1c MFr Bld  Date/Time Value Ref Range Status  11/24/2015 05:30 AM 5.4 4.8 - 5.6 % Final    Comment:    (NOTE)         Pre-diabetes: 5.7 - 6.4          Diabetes: >6.4         Glycemic control for adults with diabetes: <7.0     CBG:  Recent Labs Lab 11/28/15 2059 11/28/15 2345 11/29/15 0416 11/29/15 0748 11/29/15 1124  GLUCAP 139* 140* 130* 142* 133*    Recent Results (from the past 240 hour(s))  MRSA PCR Screening     Status: None   Collection Time: 11/24/15  4:23 PM  Result Value Ref Range Status   MRSA by PCR NEGATIVE NEGATIVE Final    Comment:        The GeneXpert MRSA Assay (FDA approved for NASAL specimens only), is one  component of a comprehensive MRSA colonization surveillance program. It is not intended to diagnose MRSA infection nor to guide or monitor treatment for MRSA infections.   Culture, blood (routine x 2)     Status: None (Preliminary result)   Collection Time: 11/26/15  5:18 PM  Result Value Ref Range Status   Specimen Description BLOOD LEFT ANTECUBITAL  Final   Special Requests IN PEDIATRIC BOTTLE 2CC  Final   Culture NO GROWTH 3 DAYS  Final   Report Status PENDING  Incomplete  Culture, blood (routine x 2)     Status: None (Preliminary result)   Collection Time: 11/26/15  5:20 PM  Result Value Ref Range Status   Specimen Description BLOOD RIGHT HAND  Final   Special Requests BOTTLES DRAWN AEROBIC AND ANAEROBIC 5CC  Final   Culture NO GROWTH 3 DAYS  Final   Report Status PENDING  Incomplete     Scheduled Meds: . atorvastatin  80 mg Oral Daily  . carvedilol  3.125 mg Oral BID WC  . dexamethasone  4 mg Intravenous Q6H  . famotidine (PEPCID) IV  20 mg Intravenous Q12H  . insulin aspart  0-9 Units Subcutaneous Q4H  . levETIRAcetam  750 mg Intravenous Q12H  . multivitamin with minerals  1 tablet Oral Daily  . sodium chloride flush  3 mL Intravenous Q12H     LOS: 5 days   Cherene Altes, MD [facility] Office  [phone] Pager - Text Page per Amion as per below:  On-Call/Text Page:      [email or web address]      password [email or web address]  If 7PM-7AM, please contact  night-coverage [email or web address] Password [email or web address] 11/29/2015, 3:16 PM

## 2015-11-29 NOTE — Care Management Important Message (Signed)
Important Message  Patient Details  Name: Chad Ball MRN: [id number] Date of Birth: 12-27-1945   Medicare Important Message Given:  Yes    [name] 11/29/2015, 11:10 AM

## 2015-11-30 LAB — GLUCOSE, CAPILLARY
GLUCOSE-CAPILLARY: 110 mg/dL — AB (ref 65–99)
GLUCOSE-CAPILLARY: 123 mg/dL — AB (ref 65–99)
GLUCOSE-CAPILLARY: 130 mg/dL — AB (ref 65–99)
GLUCOSE-CAPILLARY: 133 mg/dL — AB (ref 65–99)
GLUCOSE-CAPILLARY: 145 mg/dL — AB (ref 65–99)
Glucose-Capillary: 158 mg/dL — ABNORMAL HIGH (ref 65–99)

## 2015-11-30 MED ORDER — ADULT MULTIVITAMIN W/MINERALS CH: 1.0000 | ORAL_TABLET | Freq: Every day | ORAL | Status: DC

## 2015-11-30 MED ORDER — INSULIN ASPART 100 UNIT/ML ~~LOC~~ SOLN
0.0000 [IU] | Freq: Three times a day (TID) | SUBCUTANEOUS | Status: DC
  Administered 2015-11-30: 2 [IU] via SUBCUTANEOUS
  Administered 2015-12-01 – 2015-12-04 (×11): 1 [IU] via SUBCUTANEOUS
  Administered 2015-12-04: 2 [IU] via SUBCUTANEOUS
  Administered 2015-12-05: 1 [IU] via SUBCUTANEOUS

## 2015-11-30 MED ORDER — INSULIN ASPART 100 UNIT/ML ~~LOC~~ SOLN: 0.0000 [IU] | Freq: Every day | SUBCUTANEOUS | Status: DC

## 2015-11-30 MED ORDER — LEVETIRACETAM 750 MG PO TABS
750.0000 mg | ORAL_TABLET | Freq: Two times a day (BID) | ORAL | Status: DC
  Administered 2015-11-30 – 2015-12-05 (×10): 750 mg via ORAL
  Filled 2015-11-30 (×10): qty 1

## 2015-11-30 NOTE — Progress Notes (Signed)
Patient to transfer to [facility] 3W room 1343-01.  Report given to receiving charge nurse all questions answered at this time.  Pt. VSS with no s/s of distress noted.  Pt. Stable for transfer.

## 2015-11-30 NOTE — Progress Notes (Signed)
Chad Ball TEAM 1 - Stepdown/ICU TEAM  Chad Ball  [id number] DOB: 11/12/1945 DOA: 11/24/2015 PCP: No primary care provider on file.    Brief Narrative:  70 y.o. male with history of CAD w/ MI s/p stent, arthritis, GERD, and a diagnosis of melanoma on the chest status post resection 1 month prior who was to be followed as an outpatient who was brought to [facility] via [facility]  for further workup of suspected metastatic brain lesions.  The patient had an episode of acute confusion while visiting a relative in [place], and had been experiencing severe, constant headaches around the temporal and frontal area for about one week.   Upon admission to [facility], the patient was incoherent.  A CT of the head revealed multiple intracranial lesions. MRI brain confirmed these findings, including at least 2 separate lesions in the right cerebellum and bilateral temporal lobe lesions. CT of the chest abdomen and pelvis showed 2 low-density nodules in the liver, most likely cysts, and a CT of the chest showed a 2.4 x 1.8 cm left upper lobe nodule, with pathologic lymphadenopathy in the left hilum.  He received IV steroids with some improvement in his mental status. Because he lives in [place], he was transferred to [facility] for Neurosurgical evaluation in order to obtain the proper tissue diagnosis for treatment options.   Subjective: The patient reportedly rested comfortably last night.  He is more alert and interactive today but mildly confused.  He denies headache chest pain shortness breath fevers chills nausea or vomiting.  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  suspected metastatic melanoma - cont decadron and AED prophylaxis - CT scan chest abdomen and pelvis revealed only 1 modest sized lesion in the left upper lobe and surprisingly no other systemic lesions - after further consideration neurosurgery is not felt to be  appropriate/indicated - Radiation Oncology has evaluated the patient and plans to begin treatment Monday morning with whole brain radiation - I will arrange for the patient to be transferred to the oncology floor at [facility] today in preparation for this - one settled in at [facility] he will also need a Med Onc consultation to determine if he is a candidate for immunotherapy   CAD - s/p stent Holding ASA for now - stable presently - no reported recent history of anginal symptoms or symptoms to suggest CHF  Hyperlipidemia Continue home statin  GERD Continue PPI  Goals of Care At present the family is in favor of aggressive treatment - given the fact that this was just recently a highly functional individual I do not feel this is unreasonable - family has asked me what the possibility of "success" is and I have made it clear to them that I am not in a position to provide any reliable information in regard to that - hopefully the Radiation Oncologist and Medical Oncologist will be able to give them more specific figures - they have been informed of the grave nature of his condition and the high possibility of his death even with treatment - the daughter has contacted her father's attorney who has informed her that he does have an established healthcare power of attorney - this paperwork is to be faxed to the chart - at present I feel that he should be involved in the decision making process but I do not feel that he is competent to make those decisions on his own and with therefore  wish to clear everything by his healthcare power of attorney as well  DVT prophylaxis: SCDs Code Status: FULL CODE Family Communication: Spoke with wife of 3 years at bedside Disposition Plan: Can be downgraded to telemetry bed - transfer to [place] to begin whole brain radiation Monday - continue PT/OT - consult Medical Oncology once situated at [facility] (Monday or Tuesday)  Consultants:  NS Radiation  Oncology  Procedures: none  Antimicrobials:  none   Objective: Blood pressure 120/84, pulse 65, temperature 97.6 F (36.4 C), temperature source Oral, resp. rate 14, height 6\' 1"  (1.854 m), weight 81.6 kg (180 lb), SpO2 94 %.  Intake/Output Summary (Last 24 hours) at 11/30/15 1128 Last data filed at 11/30/15 0757  Gross per 24 hour  Intake              163 ml  Output             1775 ml  Net            -1612 ml   Filed Weights   11/24/15 1622 11/26/15 1153  Weight: 76 kg (167 lb 8.8 oz) 81.6 kg (180 lb)    Examination: General: No acute respiratory distress - alert but mildly confused Lungs: Clear to auscultation bilaterally without wheezes or crackles Cardiovascular: Regular rate and rhythm  Abdomen: Nondistended, soft, bowel sounds positive, no rebound, no ascites, no appreciable mass Extremities: No significant cyanosis, clubbing, edema bilateral lower extremities  CBC:  Recent Labs Lab 11/24/15 1649 11/25/15 0450 11/29/15 0451  WBC 23.9* 18.9* 19.9*  NEUTROABS 21.9*  --  18.2*  HGB 15.4 15.4 15.1  HCT 45.8 46.2 45.6  MCV 91.1 92.4 91.4  PLT 324 286 [id number]   Basic Metabolic Panel:  Recent Labs Lab 11/24/15 1649 11/25/15 0450 11/29/15 0451  NA 136 138 135  K 3.7 3.6 4.1  CL 102 103 102  CO2 25 26 24   GLUCOSE 148* 150* 133*  BUN 26* 26* 26*  CREATININE 0.93 0.92 0.83  CALCIUM 9.4 9.3 8.6*  MG  --   --  2.2   GFR: Estimated Creatinine Clearance: 94.9 mL/min (by C-G formula based on SCr of 0.83 mg/dL).  Liver Function Tests:  Recent Labs Lab 11/24/15 1649 11/29/15 0451  AST 32 28  ALT 28 25  ALKPHOS 81 73  BILITOT 1.3* 1.7*  PROT 7.2 5.8*  ALBUMIN 3.8 3.0*    Coagulation Profile:  Recent Labs Lab 11/24/15 1649 11/29/15 0451  INR 1.04 0.99   HbA1C: Hgb A1c MFr Bld  Date/Time Value Ref Range Status  11/24/2015 05:30 AM 5.4 4.8 - 5.6 % Final    Comment:    (NOTE)         Pre-diabetes: 5.7 - 6.4         Diabetes: >6.4          Glycemic control for adults with diabetes: <7.0     CBG:  Recent Labs Lab 11/29/15 1604 11/29/15 2057 11/30/15 0002 11/30/15 0326 11/30/15 0759  GLUCAP 131* 141* 123* 145* 133*    Recent Results (from the past 240 hour(s))  MRSA PCR Screening     Status: None   Collection Time: 11/24/15  4:23 PM  Result Value Ref Range Status   MRSA by PCR NEGATIVE NEGATIVE Final    Comment:        The GeneXpert MRSA Assay (FDA approved for NASAL specimens only), is one component of a comprehensive MRSA colonization surveillance program. It is not  intended to diagnose MRSA infection nor to guide or monitor treatment for MRSA infections.   Culture, blood (routine x 2)     Status: None (Preliminary result)   Collection Time: 11/26/15  5:18 PM  Result Value Ref Range Status   Specimen Description BLOOD LEFT ANTECUBITAL  Final   Special Requests IN PEDIATRIC BOTTLE 2CC  Final   Culture NO GROWTH 3 DAYS  Final   Report Status PENDING  Incomplete  Culture, blood (routine x 2)     Status: None (Preliminary result)   Collection Time: 11/26/15  5:20 PM  Result Value Ref Range Status   Specimen Description BLOOD RIGHT HAND  Final   Special Requests BOTTLES DRAWN AEROBIC AND ANAEROBIC 5CC  Final   Culture NO GROWTH 3 DAYS  Final   Report Status PENDING  Incomplete     Scheduled Meds: . atorvastatin  80 mg Oral Daily  . carvedilol  3.125 mg Oral BID WC  . dexamethasone  4 mg Intravenous Q6H  . insulin aspart  0-9 Units Subcutaneous Q4H  . levETIRAcetam  750 mg Intravenous Q12H  . multivitamin with minerals  1 tablet Oral Daily  . pantoprazole  40 mg Oral Q1200  . sodium chloride flush  3 mL Intravenous Q12H     LOS: 6 days   Cherene Altes, MD [facility] Office  [phone] Pager - Text Page per Amion as per below:  On-Call/Text Page:      [email or web address]      password [email or web address]  If 7PM-7AM, please contact night-coverage [email or web address] Password [email or web address] 11/30/2015, 11:28  AM

## 2015-11-30 NOTE — Progress Notes (Signed)
Patient received from [facility], patient alert to self, VS stable, will continue to monitor.

## 2015-12-01 LAB — BASIC METABOLIC PANEL
Anion gap: 7 (ref 5–15)
BUN: 31 mg/dL — ABNORMAL HIGH (ref 6–20)
CALCIUM: 8.6 mg/dL — AB (ref 8.9–10.3)
CHLORIDE: 107 mmol/L (ref 101–111)
CO2: 25 mmol/L (ref 22–32)
CREATININE: 0.84 mg/dL (ref 0.61–1.24)
GFR calc Af Amer: >60 mL/min (ref >60)
GFR calc non Af Amer: >60 mL/min (ref >60)
GLUCOSE: 136 mg/dL — AB (ref 65–99)
Potassium: 4.3 mmol/L (ref 3.5–5.1)
Sodium: 139 mmol/L (ref 135–145)

## 2015-12-01 LAB — CULTURE, BLOOD (ROUTINE X 2)
CULTURE: NO GROWTH
Culture: NO GROWTH

## 2015-12-01 LAB — GLUCOSE, CAPILLARY
GLUCOSE-CAPILLARY: 145 mg/dL — AB (ref 65–99)
GLUCOSE-CAPILLARY: 132 mg/dL — AB (ref 65–99)
Glucose-Capillary: 135 mg/dL — ABNORMAL HIGH (ref 65–99)
Glucose-Capillary: 146 mg/dL — ABNORMAL HIGH (ref 65–99)

## 2015-12-01 NOTE — Progress Notes (Signed)
Chad Ball  [id number] DOB: 1946-03-25 DOA: 11/24/2015   PCP: No primary care provider on file.    Brief Narrative:  70 y.o. male with history of CAD w/ MI s/p stent, arthritis, GERD, and a diagnosis of melanoma on the chest status post resection 1 month prior who was to be followed as an outpatient who was brought to [facility] via [facility]  for further workup of suspected metastatic brain lesions.  The patient had an episode of acute confusion while visiting a relative in [place], and had been experiencing severe, constant headaches around the temporal and frontal area for about one week.   Upon admission to [facility], the patient was incoherent.  A CT of the head revealed multiple intracranial lesions. MRI brain confirmed these findings, including at least 2 separate lesions in the right cerebellum and bilateral temporal lobe lesions. CT of the chest abdomen and pelvis showed 2 low-density nodules in the liver, most likely cysts, and a CT of the chest showed a 2.4 x 1.8 cm left upper lobe nodule, with pathologic lymphadenopathy in the left hilum.  He received IV steroids with some improvement in his mental status. Because he lives in [place], he was transferred to [facility] for Neurosurgical evaluation in order to obtain the proper tissue diagnosis for treatment options.   Subjective: No concerns this AM, reports feeling better and wants to eat regular food not soft diet.   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  - suspected metastatic melanoma - cont decadron and AED prophylaxis  - CT scan chest abdomen and pelvis revealed only 1 modest sized lesion in the left upper lobe   - After further consideration neurosurgery is not felt to be appropriate/indicated  - Radiation Oncology has evaluated the patient and plans to begin treatment Monday morning with whole brain radiation   CAD - s/p stent - Holding ASA  for now  - stable presently   Hyperlipidemia - Continue home statin  GERD - Continue PPI  Goals of Care - Remains full code  DVT prophylaxis: SCDs Code Status: FULL CODE Family Communication: wife at bedside this AM Disposition Plan: To be determined   Consultants:  NS Radiation Oncology  Procedures: None   Antimicrobials:  None    Objective: Blood pressure (!) 143/91, pulse (!) 53, temperature 97.7 F (36.5 C), temperature source Oral, resp. rate 16, height 6\' 1"  (1.854 m), weight 81.6 kg (180 lb), SpO2 99 %.  Intake/Output Summary (Last 24 hours) at 12/01/15 1207 Last data filed at 11/30/15 2226  Gross per 24 hour  Intake           1486.7 ml  Output              425 ml  Net           1061.7 ml   Filed Weights   11/24/15 1622 11/26/15 1153  Weight: 76 kg (167 lb 8.8 oz) 81.6 kg (180 lb)    Examination: General: No acute respiratory distress - alert and oriented to name and place  Lungs: Clear to auscultation bilaterally without wheezes or crackles Cardiovascular: Regular rate and rhythm  Abdomen: Nondistended, soft, bowel sounds positive, no rebound, no ascites, no appreciable mass Extremities: No significant cyanosis, clubbing, edema bilateral lower extremities  CBC:  Recent Labs Lab 11/24/15 1649 11/25/15 0450 11/29/15 0451  WBC 23.9* 18.9* 19.9*  NEUTROABS 21.9*  --  18.2*  HGB 15.4  15.4 15.1  HCT 45.8 46.2 45.6  MCV 91.1 92.4 91.4  PLT 324 286 [id number]   Basic Metabolic Panel:  Recent Labs Lab 11/24/15 1649 11/25/15 0450 11/29/15 0451 12/01/15 0415  NA 136 138 135 139  K 3.7 3.6 4.1 4.3  CL 102 103 102 107  CO2 25 26 24 25   GLUCOSE 148* 150* 133* 136*  BUN 26* 26* 26* 31*  CREATININE 0.93 0.92 0.83 0.84  CALCIUM 9.4 9.3 8.6* 8.6*  MG  --   --  2.2  --    Liver Function Tests:  Recent Labs Lab 11/24/15 1649 11/29/15 0451  AST 32 28  ALT 28 25  ALKPHOS 81 73  BILITOT 1.3* 1.7*  PROT 7.2 5.8*  ALBUMIN 3.8 3.0*     Coagulation Profile:  Recent Labs Lab 11/24/15 1649 11/29/15 0451  INR 1.04 0.99   HbA1C: Hgb A1c MFr Bld  Date/Time Value Ref Range Status  11/24/2015 05:30 AM 5.4 4.8 - 5.6 % Final    Comment:    (NOTE)         Pre-diabetes: 5.7 - 6.4         Diabetes: >6.4         Glycemic control for adults with diabetes: <7.0     CBG:  Recent Labs Lab 11/30/15 0759 11/30/15 1214 11/30/15 1619 11/30/15 2143 12/01/15 0801  GLUCAP 133* 130* 158* 110* 135*    Recent Results (from the past 240 hour(s))  MRSA PCR Screening     Status: None   Collection Time: 11/24/15  4:23 PM  Result Value Ref Range Status   MRSA by PCR NEGATIVE NEGATIVE Final    Comment:        The GeneXpert MRSA Assay (FDA approved for NASAL specimens only), is one component of a comprehensive MRSA colonization surveillance program. It is not intended to diagnose MRSA infection nor to guide or monitor treatment for MRSA infections.   Culture, blood (routine x 2)     Status: None (Preliminary result)   Collection Time: 11/26/15  5:18 PM  Result Value Ref Range Status   Specimen Description BLOOD LEFT ANTECUBITAL  Final   Special Requests IN PEDIATRIC BOTTLE 2CC  Final   Culture NO GROWTH 4 DAYS  Final   Report Status PENDING  Incomplete  Culture, blood (routine x 2)     Status: None (Preliminary result)   Collection Time: 11/26/15  5:20 PM  Result Value Ref Range Status   Specimen Description BLOOD RIGHT HAND  Final   Special Requests BOTTLES DRAWN AEROBIC AND ANAEROBIC 5CC  Final   Culture NO GROWTH 4 DAYS  Final   Report Status PENDING  Incomplete     Scheduled Meds: . atorvastatin  80 mg Oral Daily  . carvedilol  3.125 mg Oral BID WC  . dexamethasone  4 mg Intravenous Q6H  . insulin aspart  0-5 Units Subcutaneous QHS  . insulin aspart  0-9 Units Subcutaneous TID WC  . levETIRAcetam  750 mg Oral BID  . multivitamin with minerals  1 tablet Oral Daily  . pantoprazole  40 mg Oral Q1200  .  sodium chloride flush  3 mL Intravenous Q12H     LOS: 7 days   Faye Ramsay, MD  [facility] Pager [phone]  If 7PM-7AM, please contact night-coverage [email or web address] Password [email or web address]  If 7PM-7AM, please contact night-coverage [email or web address] Password [email or web address] 12/01/2015, 12:07 PM

## 2015-12-02 ENCOUNTER — Encounter (HOSPITAL_COMMUNITY): Payer: Self-pay

## 2015-12-02 ENCOUNTER — Ambulatory Visit
Admit: 2015-12-02 | Discharge: 2015-12-02 | Disposition: A | Discharge status: Home / Self Care | Payer: Medicare Other | Attending: Radiation Oncology | Admitting: Radiation Oncology

## 2015-12-02 ENCOUNTER — Inpatient Hospital Stay (HOSPITAL_COMMUNITY): Payer: Medicare Other

## 2015-12-02 ENCOUNTER — Telehealth: Payer: Self-pay | Admitting: *Deleted

## 2015-12-02 DIAGNOSIS — C7931 Secondary malignant neoplasm of brain: Secondary | ICD-10-CM

## 2015-12-02 LAB — BASIC METABOLIC PANEL
ANION GAP: 7 (ref 5–15)
BUN: 34 mg/dL — ABNORMAL HIGH (ref 6–20)
CALCIUM: 8.3 mg/dL — AB (ref 8.9–10.3)
CO2: 26 mmol/L (ref 22–32)
Chloride: 102 mmol/L (ref 101–111)
Creatinine, Ser: 0.87 mg/dL (ref 0.61–1.24)
Glucose, Bld: 135 mg/dL — ABNORMAL HIGH (ref 65–99)
Potassium: 4.1 mmol/L (ref 3.5–5.1)
SODIUM: 135 mmol/L (ref 135–145)

## 2015-12-02 LAB — CBC
HEMATOCRIT: 42.4 % (ref 39.0–52.0)
Hemoglobin: 14.9 g/dL (ref 13.0–17.0)
MCH: 31.4 pg (ref 26.0–34.0)
MCHC: 35.1 g/dL (ref 30.0–36.0)
MCV: 89.3 fL (ref 78.0–100.0)
PLATELETS: 186 10*3/uL (ref 150–400)
RBC: 4.75 MIL/uL (ref 4.22–5.81)
RDW: 13.3 % (ref 11.5–15.5)
WBC: 22.2 10*3/uL — AB (ref 4.0–10.5)

## 2015-12-02 LAB — GLUCOSE, CAPILLARY
GLUCOSE-CAPILLARY: 126 mg/dL — AB (ref 65–99)
GLUCOSE-CAPILLARY: 123 mg/dL — AB (ref 65–99)
GLUCOSE-CAPILLARY: 117 mg/dL — AB (ref 65–99)
Glucose-Capillary: 143 mg/dL — ABNORMAL HIGH (ref 65–99)

## 2015-12-02 MED ORDER — GADOBENATE DIMEGLUMINE 529 MG/ML IV SOLN
20.0000 mL | Freq: Once | INTRAVENOUS | Status: AC | PRN
  Administered 2015-12-02: 16 mL via INTRAVENOUS

## 2015-12-02 NOTE — Progress Notes (Signed)
Patient ID: Chad Ball, male   DOB: 15-Dec-1945, 70 y.o.   MRN: [id number]  Imaging reviewed.  Potential biopsy sites discussed with Dr. Lisbeth Renshaw. LUL lung lesion would require crossing major fissure for percutaneous biopsy, which would significantly increase risk of PTX during/after biopsy. CT is very suspicious for multiple small liver mets.  Quality of CT decreased due to scanning with arms down by sides. Would recommend MRI of abdomen to further evaluate liver, which would be a safer organ to biopsy if additional tissue needed. Dr. Lisbeth Renshaw to discuss with Med Oncology. Will hold on any tissue biopsy for now pending Oncology input.  [name]. [name], M.D Pager:  [phone]

## 2015-12-02 NOTE — Progress Notes (Signed)
Chad Ball  [id number] DOB: 10/11/1945 DOA: 11/24/2015   PCP: No primary care provider on file.    Brief Narrative:  70 y.o. male with history of CAD w/ MI s/p stent, arthritis, GERD, and a diagnosis of melanoma on the chest status post resection 1 month prior who was to be followed as an outpatient who was brought to [facility] via [facility]  for further workup of suspected metastatic brain lesions.  The patient had an episode of acute confusion while visiting a relative in [place], and had been experiencing severe, constant headaches around the temporal and frontal area for about one week.   Upon admission to [facility], the patient was incoherent.  A CT of the head revealed multiple intracranial lesions. MRI brain confirmed these findings, including at least 2 separate lesions in the right cerebellum and bilateral temporal lobe lesions. CT of the chest abdomen and pelvis showed 2 low-density nodules in the liver, most likely cysts, and a CT of the chest showed a 2.4 x 1.8 cm left upper lobe nodule, with pathologic lymphadenopathy in the left hilum.  He received IV steroids with some improvement in his mental status. Because he lives in [place], he was transferred to [facility] for Neurosurgical evaluation in order to obtain the proper tissue diagnosis for treatment options.   Subjective: No concerns this AM, reports feeling better and wants to eat regular food not soft diet.   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  - suspected metastatic melanoma - cont decadron and AED prophylaxis  - CT scan chest abdomen and pelvis revealed only 1 modest sized lesion in the left upper lobe   - After further consideration neurosurgery is not felt to be appropriate/indicated  - The patient will be planned to receive a course of whole brain radiation treatment, appreciate rad oncology team assistance  - IR consult placed for  consideration of inpatient rehab   CAD - s/p stent - Holding ASA for now  - stable presently   Leukocytosis - secondary to dexamethasone  - pt with no fevers   Hyperlipidemia - Continue home statin  GERD - Continue PPI  Goals of Care - Remains full code  DVT prophylaxis: SCDs Code Status: FULL CODE Family Communication: wife at bedside this AM Disposition Plan: To be determined   Consultants:  NS Radiation Oncology  Procedures: None   Antimicrobials:  None    Objective: Blood pressure 125/84, pulse 60, temperature 97.6 F (36.4 C), temperature source Oral, resp. rate 14, height 6\' 1"  (1.854 m), weight 81.6 kg (180 lb), SpO2 97 %.  Intake/Output Summary (Last 24 hours) at 12/02/15 1904 Last data filed at 12/02/15 1419  Gross per 24 hour  Intake              884 ml  Output              850 ml  Net               34 ml   Filed Weights   11/24/15 1622 11/26/15 1153  Weight: 76 kg (167 lb 8.8 oz) 81.6 kg (180 lb)    Examination: General: No acute respiratory distress - alert and oriented to name and place  Lungs: Clear to auscultation bilaterally without wheezes or crackles Cardiovascular: Regular rate and rhythm  Abdomen: Nondistended, soft, bowel sounds positive, no rebound, no ascites, no appreciable mass Extremities: No significant cyanosis, clubbing, edema  bilateral lower extremities  CBC:  Recent Labs Lab 11/29/15 0451 12/02/15 0339  WBC 19.9* 22.2*  NEUTROABS 18.2*  --   HGB 15.1 14.9  HCT 45.6 42.4  MCV 91.4 89.3  PLT 201 [id number]   Basic Metabolic Panel:  Recent Labs Lab 11/29/15 0451 12/01/15 0415 12/02/15 0339  NA 135 139 135  K 4.1 4.3 4.1  CL 102 107 102  CO2 24 25 26   GLUCOSE 133* 136* 135*  BUN 26* 31* 34*  CREATININE 0.83 0.84 0.87  CALCIUM 8.6* 8.6* 8.3*  MG 2.2  --   --    Liver Function Tests:  Recent Labs Lab 11/29/15 0451  AST 28  ALT 25  ALKPHOS 73  BILITOT 1.7*  PROT 5.8*  ALBUMIN 3.0*    Coagulation  Profile:  Recent Labs Lab 11/29/15 0451  INR 0.99   HbA1C: Hgb A1c MFr Bld  Date/Time Value Ref Range Status  11/24/2015 05:30 AM 5.4 4.8 - 5.6 % Final    Comment:    (NOTE)         Pre-diabetes: 5.7 - 6.4         Diabetes: >6.4         Glycemic control for adults with diabetes: <7.0     CBG:  Recent Labs Lab 12/01/15 1744 12/01/15 2201 12/02/15 0755 12/02/15 1157 12/02/15 1732  GLUCAP 145* 132* 126* 123* 143*    Recent Results (from the past 240 hour(s))  MRSA PCR Screening     Status: None   Collection Time: 11/24/15  4:23 PM  Result Value Ref Range Status   MRSA by PCR NEGATIVE NEGATIVE Final    Comment:        The GeneXpert MRSA Assay (FDA approved for NASAL specimens only), is one component of a comprehensive MRSA colonization surveillance program. It is not intended to diagnose MRSA infection nor to guide or monitor treatment for MRSA infections.   Culture, blood (routine x 2)     Status: None   Collection Time: 11/26/15  5:18 PM  Result Value Ref Range Status   Specimen Description BLOOD LEFT ANTECUBITAL  Final   Special Requests IN PEDIATRIC BOTTLE 2CC  Final   Culture NO GROWTH 5 DAYS  Final   Report Status 12/01/2015 FINAL  Final  Culture, blood (routine x 2)     Status: None   Collection Time: 11/26/15  5:20 PM  Result Value Ref Range Status   Specimen Description BLOOD RIGHT HAND  Final   Special Requests BOTTLES DRAWN AEROBIC AND ANAEROBIC 5CC  Final   Culture NO GROWTH 5 DAYS  Final   Report Status 12/01/2015 FINAL  Final     Scheduled Meds: . atorvastatin  80 mg Oral Daily  . carvedilol  3.125 mg Oral BID WC  . dexamethasone  4 mg Intravenous Q6H  . insulin aspart  0-5 Units Subcutaneous QHS  . insulin aspart  0-9 Units Subcutaneous TID WC  . levETIRAcetam  750 mg Oral BID  . multivitamin with minerals  1 tablet Oral Daily  . pantoprazole  40 mg Oral Q1200  . sodium chloride flush  3 mL Intravenous Q12H     LOS: 8 days    Faye Ramsay, MD  [facility] Pager [phone]  If 7PM-7AM, please contact night-coverage [email or web address] Password [email or web address]  If 7PM-7AM, please contact night-coverage [email or web address] Password [email or web address] 12/02/2015, 7:04 PM

## 2015-12-02 NOTE — Progress Notes (Signed)
  Radiation Oncology         ([phone]) [phone] ________________________________  Name: JHAYCE SICOTTE MRN: [id number]  Date: 12/02/2015  DOB: Mar 21, 1946    Simulation and treatment planning note  DIAGNOSIS:     ICD-9-CM ICD-10-CM   1. Brain metastasis ([place]) 198.3 C79.31      The patient presented for simulation for the patient's upcoming course of whole brain radiation treatment. The patient was placed in a supine position and a customized thermoplastic head cast was constructed to aid in patient immobilization during the treatment. This complex treatment device will be used on a daily basis. In this fashion a CT scan was obtained through the head and neck region and isocenter was placed near midline within the brain.  The patient will be planned to receive a course of whole brain radiation treatment to a dose of 30 gray in 10 fractions at 3 gray per fraction. To accomplish this, 2 customized blocks have been designed which corresponds to left and right whole brain radiation fields. These 2 complex treatment devices will be used on a daily basis during the course of radiation. A complex isodose plan is requested to insure that the target area is adequately covered in to facilitate optimization of the treatment plan. A forward planning technique will also be evaluated to determine if this approach significantly improves the plan.   ________________________________   Jodelle Gross, MD, PhD

## 2015-12-02 NOTE — Progress Notes (Signed)
Physical Therapy Treatment Patient Details Name: Chad Ball MRN: [id number] DOB: 12/31/1945 Today's Date: 12/02/2015    History of Present Illness 70 y.o. male with medical history significant for CAD s/p MI s/p stent , arthritis, GERD, recent diagnosis of melanoma 1 month ago ehich was to be followed as an outpatient, brought  via [facility]  for furtherworkup of metastatic brain lesions.CT of the head was performed, revealing multiple intracranial lesions, in the posterior foci. MRI brain confirmed these findings, including  at least 2 separate lesions in the right cerebellum and bilateral temporal lobe lesions. CT of the chest abdomen and pelvis showed 2 low-density nodules in the liver, most likely cysts, and a CT of the chest showed a 2.4 x 1.8 cm left upper lobe nodule, with pathologic lymphadenopathy in the left hilum.  Plan is to proceed with radiation therapy.     PT Comments    Multiple attempts to assist pt to sit on edge of bed, pt verbally refused and physically resisted. He came to a partial sit in order to kiss his wife but then immediately returned to supine. Despite much encouragement from pt's wife, son, RN and myself pt continued to refuse mobility.  Performed BLE exercises with assistance.   Follow Up Recommendations  CIR;SNF (may need SNF if pt continues to refuse)     Equipment Recommendations  Rolling walker with 5" wheels;3in1 (PT);Wheelchair (measurements PT);Wheelchair cushion (measurements PT);Hospital bed    Recommendations for Other Services Rehab consult     Precautions / Restrictions Precautions Precautions: Fall Precaution Comments: generally weak    Mobility  Bed Mobility         Supine to sit: Mod assist;HOB elevated     General bed mobility comments: attempted supine to sit with HOB elevated but pt resisted and verbally refused.   Transfers                    Ambulation/Gait                 Stairs             Wheelchair Mobility    Modified Rankin (Stroke Patients Only)       Balance                                    Cognition Arousal/Alertness: Awake/alert Behavior During Therapy: Flat affect Overall Cognitive Status: Impaired/Different from baseline Area of Impairment: Orientation;Attention;Memory;Following commands;Safety/judgement;Awareness;Problem solving Orientation Level: Disoriented to;Place;Time;Situation Current Attention Level: Focused Memory: Decreased short-term memory Following Commands: Follows one step commands inconsistently Safety/Judgement: Decreased awareness of deficits Awareness: Intellectual Problem Solving: Difficulty sequencing;Requires verbal cues;Requires tactile cues;Decreased initiation General Comments: pt alert and initially agreed to get out of bed, but upon initiation of movement stated he didn't "want any more", despite encouragement from son, wife, nurse, and myself pt wouldn't agree to sit up    Exercises General Exercises - Lower Extremity Ankle Circles/Pumps: AAROM;Both;10 reps;Supine Heel Slides: AAROM;Both;10 reps;Supine Hip ABduction/ADduction: AAROM;Both;10 reps;Supine    General Comments        Pertinent Vitals/Pain Faces Pain Scale: Hurts little more Pain Location: low back Pain Intervention(s): Monitored during session;Limited activity within patient's tolerance;Patient requesting pain meds-RN notified    Home Living                      Prior Function  PT Goals (current goals can now be found in the care plan section) Acute Rehab PT Goals Patient Stated Goal: wife/son want pt to participate in PT to get stronger PT Goal Formulation: With family Time For Goal Achievement: 12/11/15 Potential to Achieve Goals: Fair Progress towards PT goals: Not progressing toward goals - comment    Frequency  Min 3X/week    PT Plan Current plan remains appropriate    Co-evaluation              End of Session   Activity Tolerance: Patient limited by fatigue;Other (comment) (confusion) Patient left: in bed;with call bell/phone within reach;with bed alarm set;with family/visitor present     Time: [id number] PT Time Calculation (min) (ACUTE ONLY): 14 min  Charges:  $Therapeutic Activity: 8-22 mins                    G Codes:      Philomena Doheny 12/02/2015, 1:57 PM [phone]

## 2015-12-02 NOTE — Progress Notes (Signed)
The patient's case was presented at our multidisciplinary brain conference this morning. In attendance were Dr. Nevada Crane with neuroradiology, Dr. Orene Desanctis, Dr. Lisbeth Renshaw, and Dr. Kathyrn Sheriff. He has a history of a melanoma resected in [place] from the chest wall per report. He presented with aphasia at an outside [place]. He was found to have multiple brain lesions ~25. In reviewing his additional imaging studies he has a 2 cm lesion in the left lung and a lesion in the hilum. Recommendations were to proceed with additional biopsy of the 2 cm left lung lesion with the anticipation of needing enough tissue to proceed with PDL-1 testing. Additional planning will be determined from his pathology. There is still consideration of surgery for his larger of the brain lesions.      Carola Rhine, PAC

## 2015-12-02 NOTE — Telephone Encounter (Signed)
Called [facility] for room 1345 spoke with Abby,RN, patient to come to  radiation dept at 10:00am this am for whole brain CT simulation, he will need his wife or family member to sign consent, he does have IVF"S infusing, no Oxygen, still very much confused, she will call the wife, thanked Engineer, maintenance, called Ct sim room  Spoke with AMY and informed her of status 7:55 AM

## 2015-12-03 ENCOUNTER — Ambulatory Visit
Admit: 2015-12-03 | Discharge: 2015-12-03 | Disposition: A | Discharge status: Home / Self Care | Payer: Medicare Other | Attending: Radiation Oncology | Admitting: Radiation Oncology

## 2015-12-03 LAB — GLUCOSE, CAPILLARY
GLUCOSE-CAPILLARY: 130 mg/dL — AB (ref 65–99)
GLUCOSE-CAPILLARY: 139 mg/dL — AB (ref 65–99)
Glucose-Capillary: 148 mg/dL — ABNORMAL HIGH (ref 65–99)

## 2015-12-03 NOTE — Progress Notes (Signed)
Rehab admissions - I have received request for acute inpatient rehab consult.  I will see patient and speak with family about possible inpatient rehab admission.  Call me for questions.  [phone]

## 2015-12-03 NOTE — NC FL2 (Signed)
[place] MEDICAID FL2 LEVEL OF CARE SCREENING TOOL     IDENTIFICATION  Patient Name: Chad Ball Birthdate: August 11, 1945 Sex: male Admission Date (Current Location): 11/24/2015  [facility] and [place] Number:  Herbalist and Address:  [facility],  [place] [address], [place]      Provider Number: [id number]  Attending Physician Name and Address:  Theodis Blaze, MD  Relative Name and Phone Number:       Current Level of Care: Hospital Recommended Level of Care: [place] Prior Approval Number:    Date Approved/Denied:   PASRR Number: [id number]  Discharge Plan: SNF    Current Diagnoses: Patient Active Problem List   Diagnosis Date Noted  . Brain tumor ([place])   . Metastatic melanoma ([place])   . Brain mass 11/24/2015  . Melanoma of skin ([place]) 11/24/2015  . Brain metastasis ([place]) 11/24/2015  . Essential hypertension 09/02/2015  . Hyperlipidemia 09/02/2015  . CAD in native artery 09/02/2015    Orientation RESPIRATION BLADDER Height & Weight     Self  Normal Incontinent Weight: 180 lb (81.6 kg) Height:  6\' 1"  (185.4 cm)  BEHAVIORAL SYMPTOMS/MOOD NEUROLOGICAL BOWEL NUTRITION STATUS  Other (Comment) (no behaviors)   Incontinent Diet  AMBULATORY STATUS COMMUNICATION OF NEEDS Skin   Extensive Assist Verbally Normal                       Personal Care Assistance Level of Assistance  Bathing, Feeding, Dressing Bathing Assistance: Maximum assistance Feeding assistance: Limited assistance Dressing Assistance: Maximum assistance     Functional Limitations Info  Sight, Hearing, Speech Sight Info: Adequate Hearing Info: Adequate Speech Info: Adequate    SPECIAL CARE FACTORS FREQUENCY  PT (By licensed PT), OT (By licensed OT)     PT Frequency: 5x wk OT Frequency: 5x wk            Contractures Contractures Info: Not present    Additional Factors Info  Insulin Sliding Scale, Allergies                Current Medications (12/03/2015):  This is the current hospital active medication list Current Facility-Administered Medications  Medication Dose Route Frequency Provider Last Rate Last Dose  . acetaminophen (TYLENOL) tablet 650 mg  650 mg Oral Q6H PRN Cherene Altes, MD      . atorvastatin (LIPITOR) tablet 80 mg  80 mg Oral Daily Maren Reamer, MD   80 mg at 12/01/15 1816  . carvedilol (COREG) tablet 3.125 mg  3.125 mg Oral BID WC Cherene Altes, MD   3.125 mg at 12/02/15 0932  . dexamethasone (DECADRON) injection 4 mg  4 mg Intravenous Q6H Maren Reamer, MD   4 mg at 12/03/15 0755  . dextrose 5 %-0.9 % sodium chloride infusion   Intravenous Continuous Cherene Altes, MD 30 mL/hr at 12/02/15 1900    . insulin aspart (novoLOG) injection 0-5 Units  0-5 Units Subcutaneous QHS Cherene Altes, MD      . insulin aspart (novoLOG) injection 0-9 Units  0-9 Units Subcutaneous TID WC Cherene Altes, MD   1 Units at 12/03/15 0815  . levETIRAcetam (KEPPRA) tablet 750 mg  750 mg Oral BID Cherene Altes, MD   750 mg at 12/03/15 0956  . morphine 2 MG/ML injection 1-2 mg  1-2 mg Intravenous Q3H PRN Cherene Altes, MD   2 mg at 12/02/15 1825  . multivitamin  with minerals tablet 1 tablet  1 tablet Oral Daily Maren Reamer, MD   1 tablet at 12/03/15 [phone]  . oxyCODONE (Oxy IR/ROXICODONE) immediate release tablet 5-10 mg  5-10 mg Oral Q4H PRN Cherene Altes, MD   10 mg at 12/03/15 0755  . pantoprazole (PROTONIX) EC tablet 40 mg  40 mg Oral Q1200 Cherene Altes, MD   40 mg at 12/02/15 1248  . sodium chloride flush (NS) 0.9 % injection 3 mL  3 mL Intravenous Q12H Maren Reamer, MD   3 mL at 12/02/15 2141     Discharge Medications: Please see discharge summary for a list of discharge medications.  Relevant Imaging Results:  Relevant Lab Results:   Additional Information ss # [id number]. Pt has daily radiation appts at [facility] from 8/9 - 8/18 ( no weekends )  .  Clemence Stillings, Randall An, LCSW

## 2015-12-03 NOTE — Progress Notes (Signed)
Chad Ball  [id number] DOB: 1945-07-22 DOA: 11/24/2015   PCP: No primary care provider on file.    Brief Narrative:  70 y.o. male with history of CAD w/ MI s/p stent, arthritis, GERD, and a diagnosis of melanoma on the chest status post resection 1 month prior who was to be followed as an outpatient who was brought to [facility] via [facility]  for further workup of suspected metastatic brain lesions.  The patient had an episode of acute confusion while visiting a relative in [place], and had been experiencing severe, constant headaches around the temporal and frontal area for about one week.   Upon admission to [facility], the patient was incoherent.  A CT of the head revealed multiple intracranial lesions. MRI brain confirmed these findings, including at least 2 separate lesions in the right cerebellum and bilateral temporal lobe lesions. CT of the chest abdomen and pelvis showed 2 low-density nodules in the liver, most likely cysts, and a CT of the chest showed a 2.4 x 1.8 cm left upper lobe nodule, with pathologic lymphadenopathy in the left hilum.  He received IV steroids with some improvement in his mental status. Because he lives in [place], he was transferred to [facility] for Neurosurgical evaluation in order to obtain the proper tissue diagnosis for treatment options.   Subjective: No concerns this AM, rather somnolent this AM, says he is tired, wife says he did not eat anything.   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 Julien Nordmann, once pt completes radiation therapy he can be set up in oncology clinic provided he is clinically improving - so far pt looks rather somnolent with FTT and may need PCT input as well depending on progress   FTT - nutritionist consulted - if oral intake does not improve, consider tube feedings   CAD - s/p stent - Holding ASA for now as pt unable to take much PO  - stable presently   Leukocytosis - secondary to dexamethasone  - pt with no fevers   Hyperlipidemia - Continue home statin  GERD - Continue PPI  Goals of Care - Remains full code  DVT prophylaxis: SCDs Code Status: FULL CODE Family Communication: wife at bedside this AM Disposition Plan: Once radiation   Consultants:  Nutritionist  Inpatient rehab MD Radiation Oncology Oncologist over the phone Dr. Julien Nordmann   Procedures: None   Antimicrobials:  None    Objective: Blood pressure 126/69, pulse (!) 53, temperature 97.7 F (36.5 C), temperature source Oral, resp. rate 16, height 6\' 1"  (1.854 m), weight 81.6 kg (180 lb), SpO2 97 %.  Intake/Output Summary (Last 24 hours) at 12/03/15 0659 Last data filed at 12/02/15 1900  Gross per 24 hour  Intake              380 ml  Output              450 ml  Net              -  70 ml   Filed Weights   11/24/15 1622 11/26/15 1153  Weight: 76 kg (167 lb 8.8 oz) 81.6 kg (180 lb)    Examination: General: No acute respiratory distress - alert and oriented to name and place  Lungs: Clear to auscultation bilaterally without wheezes or crackles Cardiovascular: Regular rate and rhythm  Abdomen: Nondistended, soft, bowel sounds positive, no rebound, no ascites, no appreciable mass Extremities: No significant cyanosis, clubbing, edema bilateral lower extremities  CBC:  Recent Labs Lab 11/29/15 0451 12/02/15 0339  WBC 19.9* 22.2*  NEUTROABS 18.2*  --   HGB 15.1 14.9  HCT 45.6 42.4    MCV 91.4 89.3  PLT 201 99991111   Basic Metabolic Panel:  Recent Labs Lab 11/29/15 0451 12/01/15 0415 12/02/15 0339  NA 135 139 135  K 4.1 4.3 4.1  CL 102 107 102  CO2 24 25 26   GLUCOSE 133* 136* 135*  BUN 26* 31* 34*  CREATININE 0.83 0.84 0.87  CALCIUM 8.6* 8.6* 8.3*  MG 2.2  --   --    Liver Function Tests:  Recent Labs Lab 11/29/15 0451  AST 28  ALT 25  ALKPHOS 73  BILITOT 1.7*  PROT 5.8*  ALBUMIN 3.0*    Coagulation Profile:  Recent Labs Lab 11/29/15 0451  INR 0.99   HbA1C: Hgb A1c MFr Bld  Date/Time Value Ref Range Status  11/24/2015 05:30 AM 5.4 4.8 - 5.6 % Final    Comment:    (NOTE)         Pre-diabetes: 5.7 - 6.4         Diabetes: >6.4         Glycemic control for adults with diabetes: <7.0     CBG:  Recent Labs Lab 12/01/15 2201 12/02/15 0755 12/02/15 1157 12/02/15 1732 12/02/15 2145  GLUCAP 132* 126* 123* 143* 117*    Recent Results (from the past 240 hour(s))  MRSA PCR Screening     Status: None   Collection Time: 11/24/15  4:23 PM  Result Value Ref Range Status   MRSA by PCR NEGATIVE NEGATIVE Final    Comment:        The GeneXpert MRSA Assay (FDA approved for NASAL specimens only), is one component of a comprehensive MRSA colonization surveillance program. It is not intended to diagnose MRSA infection nor to guide or monitor treatment for MRSA infections.   Culture, blood (routine x 2)     Status: None   Collection Time: 11/26/15  5:18 PM  Result Value Ref Range Status   Specimen Description BLOOD LEFT ANTECUBITAL  Final   Special Requests IN PEDIATRIC BOTTLE 2CC  Final   Culture NO GROWTH 5 DAYS  Final   Report Status 12/01/2015 FINAL  Final  Culture, blood (routine x 2)     Status: None   Collection Time: 11/26/15  5:20 PM  Result Value Ref Range Status   Specimen Description BLOOD RIGHT HAND  Final   Special Requests BOTTLES DRAWN AEROBIC AND ANAEROBIC 5CC  Final   Culture NO GROWTH 5 DAYS  Final   Report Status  12/01/2015 FINAL  Final     Scheduled Meds: . atorvastatin  80 mg Oral Daily  . carvedilol  3.125 mg Oral BID WC  . dexamethasone  4 mg Intravenous Q6H  . insulin aspart  0-5 Units Subcutaneous QHS  . insulin aspart  0-9 Units Subcutaneous TID WC  . levETIRAcetam  750 mg Oral BID  . multivitamin with minerals  1  tablet Oral Daily  . pantoprazole  40 mg Oral Q1200  . sodium chloride flush  3 mL Intravenous Q12H     LOS: 9 days   Faye Ramsay, MD  [facility] Pager [phone]  If 7PM-7AM, please contact night-coverage [email or web address] Password [email or web address]  If 7PM-7AM, please contact night-coverage [email or web address] Password [email or web address] 12/03/2015, 6:59 AM

## 2015-12-03 NOTE — Progress Notes (Signed)
Occupational Therapy Treatment Patient Details Name: Chad Ball MRN: [id number] DOB: 05-07-1945 Today's Date: 12/03/2015    History of present illness 70 y.o. male with medical history significant for CAD s/p MI s/p stent , arthritis, GERD, recent diagnosis of melanoma 1 month ago ehich was to be followed as an outpatient, brought  via [facility]  for furtherworkup of metastatic brain lesions.CT of the head was performed, revealing multiple intracranial lesions, in the posterior foci. MRI brain confirmed these findings, including  at least 2 separate lesions in the right cerebellum and bilateral temporal lobe lesions. CT of the chest abdomen and pelvis showed 2 low-density nodules in the liver, most likely cysts, and a CT of the chest showed a 2.4 x 1.8 cm left upper lobe nodule, with pathologic lymphadenopathy in the left hilum.  Plan is to proceed with radiation therapy.    OT comments  Patient participated in simple grooming and bed mobility to EOB, then kept attempting to lie back down. Wife present and supportive. She did not remember PT coming to work with patient yesterday. OT will continue to follow. Overall slow progress towards OT goals. Cognitive deficits limiting pt more than physical deficits.    Follow Up Recommendations  CIR;Supervision/Assistance - 24 hour    Equipment Recommendations  Other (comment) (TBA)    Recommendations for Other Services      Precautions / Restrictions Precautions Precautions: Fall Precaution Comments: generally weak Restrictions Weight Bearing Restrictions: No       Mobility Bed Mobility Overal bed mobility: Needs Assistance Bed Mobility: Supine to Sit;Sit to Supine     Supine to sit: Mod assist;HOB elevated Sit to supine: Min assist   General bed mobility comments: Sat EOB for a few minutes, initial reports dizziness, then improved per pt report. Patient kept saying, "I can't do this" and tried to lie back down multiple times.  Attempted urinal use without success, then pt returned to bed and positioned comfortably.  Transfers                 General transfer comment: unable to attempt due to pt kept trying to lie back down once seated EOB    Balance                                   ADL Overall ADL's : Needs assistance/impaired     Grooming: Wash/dry hands;Wash/dry face;Moderate assistance;Cueing for sequencing;Sitting Grooming Details (indicate cue type and reason): hand over hand to initiate task; cues to sustain task Upper Body Bathing: Total assistance;Bed level   Lower Body Bathing: Total assistance;Bed level       Lower Body Dressing: Total assistance Lower Body Dressing Details (indicate cue type and reason): don socks in bed             Functional mobility during ADLs: Moderate assistance (bed mobility only) General ADL Comments: Hand over hand to wash face/hands to initiate task, cues to sustain task. ATtempted urinal use of total A at EOB. Total A don socks in bed. Wife reports multiple episodes of incontinence requiring total A for cleanup overnight.      Vision                     Perception     Praxis      Cognition   Behavior During Therapy: Flat affect Overall Cognitive Status: Impaired/Different from baseline Area of Impairment: Orientation;Attention;Memory;Following  commands;Safety/judgement;Awareness;Problem solving Orientation Level: Disoriented to;Place;Time;Situation Current Attention Level: Focused Memory: Decreased short-term memory  Following Commands: Follows one step commands inconsistently Safety/Judgement: Decreased awareness of deficits Awareness: Intellectual Problem Solving: Difficulty sequencing;Requires verbal cues;Requires tactile cues;Decreased initiation General Comments: Kept saying, "I can't do this" despite doing well with physical tasks. Kept attempting to lie back down once sitting EOB.    Extremity/Trunk  Assessment               Exercises     Shoulder Instructions       General Comments      Pertinent Vitals/ Pain       Pain Assessment: No/denies pain  Home Living                                          Prior Functioning/Environment              Frequency Min 2X/week     Progress Toward Goals  OT Goals(current goals can now be found in the care plan section)  Progress towards OT goals: Progressing toward goals  Acute Rehab OT Goals Patient Stated Goal: wife wants pt to be able to stand up  Plan Discharge plan remains appropriate    Co-evaluation                 End of Session     Activity Tolerance Patient limited by fatigue   Patient Left in bed;with call bell/phone within reach;with bed alarm set;with family/visitor present   Nurse Communication Other (comment) (check IV)        Time: [id number] OT Time Calculation (min): 16 min  Charges: OT General Charges $OT Visit: 1 Procedure OT Treatments $Therapeutic Activity: 8-22 mins  Tiffane Sheldon A 12/03/2015, 9:19 AM

## 2015-12-03 NOTE — Clinical Social Work Note (Signed)
Clinical Social Work Assessment  Patient Details  Name: PARDEEP PAUTZ MRN: [id number] Date of Birth: 1946-03-15  Date of referral:  12/03/15               Reason for consult:  Discharge Planning                Permission sought to share information with:  Chartered certified accountant granted to share information::  Yes, Verbal Permission Granted  Name::        Agency::     Relationship::     Contact Information:     Housing/Transportation Living arrangements for the past 2 months:  Single Family Home Source of Information:  Spouse Patient Interpreter Needed:    Criminal Activity/Legal Involvement Pertinent to Current Situation/Hospitalization:  No - Comment as needed Significant Relationships:  Spouse, Adult Children Lives with:  Spouse Do you feel safe going back to the place where you live?  Yes Need for family participation in patient care:  Yes (Comment)  Care giving concerns:  No care giving concerns reported.   Social Worker assessment / plan:  Pt hospitalized from home with metastatic brain lesions on 11/24/15. CSW met with pt's spouse, at bedside, to assist with d/c planning. Pt was sleeping soundly during visit. CIR has been consulted. PT/ OT continue to work with pt. Spouse has reported to CSW that once pt is able to assist with transfers to the bathroom she would like pt to return home with her. Spouse gave CSW permission to initiate SNF search in case pt isn't appropriate for CIR and needs more help than is available at home at time of d/c. Bed offers are pending. CSW will continue to follow to assist with d/c planning needs.  Employment status:  Retired Health visitor PT Recommendations:  Inpatient [place], [place] / Referral to community resources:  [place]  Patient/Family's Response to care:  Disposition to be determined.  Patient/Family's Understanding of and Emotional Response  to Diagnosis, Current Treatment, and Prognosis:  Pt sleeping during CSW visit. Spouse of 3 yrs. is overwhelmed with the change in pt. " He was so active and independent . The change happened so quickly. " Emotional support provided.  Emotional Assessment Appearance:  Appears stated age Attitude/Demeanor/Rapport:  Unable to Assess Affect (typically observed):  Unable to Assess Orientation:  Oriented to Self Alcohol / Substance use:  Not Applicable Psych involvement (Current and /or in the community):  No (Comment)  Discharge Needs  Concerns to be addressed:  Discharge Planning Concerns Readmission within the last 30 days:  No Current discharge risk:  None Barriers to Discharge:  No Barriers Identified   Luretha Rued, [place] 12/03/2015, 12:00 PM

## 2015-12-03 NOTE — Clinical Social Work Placement (Signed)
   CLINICAL SOCIAL WORK PLACEMENT  NOTE  Date:  12/03/2015  Patient Details  Name: SHARI ZEEDYK MRN: [id number] Date of Birth: 07-31-1945  Clinical Social Work is seeking post-discharge placement for this patient at the [place] level of care (*CSW will initial, date and re-position this form in  chart as items are completed):  Yes   Patient/family provided with [place] Work Department's list of facilities offering this level of care within the geographic area requested by the patient (or if unable, by the patient's family).  Yes   Patient/family informed of their freedom to choose among providers that offer the needed level of care, that participate in Medicare, Medicaid or managed care program needed by the patient, have an available bed and are willing to accept the patient.  Yes   Patient/family informed of [facility]'s ownership interest in [facility], as well as of the fact that they are under no obligation to receive care at these facilities.  PASRR submitted to EDS on 11/26/15     PASRR number received on 12/03/15     Existing PASRR number confirmed on       FL2 transmitted to all facilities in geographic area requested by pt/family on 12/03/15     FL2 transmitted to all facilities within larger geographic area on       Patient informed that his/her managed care company has contracts with or will negotiate with certain facilities, including the following:            Patient/family informed of bed offers received.  Patient chooses bed at       Physician recommends and patient chooses bed at      Patient to be transferred to   on  .  Patient to be transferred to facility by       Patient family notified on   of transfer.  Name of family member notified:        PHYSICIAN       Additional Comment:    _______________________________________________ Luretha Rued, [place] 12/03/2015,  1:10 PM

## 2015-12-04 ENCOUNTER — Ambulatory Visit
Admit: 2015-12-04 | Discharge: 2015-12-04 | Disposition: A | Discharge status: Home / Self Care | Payer: Medicare Other | Attending: Radiation Oncology | Admitting: Radiation Oncology

## 2015-12-04 DIAGNOSIS — K769 Liver disease, unspecified: Secondary | ICD-10-CM

## 2015-12-04 DIAGNOSIS — R911 Solitary pulmonary nodule: Secondary | ICD-10-CM

## 2015-12-04 DIAGNOSIS — K7689 Other specified diseases of liver: Secondary | ICD-10-CM

## 2015-12-04 LAB — GLUCOSE, CAPILLARY
GLUCOSE-CAPILLARY: 144 mg/dL — AB (ref 65–99)
GLUCOSE-CAPILLARY: 173 mg/dL — AB (ref 65–99)
GLUCOSE-CAPILLARY: 170 mg/dL — AB (ref 65–99)
Glucose-Capillary: 117 mg/dL — ABNORMAL HIGH (ref 65–99)
Glucose-Capillary: 142 mg/dL — ABNORMAL HIGH (ref 65–99)

## 2015-12-04 MED ORDER — ASPIRIN EC 81 MG PO TBEC
81.0000 mg | DELAYED_RELEASE_TABLET | Freq: Every day | ORAL | Status: DC
  Administered 2015-12-05: 81 mg via ORAL
  Filled 2015-12-04: qty 1

## 2015-12-04 MED ORDER — ENSURE ENLIVE PO LIQD
237.0000 mL | Freq: Three times a day (TID) | ORAL | Status: DC
  Administered 2015-12-04 – 2015-12-05 (×2): 237 mL via ORAL

## 2015-12-04 NOTE — Progress Notes (Signed)
Rehab admissions - I met with patient and his wife at the bedside.  Wife does not feel patient could tolerate 3 hours of therapy a day at this point.  Wife prefers [facility] in [place] for rehab.  I did give wife rehab brochures.  I will follow along for increased tolerance, but I expect that patient will need SNF level therapies.  Call me for questions.  #[phone]

## 2015-12-04 NOTE — Care Management Note (Signed)
Case Management Note  Patient Details  Name: Chad Ball MRN: [id number] Date of Birth: 1945-05-13  Subjective/Objective:      70 yo admitted with Brain Mass.              Action/Plan: Pt from home with spouse. CSW working on SNF placement for pt. CIR is not an option as pt can not tolerate 3 hours of therapy daily. CM will continue to follow and assist as needed.  Expected Discharge Date:                  Expected Discharge Plan:  Skilled Nursing Facility  In-House Referral:  Clinical Social Work  Discharge planning Services  CM Consult  Post Acute Care Choice:    Choice offered to:     DME Arranged:    DME Agency:     HH Arranged:    [employer] Agency:     Status of Service:  In process, will continue to follow  If discussed at Long Length of Stay Meetings, dates discussed:    Additional CommentsLynnell Catalan, RN 12/04/2015, 10:00 AM  [phone]

## 2015-12-04 NOTE — Care Management Important Message (Signed)
Important Message  Patient Details  Name: Chad Ball MRN: [id number] Date of Birth: [birth year]   Medicare Important Message Given:  Yes    Camillo Flaming 12/04/2015, 10:12 AMImportant Message  Patient Details  Name: Chad Ball MRN: [id number] Date of Birth: 1945/06/26   Medicare Important Message Given:  Yes    Camillo Flaming 12/04/2015, 10:11 AM

## 2015-12-04 NOTE — Progress Notes (Signed)
Chad Ball  [id number] DOB: 07-15-1945 DOA: 11/24/2015   PCP: No primary care provider on file.    Brief Narrative:  70 y.o. male with history of CAD w/ MI s/p stent, arthritis, GERD, and a diagnosis of melanoma on the chest status post resection 1 month prior who was to be followed as an outpatient who was brought to [facility] via [facility]  for further workup of suspected metastatic brain lesions.  The patient had an episode of acute confusion while visiting a relative in [place], and had been experiencing severe, constant headaches around the temporal and frontal area for about one week.   Upon admission to [facility], the patient was incoherent.  A CT of the head revealed multiple intracranial lesions. MRI brain confirmed these findings, including at least 2 separate lesions in the right cerebellum and bilateral temporal lobe lesions. CT of the chest abdomen and pelvis showed 2 low-density nodules in the liver, most likely cysts, and a CT of the chest showed a 2.4 x 1.8 cm left upper lobe nodule, with pathologic lymphadenopathy in the left hilum.  He received IV steroids with some improvement in his mental status. Because he lives in [place], he was transferred to [facility] for Neurosurgical evaluation in order to obtain the proper tissue diagnosis for treatment options.   Subjective: No concerns this AM. Feels good. No pain.   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  - suspected metastatic melanoma - cont decadron and AED prophylaxis  - CT scan chest abdomen and pelvis revealed only 1 modest sized lesion in the left upper lobe   - After further consideration neurosurgery is not felt to be appropriate/indicated  - The patient will be planned to receive a course of whole brain radiation treatment, appreciate rad oncology team assistance  - spoke with Dr. Julien Nordmann, once pt completes radiation therapy he can be set  up in oncology clinic provided he is clinically improving  FTT - nutritionist consulted. Recommendations include Ensure TID - if oral intake does not improve, consider tube feedings   CAD - s/p stent - restart aspirin 81mg  - stable presently   Leukocytosis - secondary to dexamethasone  - pt with no fevers   Hyperlipidemia - Continue home statin  GERD - Continue PPI  Goals of Care - Remains full code  DVT prophylaxis: SCDs Code Status: FULL CODE Family Communication: wife at bedside this PM Disposition Plan: Once radiation   Consultants:  Nutritionist  Inpatient rehab MD Radiation Oncology Oncologist over the phone Dr. Julien Nordmann   Procedures: None   Antimicrobials:  None    Objective: Blood pressure 121/85, pulse 64, temperature 98.4 F (36.9 C), temperature source Oral, resp. rate 18, height 6\' 1"  (1.854 m), weight 81.6 kg (180 lb), SpO2 97 %.  Intake/Output Summary (Last 24 hours) at 12/04/15 2040 Last data filed at 12/04/15 1907  Gross per 24 hour  Intake              480 ml  Output             1000 ml  Net             -520 ml   Filed Weights   11/24/15 1622 11/26/15 1153  Weight: 76 kg (167 lb 8.8 oz) 81.6 kg (180 lb)    Examination: General: No acute respiratory distress - alert and oriented to name and place. Sitting up in bed with  help of physical therapy Lungs: Clear to auscultation bilaterally without wheezes or crackles Cardiovascular: Regular rate and rhythm  Abdomen: Nondistended, soft, bowel sounds positive, no rebound, no ascites, no appreciable mass Extremities: No significant cyanosis, clubbing, edema bilateral lower extremities  CBC:  Recent Labs Lab 11/29/15 0451 12/02/15 0339  WBC 19.9* 22.2*  NEUTROABS 18.2*  --   HGB 15.1 14.9  HCT 45.6 42.4  MCV 91.4 89.3  PLT 201 99991111   Basic Metabolic Panel:  Recent Labs Lab 11/29/15 0451 12/01/15 0415 12/02/15 0339  NA 135 139 135  K 4.1 4.3 4.1  CL 102 107 102  CO2 24 25 26    GLUCOSE 133* 136* 135*  BUN 26* 31* 34*  CREATININE 0.83 0.84 0.87  CALCIUM 8.6* 8.6* 8.3*  MG 2.2  --   --    Liver Function Tests:  Recent Labs Lab 11/29/15 0451  AST 28  ALT 25  ALKPHOS 73  BILITOT 1.7*  PROT 5.8*  ALBUMIN 3.0*    Coagulation Profile:  Recent Labs Lab 11/29/15 0451  INR 0.99   HbA1C: Hgb A1c MFr Bld  Date/Time Value Ref Range Status  11/24/2015 05:30 AM 5.4 4.8 - 5.6 % Final    Comment:    (NOTE)         Pre-diabetes: 5.7 - 6.4         Diabetes: >6.4         Glycemic control for adults with diabetes: <7.0     CBG:  Recent Labs Lab 12/03/15 1639 12/03/15 2322 12/04/15 0823 12/04/15 1221 12/04/15 1757  GLUCAP 139* 117* 144* 173* 142*    Recent Results (from the past 240 hour(s))  Culture, blood (routine x 2)     Status: None   Collection Time: 11/26/15  5:18 PM  Result Value Ref Range Status   Specimen Description BLOOD LEFT ANTECUBITAL  Final   Special Requests IN PEDIATRIC BOTTLE 2CC  Final   Culture NO GROWTH 5 DAYS  Final   Report Status 12/01/2015 FINAL  Final  Culture, blood (routine x 2)     Status: None   Collection Time: 11/26/15  5:20 PM  Result Value Ref Range Status   Specimen Description BLOOD RIGHT HAND  Final   Special Requests BOTTLES DRAWN AEROBIC AND ANAEROBIC 5CC  Final   Culture NO GROWTH 5 DAYS  Final   Report Status 12/01/2015 FINAL  Final     Scheduled Meds: . atorvastatin  80 mg Oral Daily  . carvedilol  3.125 mg Oral BID WC  . dexamethasone  4 mg Intravenous Q6H  . feeding supplement (ENSURE ENLIVE)  237 mL Oral TID BM  . insulin aspart  0-5 Units Subcutaneous QHS  . insulin aspart  0-9 Units Subcutaneous TID WC  . levETIRAcetam  750 mg Oral BID  . multivitamin with minerals  1 tablet Oral Daily  . pantoprazole  40 mg Oral Q1200  . sodium chloride flush  3 mL Intravenous Q12H     LOS: 10 days   Cordelia Poche, MD [facility] 12/04/2015, 8:40 PM  If 7PM-7AM, please contact  night-coverage [email or web address] Password [email or web address]

## 2015-12-04 NOTE — Progress Notes (Signed)
Physical Therapy Treatment Patient Details Name: Chad Ball" MRN: [id number] DOB: 1945/06/11 Today's Date: 12/04/2015    History of Present Illness 70 y.o. male with medical history significant for CAD s/p MI s/p stent , arthritis, GERD, recent diagnosis of melanoma 1 month ago ehich was to be followed as an outpatient, brought  via [facility]  for furtherworkup of metastatic brain lesions.CT of the head was performed, revealing multiple intracranial lesions, in the posterior foci. MRI brain confirmed these findings, including  at least 2 separate lesions in the right cerebellum and bilateral temporal lobe lesions. CT of the chest abdomen and pelvis showed 2 low-density nodules in the liver, most likely cysts, and a CT of the chest showed a 2.4 x 1.8 cm left upper lobe nodule, with pathologic lymphadenopathy in the left hilum.  Plan is to proceed with radiation therapy.     PT Comments    Assisted pt OOB to amb.  Pt required MAX encouragement and positive reinforcement as he repeated "I can't do this", "let me lie down".  With + 2 assist for safety, pt was able to amb 38 feet.  Very weak.  Very unsteady gait.  Limited activity tolerance.  Also assisted on/off BSC for a BM then back to bed for Radiation.  Completely wore out.   Pt will need ST Rehab at SNF prior to returning home.  Very supportive family.  Follow Up Recommendations  SNF     Equipment Recommendations       Recommendations for Other Services       Precautions / Restrictions Precautions Precautions: Fall Precaution Comments: generally weak Restrictions Weight Bearing Restrictions: No    Mobility  Bed Mobility Overal bed mobility: Needs Assistance Bed Mobility: Supine to Sit;Sit to Supine     Supine to sit: Min assist Sit to supine: Min assist   General bed mobility comments: required MAX encouragement and repeat VC's to stay on task as he repeated "I can't do this", "let me lie down".  Pt able to self rise  but fatigues easily.  Transfers Overall transfer level: Needs assistance Equipment used: 2 person hand held assist Transfers: Sit to/from [facility] Sit to Stand: Min assist         General transfer comment: required MAX encouragement and repeat functional one step commands.  Assisted OOB and on/off BSC.  at one point, pt was impulsive and stood from [facility] to get to recliner because his "back was tired".    Ambulation/Gait Ambulation/Gait assistance: Min assist;Mod assist;+2 physical assistance;+2 safety/equipment Ambulation Distance (Feet): 38 Feet Assistive device: Bilateral platform walker Gait Pattern/deviations: Step-to pattern;Step-through pattern;Decreased step length - right;Decreased step length - left;Narrow base of support Gait velocity: decreased    General Gait Details: used B platform EVA walker for increased support and to increase amb distance.  3rd assist followed with recliner.  Very unsteady gait.  Weak.     Stairs            Wheelchair Mobility    Modified Rankin (Stroke Patients Only)       Balance                                    Cognition Arousal/Alertness: Awake/alert;Lethargic Behavior During Therapy: Flat affect Overall Cognitive Status: Impaired/Different from baseline Area of Impairment: Orientation;Attention;Memory;Following commands;Safety/judgement;Awareness;Problem solving Orientation Level: Disoriented to;Place;Time;Situation Current Attention Level: Focused Memory: Decreased short-term memory Following Commands: Follows  one step commands with increased time Safety/Judgement: Decreased awareness of deficits Awareness: Intellectual Problem Solving: Difficulty sequencing;Requires verbal cues;Requires tactile cues;Decreased initiation General Comments: Kept saying, "I can't do this" despite doing well with physical tasks.    Exercises      General Comments        Pertinent Vitals/Pain       Home Living                      Prior Function            PT Goals (current goals can now be found in the care plan section) Progress towards PT goals: Progressing toward goals    Frequency  Min 3X/week    PT Plan Current plan remains appropriate    Co-evaluation             End of Session Equipment Utilized During Treatment: Gait belt Activity Tolerance: Patient limited by fatigue Patient left: in bed;with bed alarm set     Time: 1436-1510 PT Time Calculation (min) (ACUTE ONLY): 34 min  Charges:  $Gait Training: 8-22 mins $Therapeutic Activity: 8-22 mins                    G Codes:      Rica Koyanagi  PTA [facility]  Acute  Rehab Pager      [phone]

## 2015-12-04 NOTE — Progress Notes (Addendum)
Nursing Note: System down and meds were given on downtime sheets at 2350.All meds due were given at that time.Decadron,keppra and oxycodone were given at that time per orders.wbb

## 2015-12-04 NOTE — Progress Notes (Signed)
Initial Nutrition Assessment  DOCUMENTATION CODES:   Severe malnutrition in context of acute illness/injury  INTERVENTION:   Provide Ensure Enlive po TID, each supplement provides 350 kcal and 20 grams of protein Encourage PO intake RD to continue to monitor  NUTRITION DIAGNOSIS:   Malnutrition related to acute illness as evidenced by severe depletion of body fat, moderate depletions of muscle mass.  GOAL:   Patient will meet greater than or equal to 90% of their needs  MONITOR:   PO intake, Supplement acceptance, Labs, Weight trends, I & O's  REASON FOR ASSESSMENT:   Consult Assessment of nutrition requirement/status  ASSESSMENT:   70 y.o. male with history of CAD w/ MI s/p stent, arthritis, GERD, and a diagnosis of melanoma on the chest status post resection 1 month prior who was to be followed as an outpatient who was brought to [facility] via [facility]  for further workup of suspected metastatic brain lesions.  The patient had an episode of acute confusion while visiting a relative in [place], and had been experiencing severe, constant headaches around the temporal and frontal area for about one week.   Patient in room with wife and family at bedside. Pt's wife reports most of history. Pt's wife reports pt's appetite is improved. He is eating 75% of his meals at this time, mainly sandwiches and soups. His appetite started improving yesterday when he would consume 20-75% of meals. Prior to yesterday pt did not eat anything for 10-12 days in a row. Pt denies swallowing, chewing or taste changes at this time.   Per MD note, pt was in consideration for tube feeds if his PO did not improve. Will continue to monitor PO and any needs for tube feeding recommendations. Pt is agreeable to trying vanilla Ensure supplements.   Per weight history, pt has lost 10 lb since 4/25 (5% wt loss x 3 months, insignificant for time frame). When told of pt's current weight documented (180 lb), pt's wife  felt that his weight should have been lower.   Nutrition-Focused physical exam completed. Findings are severe fat depletion, moderate muscle depletion, and no edema.   Medications: Multivitamin with minerals daily, D5 -.9% NaCl infusion @ 30 ml/hr -provides 122 kcal Labs reviewed: CBGs: 117-173  Diet Order:  Diet regular Room service appropriate? Yes; Fluid consistency: Thin  Skin:  Reviewed, no issues  Last BM:  8/6  Height:   Ht Readings from Last 1 Encounters:  11/30/15 6\' 1"  (1.854 m)    Weight:   Wt Readings from Last 1 Encounters:  11/26/15 180 lb (81.6 kg)    Ideal Body Weight:  83.6 kg  BMI:  Body mass index is 23.75 kg/m.  Estimated Nutritional Needs:   Kcal:  [id number]  Protein:  115-125g  Fluid:  2.3L/day  EDUCATION NEEDS:   Education needs addressed  Clayton Bibles, MS, RD, LDN Pager: [phone] After Hours Pager: [phone]

## 2015-12-05 ENCOUNTER — Ambulatory Visit
Admit: 2015-12-05 | Discharge: 2015-12-05 | Disposition: A | Discharge status: Home / Self Care | Payer: Medicare Other | Attending: Radiation Oncology | Admitting: Radiation Oncology

## 2015-12-05 LAB — GLUCOSE, CAPILLARY
Glucose-Capillary: 129 mg/dL — ABNORMAL HIGH (ref 65–99)
Glucose-Capillary: 150 mg/dL — ABNORMAL HIGH (ref 65–99)

## 2015-12-05 MED ORDER — LEVETIRACETAM 750 MG PO TABS: 750.0000 mg | ORAL_TABLET | Freq: Two times a day (BID) | ORAL | Status: AC

## 2015-12-05 MED ORDER — ENSURE ENLIVE PO LIQD: 237.0000 mL | Freq: Three times a day (TID) | ORAL | Status: AC

## 2015-12-05 MED ORDER — OXYCODONE HCL 5 MG PO TABS: 5.0000 mg | ORAL_TABLET | ORAL | 0 refills | Status: DC | PRN

## 2015-12-05 MED ORDER — CARVEDILOL 3.125 MG PO TABS: 3.1250 mg | ORAL_TABLET | Freq: Two times a day (BID) | ORAL | Status: DC

## 2015-12-05 NOTE — Discharge Summary (Signed)
Physician Discharge Summary  GRAM CAPLE [id number] DOB: October 16, 1945 DOA: 11/24/2015  PCP: No primary care provider on file.  Admit date: 11/24/2015 Discharge date: 12/05/2015  Admitted From: Home Disposition:  SNF  Recommendations for Outpatient Follow-up:  1. Follow up with PCP in 1-2 weeks 2. Recommend tapering steroid dose 3. Patient receiving brain radiation until next week  Home Health:No Equipment/Devices:None  Discharge Condition:Stable CODE STATUS:Full code Diet recommendation: Heart Healthy  Brief/Interim Summary: 70 y.o.malewith history of CAD w/ MI s/p stent, arthritis, GERD, and a diagnosis of melanoma on the chest status post resection 1 month prior who was to be followed as an outpatient who was brought to [facility] via [facility] for further workup of suspected metastatic brain lesions.  The patient had an episode of acute confusion while visiting a relative in [place], and had been experiencing severe, constant headaches around the temporal and frontal area for about one week.   Upon admission to [facility], the patient was incoherent.  A CT of the head revealed multiple intracranial lesions. MRI brain confirmed these findings, including at least 2 separate lesions in the right cerebellum and bilateral temporal lobe lesions. CT of the chest abdomen and pelvis showed 2 low-density nodules in the liver, most likely cysts, and a CT of the chest showed a 2.4 x 1.8 cm left upper lobe nodule, with pathologic lymphadenopathy in the left hilum.  He received IV steroids with some improvement in his mental status. Because he lives in [place], he was transferred to [facility] for Neurosurgical evaluation in order to obtain the proper tissue diagnosis for treatment options.   Initially, surgery was pursued, however was decided that radiation plus or minus medical oncology management would better suit patient's needs as surgery would be high risk. Patient was  eventually transferred to [facility] for continued management and started radiation on 12/05/2015. While admitted patient work with physical therapy who felt like patient would benefit most from an SNF admission. Nutrition was consult for failure to thrive and recommended nutrition supplements.  Discharge Diagnoses:  Active Problems:   Essential hypertension   Hyperlipidemia   CAD in native artery   Brain mass   Melanoma of skin ([place])   Brain metastasis ([place])   Brain tumor ([place])   Metastatic melanoma ([place])   Lung nodule   Liver lesion    Discharge Instructions  Discharge Instructions    Diet - low sodium heart healthy    Complete by:  As directed   Increase activity slowly    Complete by:  As directed       Medication List    STOP taking these medications   metoprolol tartrate 25 MG tablet Commonly known as:  LOPRESSOR     TAKE these medications   acetaminophen 325 MG tablet Commonly known as:  TYLENOL Take 650 mg by mouth every 6 (six) hours as needed for moderate pain or headache.   aspirin 81 MG tablet Take 81 mg by mouth daily.   atorvastatin 80 MG tablet Commonly known as:  LIPITOR Take 80 mg by mouth daily at 6 PM.   carvedilol 3.125 MG tablet Commonly known as:  COREG Take 1 tablet (3.125 mg total) by mouth 2 (two) times daily with a meal.   dexamethasone 4 MG tablet Commonly known as:  DECADRON Take 4 mg by mouth 4 (four) times daily.   feeding supplement (ENSURE ENLIVE) Liqd Take 237 mLs by mouth 3 (three) times daily between meals.   levETIRAcetam  750 MG tablet Commonly known as:  KEPPRA Take 1 tablet (750 mg total) by mouth 2 (two) times daily. What changed:  medication strength  how much to take   losartan 25 MG tablet Commonly known as:  COZAAR Take 12.5 mg by mouth daily.   multivitamin with minerals Tabs tablet Take 1 tablet by mouth daily.   nitroGLYCERIN 0.4 MG SL tablet Commonly known as:  NITROSTAT Place 0.4 mg under the  tongue every 5 (five) minutes as needed for chest pain.   omeprazole 20 MG capsule Commonly known as:  PRILOSEC Take 20 mg by mouth daily.   oxyCODONE 5 MG immediate release tablet Commonly known as:  Oxy IR/ROXICODONE Take 1-2 tablets (5-10 mg total) by mouth every 4 (four) hours as needed for moderate pain.       Allergies  Allergen Reactions  . Other Anaphylaxis, Shortness Of Breath and Swelling    [place] nuts    Consultations:  Neurosurgery  Critical care  Physical therapy  Occupational therapy   Procedures/Studies: Ct Chest W Contrast  Result Date: 11/27/2015 CLINICAL DATA:  Metastatic melanoma. EXAM: CT CHEST, ABDOMEN, AND PELVIS WITH CONTRAST TECHNIQUE: Multidetector CT imaging of the chest, abdomen and pelvis was performed following the standard protocol during bolus administration of intravenous contrast. CONTRAST:  118mL ISOVUE-300 IOPAMIDOL (ISOVUE-300) INJECTION 61% COMPARISON:  None. FINDINGS: CT CHEST FINDINGS Mediastinum/Lymph Nodes: The heart size appears normal. No pericardial effusion. Aortic atherosclerosis noted. LAD coronary artery calcification is identified. The trachea appears patent and is midline. Unremarkable appearance of the esophagus. No enlarged mediastinal, axillary or supraclavicular lymph nodes. Lungs/Pleura: No pleural effusion identified. Pulmonary nodule in the left upper lobe measures 2.4 cm, image 51 of series 4. Scarring noted within both lower lobes. Musculoskeletal: No chest wall mass or suspicious bone lesions identified. CT ABDOMEN PELVIS FINDINGS Hepatobiliary: There are 2 low-attenuation foci within the liver. The largest measure 9 mm. These are too small to reliably characterize. The gallbladder appears normal. No biliary dilatation. Pancreas: No mass, inflammatory changes, or other significant abnormality. Spleen: Within normal limits in size and appearance. Adrenals/Urinary Tract: Normal appearance of the adrenal glands. Striated  nephrographic appearance of the right kidney is identified, image number 18 of series 8. The urinary bladder appears normal. Stomach/Bowel: Small hiatal hernia. There is no pathologic dilatation of the small bowel loops. Numerous colonic diverticula are identified. No acute inflammation. Vascular/Lymphatic: Calcified atherosclerotic disease involves the abdominal aorta. No aneurysm. No enlarged upper abdominal lymph nodes identified. No pelvic or inguinal adenopathy. Reproductive: Prostate gland enlargement noted. Other: There is no ascites or focal fluid collections within the abdomen or pelvis. Musculoskeletal:  No aggressive lytic or sclerotic bone lesions. IMPRESSION: 1. Pulmonary nodule within the left upper lobe is identified and suspicious for metastases. 2. No specific findings identified to suggest metastatic disease to the abdomen or pelvis. 3. Striated nephrographic appearance of the right kidney. This is a nonspecific finding and may be seen with pyelonephritis as well as vascular abnormality such as embolic phenomenon. Clinical correlation suggested. Electronically Signed   By: Kerby Moors M.D.   On: 11/27/2015 17:46   Mr Chad Ball [id number] Contrast  Result Date: 11/26/2015 CLINICAL DATA:  70 year old male with speech difficulty and imbalance. Symptoms started 1 week ago complaining of headache. History melanoma removed 1 month ago. Abnormal imaging at [facility] in [place]. Initial encounter. EXAM: MRI HEAD WITHOUT AND WITH CONTRAST TECHNIQUE: Multiplanar, multiecho pulse sequences of the brain and surrounding structures were obtained without  and with intravenous contrast. CONTRAST:  18mL MULTIHANCE GADOBENATE DIMEGLUMINE 529 MG/ML IV SOLN COMPARISON:  No comparison exams available. FINDINGS: Exam was performed under general anesthesia. Numerous intracranial masses within the supratentorial and infratentorial region numbering approximately 25 containing blood breakdown products or melanin  suggestive of metastatic melanoma given patient's history. Some of the larger lesions include: Right anterior frontal 4.8 x 4.9 x 5 cm mass with marked surrounding vasogenic edema. Cannot exclude extension into the superior right orbital region as bone of the orbital roof may be destroyed. This causes significant mass effect upon the right frontal horn of the lateral ventricular system which is displaced posteriorly and to the right. Left temporal lobe 4.8 x 3.2 x 3.1 cm mass with marked surrounding vasogenic edema and flattening of the left temporal horn. Superior right cerebellar 1.8 cm and 1.4 cm mass with surrounding vasogenic edema and mild compression the right lateral aspect of the fourth ventricle. Blood or proteinaceous material layering within the deep dependent aspect of the lateral ventricles suggesting breakthrough of hemorrhagic metastatic lesion into the ventricular system. Primary intracranial hemorrhage is a secondary less likely consideration. Abnormal appearance of the sulci on FLAIR imaging may be related to oxygen saturation secondary to the fact this was performed under general anesthesia limiting detection of the possibility of subarachnoid hemorrhage. In addition to significant distortion of the lateral ventricle by metastatic disease, mild hydrocephalus may be present. Major intracranial vascular structures are patent. No acute thrombotic infarct. IMPRESSION: Numerous intracranial masses within the supratentorial and infratentorial region numbering approximately 25 containing blood breakdown products or melanin suggestive of metastatic melanoma given patient's history. Some of the larger lesions include: Right anterior frontal 4.8 x 4.9 x 5 cm mass with marked surrounding vasogenic edema. Cannot exclude extension into the superior right orbital region as bone of the orbital roof may be destroyed. This causes significant mass effect upon the right frontal horn of the lateral ventricular  system which is displaced posteriorly and to the right. Left temporal lobe 4.8 x 3.2 x 3.1 cm mass with marked surrounding vasogenic edema and flattening of the left temporal horn. Superior right cerebellar 1.8 cm and 1.4 cm mass with surrounding vasogenic edema and mild compression the right lateral aspect of the fourth ventricle. Blood or proteinaceous material layering within the deep dependent aspect of the lateral ventricles suggesting breakthrough of hemorrhagic metastatic lesion into the ventricular system. Primary intracranial hemorrhage is a secondary less likely consideration. Abnormal appearance of the sulci on FLAIR imaging may be related to oxygen saturation secondary to the fact this was performed under general anesthesia limiting detection of the possibility of subarachnoid hemorrhage. In addition to significant distortion of the lateral ventricle by metastatic disease, mild hydrocephalus may be present. These results will be called to the ordering clinician or representative by the Radiologist Assistant, and communication documented in the PACS or zVision Dashboard. Electronically Signed   By: Genia Del M.D.   On: 11/26/2015 15:32   Ct Abdomen Pelvis W Contrast  Result Date: 11/27/2015 CLINICAL DATA:  Metastatic melanoma. EXAM: CT CHEST, ABDOMEN, AND PELVIS WITH CONTRAST TECHNIQUE: Multidetector CT imaging of the chest, abdomen and pelvis was performed following the standard protocol during bolus administration of intravenous contrast. CONTRAST:  186mL ISOVUE-300 IOPAMIDOL (ISOVUE-300) INJECTION 61% COMPARISON:  None. FINDINGS: CT CHEST FINDINGS Mediastinum/Lymph Nodes: The heart size appears normal. No pericardial effusion. Aortic atherosclerosis noted. LAD coronary artery calcification is identified. The trachea appears patent and is midline. Unremarkable appearance of the esophagus.  No enlarged mediastinal, axillary or supraclavicular lymph nodes. Lungs/Pleura: No pleural effusion  identified. Pulmonary nodule in the left upper lobe measures 2.4 cm, image 51 of series 4. Scarring noted within both lower lobes. Musculoskeletal: No chest wall mass or suspicious bone lesions identified. CT ABDOMEN PELVIS FINDINGS Hepatobiliary: There are 2 low-attenuation foci within the liver. The largest measure 9 mm. These are too small to reliably characterize. The gallbladder appears normal. No biliary dilatation. Pancreas: No mass, inflammatory changes, or other significant abnormality. Spleen: Within normal limits in size and appearance. Adrenals/Urinary Tract: Normal appearance of the adrenal glands. Striated nephrographic appearance of the right kidney is identified, image number 18 of series 8. The urinary bladder appears normal. Stomach/Bowel: Small hiatal hernia. There is no pathologic dilatation of the small bowel loops. Numerous colonic diverticula are identified. No acute inflammation. Vascular/Lymphatic: Calcified atherosclerotic disease involves the abdominal aorta. No aneurysm. No enlarged upper abdominal lymph nodes identified. No pelvic or inguinal adenopathy. Reproductive: Prostate gland enlargement noted. Other: There is no ascites or focal fluid collections within the abdomen or pelvis. Musculoskeletal:  No aggressive lytic or sclerotic bone lesions. IMPRESSION: 1. Pulmonary nodule within the left upper lobe is identified and suspicious for metastases. 2. No specific findings identified to suggest metastatic disease to the abdomen or pelvis. 3. Striated nephrographic appearance of the right kidney. This is a nonspecific finding and may be seen with pyelonephritis as well as vascular abnormality such as embolic phenomenon. Clinical correlation suggested. Electronically Signed   By: Kerby Moors M.D.   On: 11/27/2015 17:46   Mr Liver W [id number] Contrast  Result Date: 12/03/2015 CLINICAL DATA:  Metastatic melanoma.  Evaluate liver lesion. EXAM: MRI ABDOMEN WITHOUT AND WITH CONTRAST TECHNIQUE:  Multiplanar multisequence MR imaging of the abdomen was performed both before and after the administration of intravenous contrast. CONTRAST:  16 cc MultiHance COMPARISON:  CT 11/27/2015 FINDINGS: Moderate motion degradation throughout. The initial series, including the TT respiratory triggered axials, are of moderate to good quality. The later series, including the pre and postcontrast dynamic, are severely motion degraded and essentially nondiagnostic. Lower chest: Normal heart size without pericardial or pleural effusion. Incidental note is made of ascending aortic dilatation, including at 4.5 cm on image 2/series 1802. Hepatobiliary: The 2 left hepatic lobe lesions are markedly T2 hyperintense on image 16/ series 3 and demonstrate no significant post-contrast enhancement. Most consistent with simple cysts. Larger is in the lateral segment left liver lobe at 8 mm. No suspicious liver lesion. Normal gallbladder, without biliary ductal dilatation. Pancreas: Normal, without mass or ductal dilatation. Spleen: Normal in size, without focal abnormality. Adrenals/Urinary Tract: Normal adrenal glands. Tiny bilateral renal lesions which are likely cysts. Heterogeneous T2 signal within the interpolar right kidney, including wedge-shaped area of T2 hypo intensity on image 28/ series 3. Concurrent hypo enhancement. No hydronephrosis. Stomach/Bowel: Small hiatal hernia. Otherwise normal stomach and abdominal bowel loops. Vascular/Lymphatic: Normal caliber of the aorta and branch vessels. Retroaortic left renal vein. Right renal vein grossly patent. No retroperitoneal or retrocrural adenopathy. Other: No ascites. Musculoskeletal: No acute osseous abnormality. IMPRESSION: 1. Moderate motion degradation. 2. The hepatic lesions are consistent with cysts. No evidence of hepatic metastasis. 3. Right renal abnormality is favored to represent pyelonephritis. Correlate with urinalysis. 4. Ascending aortic aneurysm, on the order of 4.5  cm. Recommend semi-annual imaging followup by CTA or MRA and referral to cardiothoracic surgery if not already obtained. This recommendation follows 2010 ACCF/AHA/AATS/ACR/ASA/SCA/SCAI/SIR/STS/SVM Guidelines for the Diagnosis and Management of Patients  With Thoracic Aortic Disease. Circulation. 2010; [id number] Electronically Signed   By: Abigail Miyamoto M.D.   On: 12/03/2015 08:25   (Echo, Carotid, EGD, Colonoscopy, ERCP)    Subjective:   Discharge Exam: Vitals:   12/04/15 2045 12/05/15 0525  BP: 137/82 133/86  Pulse: 83 (!) 58  Resp: 16 16  Temp: 98.3 F (36.8 C) 98.3 F (36.8 C)   Vitals:   12/04/15 0601 12/04/15 1430 12/04/15 2045 12/05/15 0525  BP: 130/85 121/85 137/82 133/86  Pulse: 62 64 83 (!) 58  Resp: 18 18 16 16   Temp: 98 F (36.7 C) 98.4 F (36.9 C) 98.3 F (36.8 C) 98.3 F (36.8 C)  TempSrc: Oral Oral Oral Oral  SpO2: 97% 97% 97% 97%  Weight:      Height:        General: Pt is alert, awake, not in acute distress Cardiovascular: RRR, S1/S2 +, no rubs, no gallops Respiratory: CTA bilaterally, no wheezing, no rhonchi Abdominal: Soft, NT, ND, bowel sounds + Extremities: no edema, no cyanosis    The results of significant diagnostics from this hospitalization (including imaging, microbiology, ancillary and laboratory) are listed below for reference.     Microbiology: Recent Results (from the past 240 hour(s))  Culture, blood (routine x 2)     Status: None   Collection Time: 11/26/15  5:18 PM  Result Value Ref Range Status   Specimen Description BLOOD LEFT ANTECUBITAL  Final   Special Requests IN PEDIATRIC BOTTLE 2CC  Final   Culture NO GROWTH 5 DAYS  Final   Report Status 12/01/2015 FINAL  Final  Culture, blood (routine x 2)     Status: None   Collection Time: 11/26/15  5:20 PM  Result Value Ref Range Status   Specimen Description BLOOD RIGHT HAND  Final   Special Requests BOTTLES DRAWN AEROBIC AND ANAEROBIC 5CC  Final   Culture NO GROWTH 5 DAYS   Final   Report Status 12/01/2015 FINAL  Final     Labs: BNP (last 3 results) No results for input(s): BNP in the last 8760 hours. Basic Metabolic Panel:  Recent Labs Lab 11/29/15 0451 12/01/15 0415 12/02/15 0339  NA 135 139 135  K 4.1 4.3 4.1  CL 102 107 102  CO2 24 25 26   GLUCOSE 133* 136* 135*  BUN 26* 31* 34*  CREATININE 0.83 0.84 0.87  CALCIUM 8.6* 8.6* 8.3*  MG 2.2  --   --    Liver Function Tests:  Recent Labs Lab 11/29/15 0451  AST 28  ALT 25  ALKPHOS 73  BILITOT 1.7*  PROT 5.8*  ALBUMIN 3.0*   No results for input(s): LIPASE, AMYLASE in the last 168 hours. No results for input(s): AMMONIA in the last 168 hours. CBC:  Recent Labs Lab 11/29/15 0451 12/02/15 0339  WBC 19.9* 22.2*  NEUTROABS 18.2*  --   HGB 15.1 14.9  HCT 45.6 42.4  MCV 91.4 89.3  PLT 201 186   Cardiac Enzymes: No results for input(s): CKTOTAL, CKMB, CKMBINDEX, TROPONINI in the last 168 hours. BNP: Invalid input(s): POCBNP CBG:  Recent Labs Lab 12/04/15 1221 12/04/15 1757 12/04/15 2153 12/05/15 0750 12/05/15 1142  GLUCAP 173* 142* 170* 129* 150*   D-Dimer No results for input(s): DDIMER in the last 72 hours. Hgb A1c No results for input(s): HGBA1C in the last 72 hours. Lipid Profile No results for input(s): CHOL, HDL, LDLCALC, TRIG, CHOLHDL, LDLDIRECT in the last 72 hours. Thyroid function studies No results for  input(s): TSH, T4TOTAL, T3FREE, THYROIDAB in the last 72 hours.  Invalid input(s): FREET3 Anemia work up No results for input(s): VITAMINB12, FOLATE, FERRITIN, TIBC, IRON, RETICCTPCT in the last 72 hours. Urinalysis    Component Value Date/Time   BILIRUBINUR large (A) 09/02/2015 1054   KETONESUR small (15) (A) 09/02/2015 1054   PROTEINUR =100 (A) 09/02/2015 1054   UROBILINOGEN 1.0 09/02/2015 1054   NITRITE Positive (A) 09/02/2015 1054   LEUKOCYTESUR Trace (A) 09/02/2015 1054   Sepsis Labs Invalid input(s): PROCALCITONIN,  WBC,   LACTICIDVEN Microbiology Recent Results (from the past 240 hour(s))  Culture, blood (routine x 2)     Status: None   Collection Time: 11/26/15  5:18 PM  Result Value Ref Range Status   Specimen Description BLOOD LEFT ANTECUBITAL  Final   Special Requests IN PEDIATRIC BOTTLE 2CC  Final   Culture NO GROWTH 5 DAYS  Final   Report Status 12/01/2015 FINAL  Final  Culture, blood (routine x 2)     Status: None   Collection Time: 11/26/15  5:20 PM  Result Value Ref Range Status   Specimen Description BLOOD RIGHT HAND  Final   Special Requests BOTTLES DRAWN AEROBIC AND ANAEROBIC 5CC  Final   Culture NO GROWTH 5 DAYS  Final   Report Status 12/01/2015 FINAL  Final     Time coordinating discharge: Over 30 minutes  SIGNED:   Cordelia Poche, MD  [facility] 12/05/2015, 12:23 PM Pager   If 7PM-7AM, please contact night-coverage [email or web address] Password [email or web address]

## 2015-12-05 NOTE — Progress Notes (Signed)
Report called to Caryl Pina, RN at [facility].

## 2015-12-05 NOTE — Progress Notes (Signed)
Physical Therapy Treatment Patient Details Name: Chad Ball MRN: [id number] DOB: Nov 11, 1945 Today's Date: 12/05/2015    History of Present Illness 70 y.o. male with medical history significant for CAD s/p MI s/p stent , arthritis, GERD, recent diagnosis of melanoma 1 month ago ehich was to be followed as an outpatient, brought  via [facility]  for furtherworkup of metastatic brain lesions.CT of the head was performed, revealing multiple intracranial lesions, in the posterior foci. MRI brain confirmed these findings, including  at least 2 separate lesions in the right cerebellum and bilateral temporal lobe lesions. CT of the chest abdomen and pelvis showed 2 low-density nodules in the liver, most likely cysts, and a CT of the chest showed a 2.4 x 1.8 cm left upper lobe nodule, with pathologic lymphadenopathy in the left hilum.  Plan is to proceed with radiation therapy.     PT Comments    Pt appears "withdrawn" and requires MAX encouragement to participate.  Pt repeats "I can't" and "I don't care".  Assisted OOB with much positive encouragement to amb to bathroom for a BM.  Very unsteady gait. + 2 assist to amb with RW.  Assisted with hygiene due to poor balance.  Used EVA B platform walker to amb twice in hallway + 2 assist with spouse following with recliner.  Tolerated increased distance.  Positioned in recliner to comfort and encouraged to stay OOB.    Follow Up Recommendations  SNF     Equipment Recommendations       Recommendations for Other Services       Precautions / Restrictions Precautions Precautions: Fall Precaution Comments: generally weak Restrictions Weight Bearing Restrictions: No    Mobility  Bed Mobility Overal bed mobility: Needs Assistance Bed Mobility: Supine to Sit     Supine to sit: Min assist;Mod assist     General bed mobility comments: required MAX encouragement and repeat VC's to stay on task as he repeated "I can't do this", "let me lie down".  Pt  able to self rise but fatigues easily.  Transfers Overall transfer level: Needs assistance Equipment used: 2 person hand held assist   Sit to Stand: Min assist;Mod assist         General transfer comment: VC's to increase self assist and motivate.  Assisted off bed to amb to bathroom, then assist on/off toilet with extra assist to control decent and power up from lower level.    Ambulation/Gait Ambulation/Gait assistance: Mod assist;+2 physical assistance Ambulation Distance (Feet): 90 Feet (45 feet x 2 one sitting rest break ) Assistive device: Bilateral platform walker (EVA walker)   Gait velocity: decreased    General Gait Details: used B platform EVA walker for increased support and to increase amb distance.  3rd assist followed with recliner.  Very unsteady gait.  Weak.  Tolerated increased distance.  attempted amb with RW but too unsteady.     Stairs            Wheelchair Mobility    Modified Rankin (Stroke Patients Only)       Balance                                    Cognition                            Exercises      General Comments  Pertinent Vitals/Pain Pain Assessment: No/denies pain    Home Living                      Prior Function            PT Goals (current goals can now be found in the care plan section) Progress towards PT goals: Progressing toward goals    Frequency  Min 3X/week    PT Plan Current plan remains appropriate    Co-evaluation             End of Session Equipment Utilized During Treatment: Gait belt Activity Tolerance: Patient limited by fatigue Patient left: in chair;with chair alarm set;with family/visitor present;with call bell/phone within reach     Time: [id number] PT Time Calculation (min) (ACUTE ONLY): 26 min  Charges:  $Gait Training: 8-22 mins $Therapeutic Activity: 8-22 mins                    G Codes:      Rica Koyanagi  PTA [facility]  Acute   Rehab Pager      [phone]

## 2015-12-05 NOTE — Progress Notes (Signed)
Pt discharged to [facility] in stable condition.  Report called to facility

## 2015-12-06 ENCOUNTER — Inpatient Hospital Stay (HOSPITAL_COMMUNITY)
Admission: EM | Admit: 2015-12-06 | Discharge: 2015-12-13 | DRG: 871 | Disposition: A | Discharge status: Skilled Nursing Facility | Payer: Medicare Other | Attending: Internal Medicine | Admitting: Internal Medicine

## 2015-12-06 ENCOUNTER — Ambulatory Visit: Payer: Medicare Other

## 2015-12-06 ENCOUNTER — Telehealth: Payer: Self-pay | Admitting: *Deleted

## 2015-12-06 ENCOUNTER — Emergency Department (HOSPITAL_COMMUNITY): Payer: Medicare Other

## 2015-12-06 ENCOUNTER — Encounter (HOSPITAL_COMMUNITY): Payer: Self-pay

## 2015-12-06 ENCOUNTER — Ambulatory Visit: Payer: Medicare Other | Admitting: Radiation Oncology

## 2015-12-06 DIAGNOSIS — M1990 Unspecified osteoarthritis, unspecified site: Secondary | ICD-10-CM | POA: Diagnosis present

## 2015-12-06 DIAGNOSIS — B952 Enterococcus as the cause of diseases classified elsewhere: Secondary | ICD-10-CM | POA: Diagnosis not present

## 2015-12-06 DIAGNOSIS — B961 Klebsiella pneumoniae [K. pneumoniae] as the cause of diseases classified elsewhere: Secondary | ICD-10-CM | POA: Diagnosis present

## 2015-12-06 DIAGNOSIS — A419 Sepsis, unspecified organism: Secondary | ICD-10-CM | POA: Diagnosis not present

## 2015-12-06 DIAGNOSIS — Z9861 Coronary angioplasty status: Secondary | ICD-10-CM

## 2015-12-06 DIAGNOSIS — I2510 Atherosclerotic heart disease of native coronary artery without angina pectoris: Secondary | ICD-10-CM | POA: Diagnosis not present

## 2015-12-06 DIAGNOSIS — G9340 Encephalopathy, unspecified: Secondary | ICD-10-CM | POA: Diagnosis present

## 2015-12-06 DIAGNOSIS — T380X5A Adverse effect of glucocorticoids and synthetic analogues, initial encounter: Secondary | ICD-10-CM | POA: Diagnosis present

## 2015-12-06 DIAGNOSIS — E8809 Other disorders of plasma-protein metabolism, not elsewhere classified: Secondary | ICD-10-CM | POA: Diagnosis present

## 2015-12-06 DIAGNOSIS — A4181 Sepsis due to Enterococcus: Secondary | ICD-10-CM | POA: Diagnosis present

## 2015-12-06 DIAGNOSIS — Z1611 Resistance to penicillins: Secondary | ICD-10-CM | POA: Diagnosis present

## 2015-12-06 DIAGNOSIS — Z79899 Other long term (current) drug therapy: Secondary | ICD-10-CM

## 2015-12-06 DIAGNOSIS — C4359 Malignant melanoma of other part of trunk: Secondary | ICD-10-CM | POA: Diagnosis present

## 2015-12-06 DIAGNOSIS — R9431 Abnormal electrocardiogram [ECG] [EKG]: Secondary | ICD-10-CM

## 2015-12-06 DIAGNOSIS — D649 Anemia, unspecified: Secondary | ICD-10-CM | POA: Diagnosis present

## 2015-12-06 DIAGNOSIS — D6489 Other specified anemias: Secondary | ICD-10-CM | POA: Diagnosis present

## 2015-12-06 DIAGNOSIS — D696 Thrombocytopenia, unspecified: Secondary | ICD-10-CM | POA: Diagnosis present

## 2015-12-06 DIAGNOSIS — N390 Urinary tract infection, site not specified: Secondary | ICD-10-CM

## 2015-12-06 DIAGNOSIS — I252 Old myocardial infarction: Secondary | ICD-10-CM

## 2015-12-06 DIAGNOSIS — R001 Bradycardia, unspecified: Secondary | ICD-10-CM | POA: Diagnosis present

## 2015-12-06 DIAGNOSIS — E785 Hyperlipidemia, unspecified: Secondary | ICD-10-CM | POA: Diagnosis present

## 2015-12-06 DIAGNOSIS — I712 Thoracic aortic aneurysm, without rupture: Secondary | ICD-10-CM | POA: Diagnosis present

## 2015-12-06 DIAGNOSIS — R7303 Prediabetes: Secondary | ICD-10-CM | POA: Diagnosis present

## 2015-12-06 DIAGNOSIS — I351 Nonrheumatic aortic (valve) insufficiency: Secondary | ICD-10-CM | POA: Diagnosis present

## 2015-12-06 DIAGNOSIS — C439 Malignant melanoma of skin, unspecified: Secondary | ICD-10-CM | POA: Diagnosis present

## 2015-12-06 DIAGNOSIS — I339 Acute and subacute endocarditis, unspecified: Secondary | ICD-10-CM | POA: Diagnosis present

## 2015-12-06 DIAGNOSIS — Z91018 Allergy to other foods: Secondary | ICD-10-CM

## 2015-12-06 DIAGNOSIS — B370 Candidal stomatitis: Secondary | ICD-10-CM | POA: Diagnosis not present

## 2015-12-06 DIAGNOSIS — C799 Secondary malignant neoplasm of unspecified site: Secondary | ICD-10-CM | POA: Diagnosis present

## 2015-12-06 DIAGNOSIS — I76 Septic arterial embolism: Secondary | ICD-10-CM | POA: Diagnosis present

## 2015-12-06 DIAGNOSIS — I340 Nonrheumatic mitral (valve) insufficiency: Secondary | ICD-10-CM | POA: Diagnosis not present

## 2015-12-06 DIAGNOSIS — C7800 Secondary malignant neoplasm of unspecified lung: Secondary | ICD-10-CM | POA: Diagnosis present

## 2015-12-06 DIAGNOSIS — E876 Hypokalemia: Secondary | ICD-10-CM | POA: Diagnosis present

## 2015-12-06 DIAGNOSIS — R739 Hyperglycemia, unspecified: Secondary | ICD-10-CM | POA: Diagnosis present

## 2015-12-06 DIAGNOSIS — Z7982 Long term (current) use of aspirin: Secondary | ICD-10-CM

## 2015-12-06 DIAGNOSIS — N12 Tubulo-interstitial nephritis, not specified as acute or chronic: Secondary | ICD-10-CM | POA: Diagnosis present

## 2015-12-06 DIAGNOSIS — R6521 Severe sepsis with septic shock: Secondary | ICD-10-CM | POA: Diagnosis present

## 2015-12-06 DIAGNOSIS — C7931 Secondary malignant neoplasm of brain: Secondary | ICD-10-CM | POA: Diagnosis present

## 2015-12-06 DIAGNOSIS — R39198 Other difficulties with micturition: Secondary | ICD-10-CM | POA: Diagnosis present

## 2015-12-06 DIAGNOSIS — I330 Acute and subacute infective endocarditis: Secondary | ICD-10-CM | POA: Diagnosis not present

## 2015-12-06 DIAGNOSIS — R509 Fever, unspecified: Secondary | ICD-10-CM | POA: Diagnosis present

## 2015-12-06 DIAGNOSIS — Z7952 Long term (current) use of systemic steroids: Secondary | ICD-10-CM

## 2015-12-06 DIAGNOSIS — Z8249 Family history of ischemic heart disease and other diseases of the circulatory system: Secondary | ICD-10-CM

## 2015-12-06 DIAGNOSIS — I10 Essential (primary) hypertension: Secondary | ICD-10-CM | POA: Diagnosis present

## 2015-12-06 DIAGNOSIS — R400 Somnolence: Secondary | ICD-10-CM | POA: Diagnosis not present

## 2015-12-06 DIAGNOSIS — N400 Enlarged prostate without lower urinary tract symptoms: Secondary | ICD-10-CM | POA: Diagnosis present

## 2015-12-06 DIAGNOSIS — K219 Gastro-esophageal reflux disease without esophagitis: Secondary | ICD-10-CM | POA: Diagnosis present

## 2015-12-06 DIAGNOSIS — R4182 Altered mental status, unspecified: Secondary | ICD-10-CM

## 2015-12-06 HISTORY — DX: Atherosclerotic heart disease of native coronary artery without angina pectoris: I25.10

## 2015-12-06 HISTORY — DX: Hyperlipidemia, unspecified: E78.5

## 2015-12-06 HISTORY — DX: Liver disease, unspecified: K76.9

## 2015-12-06 HISTORY — DX: Essential (primary) hypertension: I10

## 2015-12-06 HISTORY — DX: Secondary malignant neoplasm of unspecified site: C79.9

## 2015-12-06 HISTORY — DX: Malignant melanoma of skin, unspecified: C43.9

## 2015-12-06 HISTORY — DX: Solitary pulmonary nodule: R91.1

## 2015-12-06 HISTORY — DX: Secondary malignant neoplasm of brain: C79.31

## 2015-12-06 LAB — CBC WITH DIFFERENTIAL/PLATELET
Basophils Absolute: 0 10*3/uL (ref 0.0–0.1)
Basophils Relative: 0 %
Eosinophils Absolute: 0 10*3/uL (ref 0.0–0.7)
Eosinophils Relative: 0 %
HCT: 44.6 % (ref 39.0–52.0)
Hemoglobin: 15.7 g/dL (ref 13.0–17.0)
Lymphocytes Relative: 3 %
Lymphs Abs: 0.6 10*3/uL — ABNORMAL LOW (ref 0.7–4.0)
MCH: 31 pg (ref 26.0–34.0)
MCHC: 35.2 g/dL (ref 30.0–36.0)
MCV: 88 fL (ref 78.0–100.0)
Monocytes Absolute: 2.2 10*3/uL — ABNORMAL HIGH (ref 0.1–1.0)
Monocytes Relative: 11 %
Neutro Abs: 17.5 10*3/uL — ABNORMAL HIGH (ref 1.7–7.7)
Neutrophils Relative %: 86 %
Platelets: 115 10*3/uL — ABNORMAL LOW (ref 150–400)
RBC: 5.07 MIL/uL (ref 4.22–5.81)
RDW: 13.7 % (ref 11.5–15.5)
WBC: 20.3 10*3/uL — ABNORMAL HIGH (ref 4.0–10.5)

## 2015-12-06 LAB — URINALYSIS, ROUTINE W REFLEX MICROSCOPIC
Glucose, UA: NEGATIVE mg/dL
Ketones, ur: NEGATIVE mg/dL
Nitrite: NEGATIVE
Protein, ur: 30 mg/dL — AB
Specific Gravity, Urine: 1.036 — ABNORMAL HIGH (ref 1.005–1.030)
pH: 5.5 (ref 5.0–8.0)

## 2015-12-06 LAB — LACTIC ACID, PLASMA: Lactic Acid, Venous: 1.6 mmol/L (ref 0.5–1.9)

## 2015-12-06 LAB — I-STAT CG4 LACTIC ACID, ED: LACTIC ACID, VENOUS: 2.24 mmol/L — AB (ref 0.5–1.9)

## 2015-12-06 LAB — COMPREHENSIVE METABOLIC PANEL
ALT: 30 U/L (ref 17–63)
AST: 34 U/L (ref 15–41)
Albumin: 2.6 g/dL — ABNORMAL LOW (ref 3.5–5.0)
Alkaline Phosphatase: 95 U/L (ref 38–126)
Anion gap: 8 (ref 5–15)
BUN: 39 mg/dL — ABNORMAL HIGH (ref 6–20)
CO2: 23 mmol/L (ref 22–32)
Calcium: 8.2 mg/dL — ABNORMAL LOW (ref 8.9–10.3)
Chloride: 104 mmol/L (ref 101–111)
Creatinine, Ser: 0.97 mg/dL (ref 0.61–1.24)
GFR calc Af Amer: >60 mL/min (ref >60)
GFR calc non Af Amer: >60 mL/min (ref >60)
Glucose, Bld: 142 mg/dL — ABNORMAL HIGH (ref 65–99)
Potassium: 4 mmol/L (ref 3.5–5.1)
Sodium: 135 mmol/L (ref 135–145)
Total Bilirubin: 1.2 mg/dL (ref 0.3–1.2)
Total Protein: 6.1 g/dL — ABNORMAL LOW (ref 6.5–8.1)

## 2015-12-06 LAB — URINE MICROSCOPIC-ADD ON

## 2015-12-06 LAB — PROTIME-INR
INR: 1.17
Prothrombin Time: 15 seconds (ref 11.4–15.2)

## 2015-12-06 LAB — TSH: TSH: 0.423 u[IU]/mL (ref 0.350–4.500)

## 2015-12-06 LAB — MRSA PCR SCREENING: MRSA by PCR: NEGATIVE

## 2015-12-06 MED ORDER — SODIUM CHLORIDE 0.9 % IV BOLUS (SEPSIS)
1000.0000 mL | Freq: Once | INTRAVENOUS | Status: AC
  Administered 2015-12-06 (×2): 1000 mL via INTRAVENOUS

## 2015-12-06 MED ORDER — VANCOMYCIN HCL IN DEXTROSE 750-5 MG/150ML-% IV SOLN
750.0000 mg | Freq: Three times a day (TID) | INTRAVENOUS | Status: DC
  Administered 2015-12-06 – 2015-12-07 (×2): 750 mg via INTRAVENOUS
  Filled 2015-12-06 (×3): qty 150

## 2015-12-06 MED ORDER — ATORVASTATIN CALCIUM 40 MG PO TABS
80.0000 mg | ORAL_TABLET | Freq: Every evening | ORAL | Status: DC
  Administered 2015-12-07 – 2015-12-12 (×6): 80 mg via ORAL
  Filled 2015-12-06 (×7): qty 2

## 2015-12-06 MED ORDER — SODIUM CHLORIDE 0.9 % IV SOLN
INTRAVENOUS | Status: AC
  Administered 2015-12-06: 17:00:00 via INTRAVENOUS

## 2015-12-06 MED ORDER — PANTOPRAZOLE SODIUM 40 MG PO TBEC
40.0000 mg | DELAYED_RELEASE_TABLET | Freq: Every day | ORAL | Status: DC
  Administered 2015-12-07 – 2015-12-13 (×5): 40 mg via ORAL
  Filled 2015-12-06 (×5): qty 1

## 2015-12-06 MED ORDER — VANCOMYCIN HCL IN DEXTROSE 1-5 GM/200ML-% IV SOLN
1000.0000 mg | Freq: Once | INTRAVENOUS | Status: AC
  Administered 2015-12-06: 1000 mg via INTRAVENOUS
  Filled 2015-12-06: qty 200

## 2015-12-06 MED ORDER — SODIUM CHLORIDE 0.9 % IV BOLUS (SEPSIS)
1000.0000 mL | Freq: Once | INTRAVENOUS | Status: AC
  Administered 2015-12-06: 1000 mL via INTRAVENOUS

## 2015-12-06 MED ORDER — LEVETIRACETAM 500 MG/5ML IV SOLN
750.0000 mg | Freq: Two times a day (BID) | INTRAVENOUS | Status: DC
  Administered 2015-12-06 – 2015-12-13 (×14): 750 mg via INTRAVENOUS
  Filled 2015-12-06 (×15): qty 7.5

## 2015-12-06 MED ORDER — NITROGLYCERIN 0.4 MG SL SUBL: 0.4000 mg | SUBLINGUAL_TABLET | SUBLINGUAL | Status: DC | PRN

## 2015-12-06 MED ORDER — ASPIRIN 81 MG PO CHEW
81.0000 mg | CHEWABLE_TABLET | Freq: Every day | ORAL | Status: DC
  Administered 2015-12-07 – 2015-12-08 (×2): 81 mg via ORAL
  Filled 2015-12-06 (×2): qty 1

## 2015-12-06 MED ORDER — PIPERACILLIN-TAZOBACTAM 3.375 G IVPB 30 MIN
3.3750 g | Freq: Once | INTRAVENOUS | Status: AC
  Administered 2015-12-06: 3.375 g via INTRAVENOUS
  Filled 2015-12-06: qty 50

## 2015-12-06 MED ORDER — HYDROCODONE-ACETAMINOPHEN 5-325 MG PO TABS
1.0000 | ORAL_TABLET | ORAL | Status: DC | PRN
  Filled 2015-12-06: qty 1

## 2015-12-06 MED ORDER — ENSURE ENLIVE PO LIQD
237.0000 mL | Freq: Three times a day (TID) | ORAL | Status: DC
  Administered 2015-12-08 – 2015-12-13 (×11): 237 mL via ORAL

## 2015-12-06 MED ORDER — LEVETIRACETAM 750 MG PO TABS
750.0000 mg | ORAL_TABLET | Freq: Two times a day (BID) | ORAL | Status: DC
  Filled 2015-12-06: qty 1

## 2015-12-06 MED ORDER — SODIUM CHLORIDE 0.9 % IV BOLUS (SEPSIS)
1000.0000 mL | INTRAVENOUS | Status: DC | PRN
  Administered 2015-12-07 (×2): 1000 mL via INTRAVENOUS
  Filled 2015-12-06 (×2): qty 1000

## 2015-12-06 MED ORDER — ONDANSETRON HCL 4 MG PO TABS: 4.0000 mg | ORAL_TABLET | Freq: Four times a day (QID) | ORAL | Status: DC | PRN

## 2015-12-06 MED ORDER — HYDROCORTISONE NA SUCCINATE PF 100 MG IJ SOLR
100.0000 mg | Freq: Three times a day (TID) | INTRAMUSCULAR | Status: DC
  Administered 2015-12-06 – 2015-12-08 (×5): 100 mg via INTRAVENOUS
  Filled 2015-12-06 (×5): qty 2

## 2015-12-06 MED ORDER — TAMSULOSIN HCL 0.4 MG PO CAPS
0.4000 mg | ORAL_CAPSULE | Freq: Every day | ORAL | Status: DC
Start: 2015-12-06 — End: 2015-12-13
  Administered 2015-12-07 – 2015-12-13 (×5): 0.4 mg via ORAL
  Filled 2015-12-06 (×5): qty 1

## 2015-12-06 MED ORDER — SODIUM CHLORIDE 0.9 % IV BOLUS (SEPSIS)
500.0000 mL | Freq: Once | INTRAVENOUS | Status: AC
  Administered 2015-12-06: 500 mL via INTRAVENOUS

## 2015-12-06 MED ORDER — ONDANSETRON HCL 4 MG/2ML IJ SOLN: 4.0000 mg | Freq: Four times a day (QID) | INTRAMUSCULAR | Status: DC | PRN

## 2015-12-06 MED ORDER — PIPERACILLIN-TAZOBACTAM 3.375 G IVPB
3.3750 g | Freq: Three times a day (TID) | INTRAVENOUS | Status: DC
  Administered 2015-12-06 – 2015-12-07 (×2): 3.375 g via INTRAVENOUS
  Filled 2015-12-06 (×2): qty 50

## 2015-12-06 MED ORDER — SODIUM CHLORIDE 0.9% FLUSH
3.0000 mL | Freq: Two times a day (BID) | INTRAVENOUS | Status: DC
  Administered 2015-12-06 – 2015-12-12 (×12): 3 mL via INTRAVENOUS

## 2015-12-06 NOTE — ED Notes (Signed)
Code sepsis 

## 2015-12-06 NOTE — ED Triage Notes (Signed)
Patient presents from [facility] re[facility] via EMS for possible sepsis. Chemo yesterday. Staff reports AMS with minimal speech.   Last VS: 24resp, 92/57, 87hr, 92%ra.  20g left AC

## 2015-12-06 NOTE — H&P (Signed)
TRH H&P   Patient Demographics:    Chad Ball, is a 70 y.o. male  MRN: [id number]   DOB - 04-Feb-1946  Admit Date - 12/06/2015  Outpatient Primary MD for the patient is Chad Lopes, MD  Outpatient Specialists: Dr Lisbeth Renshaw  - Rad Onc    Patient coming from: SNF  No chief complaint on file.     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 MRI brain confirmed these findings, including at least 2 separate lesions in the right cerebellum and bilateral temporal lobe lesions. CT of the chest abdomen and pelvis showed 2 low-density nodules in the liver, most likely cysts, and a CT of the chest showed a 2.4 x 1.8 cm left upper lobe nodule, with pathologic lymphadenopathy in the left hilum.  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 He had presented at that time with confusion, he was seen by neurosurgeon Dr. Severiano Gilbert and radiation oncologist Dr. Lisbeth Renshaw.  He now comes in from nursing home with low-grade fevers, low blood pressure and encephalopathy, in the ER found to have urosepsis, patient is encephalopathic but says no to all questions and denies any headache chest or abdominal  pain. Does have leukocytosis and UA suggestive of UTI. I was called to admit him for sepsis.   Review of systems:    Unreliable due to encephalopathy  With Past History of the following :    Past Medical History:  Diagnosis Date  . Arthritis   . GERD (gastroesophageal reflux disease)   . Myocardial infarction [facility])       Past Surgical History:  Procedure Laterality Date  . APPENDECTOMY    . HERNIA REPAIR    . RADIOLOGY WITH ANESTHESIA N/A 11/26/2015   Procedure: MRI BRAIN WITH WITH AND WITHOUT CONTRAST;  Surgeon: Medication Radiologist, MD;  Location: [facility];  Service: Radiology;  Laterality: N/A;      Social History:     Social History  Substance Use Topics  . Smoking status: Never Smoker  . Smokeless tobacco: Never Used  . Alcohol use Not on file         Family History :     Family History  Problem Relation Age of Onset  . Heart disease Mother   . Hyperlipidemia Mother        Home Medications:   Prior to Admission medications   Medication Sig Start Date End Date Taking? Authorizing Provider  acetaminophen (TYLENOL) 325 MG tablet Take 650 mg by mouth every 6 (six) hours as needed for moderate pain or headache.    Yes Historical Provider, MD  aspirin 81 MG chewable tablet Chew 81 mg by mouth daily.   Yes Historical Provider, MD  atorvastatin (LIPITOR) 80 MG tablet Take 80 mg by mouth every evening.    Yes Historical Provider, MD  carvedilol (COREG) 3.125 MG tablet Take 1 tablet (3.125 mg total) by mouth 2 (two) times daily with a meal. 12/05/15  Yes Mariel Aloe, MD  dexamethasone (DECADRON) 4 MG tablet Take 4 mg by mouth 4 (four) times daily.   Yes Historical Provider, MD  feeding supplement, ENSURE ENLIVE, (ENSURE ENLIVE) LIQD Take 237 mLs by mouth 3 (three) times daily between meals. 12/05/15  Yes Mariel Aloe, MD  levETIRAcetam (KEPPRA) 750 MG tablet Take 1 tablet (750 mg total) by mouth 2 (two) times daily. 12/05/15  Yes Mariel Aloe, MD  losartan  (COZAAR) 25 MG tablet Take 12.5 mg by mouth daily.    Yes Historical Provider, MD  Multiple Vitamin (MULTIVITAMIN WITH MINERALS) TABS tablet Take 1 tablet by mouth daily.   Yes Historical Provider, MD  nitroGLYCERIN (NITROSTAT) 0.4 MG SL tablet Place 0.4 mg under the tongue every 5 (five) minutes as needed for chest pain.   Yes Historical Provider, MD  omeprazole (PRILOSEC) 20 MG capsule Take 20 mg by mouth daily before breakfast.    Yes Historical Provider, MD  oxyCODONE (OXY IR/ROXICODONE) 5 MG immediate release tablet Take 5-10 mg by mouth every 4 (four) hours as needed for moderate pain or severe pain.   Yes Historical Provider, MD  polyethylene glycol (MIRALAX / GLYCOLAX) packet Take 17 g by mouth daily.   Yes Historical Provider, MD  senna-docusate (SENOKOT-S) 8.6-50 MG tablet Take 1 tablet by mouth daily.   Yes Historical Provider, MD     Allergies:     Allergies  Allergen Reactions  . Food Anaphylaxis and Other (See Comments)    Pt is allergic to [place] nuts.      Physical Exam:   Vitals  Blood pressure (!) 95/50, pulse (!) 59, temperature 97.5 F (36.4 C), temperature source Oral, resp. rate 21, weight 79.4 kg (175 lb), SpO2 96 %.   1. GeneralElderly white male lying in hospital bed in no apparent distress but visibly confused  2. Confused.  3. No F.N deficits, unable to follow commands but moves all 4 extremities to self.  4. Ears and Eyes appear Normal, Conjunctivae clear, PERRLA. Moist Oral Mucosa.  5. Supple Neck, No JVD, No cervical lymphadenopathy appriciated, No Carotid Bruits.  6. Symmetrical Chest wall movement, Good air movement bilaterally, CTAB.  7. RRR, No Gallops, Rubs or Murmurs, No Parasternal Heave.  8. Positive Bowel Sounds, Abdomen Soft, No tenderness, No organomegaly appriciated,No rebound -guarding or  rigidity.  9.  No Cyanosis, Normal Skin Turgor, No Skin Rash or Bruise.  10. Good muscle tone,  joints appear normal , no effusions, Normal  ROM.  11. No Palpable Lymph Nodes in Neck or Axillae     Data Review:    CBC  Recent Labs Lab 12/02/15 0339 12/06/15 1436  WBC 22.2* 20.3*  HGB 14.9 15.7  HCT 42.4 44.6  PLT 186 115*  MCV 89.3 88.0  MCH 31.4 31.0  MCHC 35.1 35.2  RDW 13.3 13.7  LYMPHSABS  --  0.6*  MONOABS  --  2.2*  EOSABS  --  0.0  BASOSABS  --  0.0   ------------------------------------------------------------------------------------------------------------------  Chemistries   Recent Labs Lab 12/01/15 0415 12/02/15 0339 12/06/15 1436  NA 139 135 135  K 4.3 4.1 4.0  CL 107 102 104  CO2 25 26 23   GLUCOSE 136* 135* 142*  BUN 31* 34* 39*  CREATININE 0.84 0.87 0.97  CALCIUM 8.6* 8.3* 8.2*  AST  --   --  34  ALT  --   --  30  ALKPHOS  --   --  95  BILITOT  --   --  1.2   ------------------------------------------------------------------------------------------------------------------ estimated creatinine clearance is 80.7 mL/min (by C-G formula based on SCr of 0.97 mg/dL). ------------------------------------------------------------------------------------------------------------------ No results for input(s): TSH, T4TOTAL, T3FREE, THYROIDAB in the last 72 hours.  Invalid input(s): FREET3  Coagulation profile No results for input(s): INR, PROTIME in the last 168 hours. ------------------------------------------------------------------------------------------------------------------- No results for input(s): DDIMER in the last 72 hours. -------------------------------------------------------------------------------------------------------------------  Cardiac Enzymes No results for input(s): CKMB, TROPONINI, MYOGLOBIN in the last 168 hours.  Invalid input(s): CK ------------------------------------------------------------------------------------------------------------------ No results found for:  BNP   ---------------------------------------------------------------------------------------------------------------  Urinalysis    Component Value Date/Time   COLORURINE AMBER (A) 12/06/2015 1400   APPEARANCEUR CLOUDY (A) 12/06/2015 1400   LABSPEC 1.036 (H) 12/06/2015 1400   PHURINE 5.5 12/06/2015 1400   GLUCOSEU NEGATIVE 12/06/2015 1400   HGBUR LARGE (A) 12/06/2015 1400   BILIRUBINUR SMALL (A) 12/06/2015 1400   BILIRUBINUR large (A) 09/02/2015 1054   KETONESUR NEGATIVE 12/06/2015 1400   PROTEINUR 30 (A) 12/06/2015 1400   UROBILINOGEN 1.0 09/02/2015 1054   NITRITE NEGATIVE 12/06/2015 1400   LEUKOCYTESUR SMALL (A) 12/06/2015 1400    ----------------------------------------------------------------------------------------------------------------   Imaging Results:    Dg Chest 2 View  Result Date: 12/06/2015 CLINICAL DATA:  Fever; possible sepsis; chemo yesterday for brain ca EXAM: CHEST  2 VIEW COMPARISON:  Chest CT, 11/27/2015 FINDINGS: Left upper lobe mass without significant change from the prior study. Remainder of the lungs is clear. No pleural effusion or pneumothorax. Cardiac silhouette is normal in size. No mediastinal or hilar masses or evidence of adenopathy. Bony thorax is intact. IMPRESSION: No acute cardiopulmonary disease. Electronically Signed   By: Lajean Manes M.D.   On: 12/06/2015 15:07    My personal review of EKG: Rhythm Sinus tachycardia no Acute ST changes   Assessment & Plan:      1. Sepsis due to UTI. He did have a Foley during his recent hospitalization and urine as a digestive of UTI, bladder scan post void was reassuring with 100 mL of residual urine, sepsis protocol has been followed he has received IV fluid bolus and then maintenance, will trend lactic acid levels, appropriate cultures have been obtained and he has been placed on broad-spectrum IV antibiotics which are vancomycin and Zosyn. Stress dose steroids as he is on Decadron, Once cultures are  finalized  will narrow antibiotics. Chest x-ray is assuring and he has no cough or shortness of breath. No other subjective complaints of source of infection at this time.  2. 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 Undergoing radiation treatment by Dr. Lisbeth Renshaw, steroids will be switched to stress dose and monitor.  3. Underlying history of BPH. Could have contributed to UTI, start Flomax. Post word residuals stable at this time.  4. Essential hypertension and CAD. No acute issues. Hold bp medications as per pressure is low. Continue aspirin and statin for secondary prevention.  5. Dyslipidemia. Continue statin.    DVT Prophylaxis   SCDs    AM Labs Ordered, also please review Full Orders  Family Communication: Admission, patients condition and plan of care including tests being ordered have been discussed with the patient and family* who indicate understanding and agree with the plan and Code Status.  Code Status Full  Likely DC to  TBD  Condition GUARDED    Consults called: None    Admission status: Inpt    Time spent in minutes : 35   Aubrey Voong K M.D on 12/06/2015 at 4:38 PM  Between 7am to 7pm - Pager - [phone]. After 7pm go to [email or web address] - password [facility]  [facility] - Office  [phone]

## 2015-12-06 NOTE — Telephone Encounter (Signed)
Called [facility] ,spokw with Judeen Hammans , please arrange trasnportaion for patient to be here  For his 330pm  Rad tx today, willfax them his schedule. To heather Oden 9:22 AM

## 2015-12-06 NOTE — ED Provider Notes (Signed)
[facility] DEPT Provider Note   CSN: [id number] Arrival date & time: 12/06/15  1333  First Provider Contact:  First MD Initiated Contact with Patient 12/06/15 1343        History   Chief Complaint No chief complaint on file.   HPI Chad Ball is a 70 y.o. male.  HPI Patient presents to the emergency department with altered mental status from a nursing facility.  The patient is unable to give me any history due to his altered mental status.  The nursing facility states that he was admitted yesterday but has increased confusion over the last 24 hours Past Medical History:  Diagnosis Date  . Arthritis   . GERD (gastroesophageal reflux disease)   . Myocardial infarction [facility])     Patient Active Problem List   Diagnosis Date Noted  . Lung nodule   . Liver lesion   . Brain tumor ([place])   . Metastatic melanoma ([place])   . Brain mass 11/24/2015  . Melanoma of skin ([place]) 11/24/2015  . Brain metastasis ([place]) 11/24/2015  . Essential hypertension 09/02/2015  . Hyperlipidemia 09/02/2015  . CAD in native artery 09/02/2015    Past Surgical History:  Procedure Laterality Date  . APPENDECTOMY    . HERNIA REPAIR    . RADIOLOGY WITH ANESTHESIA N/A 11/26/2015   Procedure: MRI BRAIN WITH WITH AND WITHOUT CONTRAST;  Surgeon: Medication Radiologist, MD;  Location: [place] [facility];  Service: Radiology;  Laterality: N/A;       Home Medications    Prior to Admission medications   Medication Sig Start Date End Date Taking? Authorizing Provider  acetaminophen (TYLENOL) 325 MG tablet Take 650 mg by mouth every 6 (six) hours as needed for moderate pain or headache.    Yes Historical Provider, MD  aspirin 81 MG chewable tablet Chew 81 mg by mouth daily.   Yes Historical Provider, MD  atorvastatin (LIPITOR) 80 MG tablet Take 80 mg by mouth every evening.    Yes Historical Provider, MD  carvedilol (COREG) 3.125 MG tablet Take 1 tablet (3.125 mg total) by mouth 2 (two) times daily with a meal.  12/05/15  Yes Mariel Aloe, MD  dexamethasone (DECADRON) 4 MG tablet Take 4 mg by mouth 4 (four) times daily.   Yes Historical Provider, MD  feeding supplement, ENSURE ENLIVE, (ENSURE ENLIVE) LIQD Take 237 mLs by mouth 3 (three) times daily between meals. 12/05/15  Yes Mariel Aloe, MD  levETIRAcetam (KEPPRA) 750 MG tablet Take 1 tablet (750 mg total) by mouth 2 (two) times daily. 12/05/15  Yes Mariel Aloe, MD  losartan (COZAAR) 25 MG tablet Take 12.5 mg by mouth daily.    Yes Historical Provider, MD  Multiple Vitamin (MULTIVITAMIN WITH MINERALS) TABS tablet Take 1 tablet by mouth daily.   Yes Historical Provider, MD  nitroGLYCERIN (NITROSTAT) 0.4 MG SL tablet Place 0.4 mg under the tongue every 5 (five) minutes as needed for chest pain.   Yes Historical Provider, MD  omeprazole (PRILOSEC) 20 MG capsule Take 20 mg by mouth daily before breakfast.    Yes Historical Provider, MD  oxyCODONE (OXY IR/ROXICODONE) 5 MG immediate release tablet Take 5-10 mg by mouth every 4 (four) hours as needed for moderate pain or severe pain.   Yes Historical Provider, MD  polyethylene glycol (MIRALAX / GLYCOLAX) packet Take 17 g by mouth daily.   Yes Historical Provider, MD  senna-docusate (SENOKOT-S) 8.6-50 MG tablet Take 1 tablet by mouth daily.   Yes Historical  Provider, MD    Family History Family History  Problem Relation Age of Onset  . Heart disease Mother   . Hyperlipidemia Mother     Social History Social History  Substance Use Topics  . Smoking status: Never Smoker  . Smokeless tobacco: Never Used  . Alcohol use Not on file     Allergies   Food   Review of Systems Review of Systems Level V caveat applies due to altered mental status and sepsis  Physical Exam Updated Vital Signs BP (!) 95/50   Pulse (!) 59   Temp 97.5 F (36.4 C) (Oral)   Resp 21   Wt 79.4 kg   SpO2 96%   BMI 23.09 kg/m   Physical Exam  Constitutional: He appears well-developed and well-nourished. No  distress.  HENT:  Head: Normocephalic and atraumatic.  Mouth/Throat: Oropharynx is clear and moist.  Eyes: Pupils are equal, round, and reactive to light.  Neck: Normal range of motion. Neck supple.  Cardiovascular: Normal rate, regular rhythm and normal heart sounds.  Exam reveals no gallop and no friction rub.   No murmur heard. Pulmonary/Chest: Effort normal and breath sounds normal. No respiratory distress. He has no wheezes.  Abdominal: Soft. Bowel sounds are normal. He exhibits no distension. There is no tenderness.  Neurological: He exhibits normal muscle tone.  Skin: Skin is warm and dry. No rash noted. No erythema.  Psychiatric: He has a normal mood and affect. His behavior is normal.  Nursing note and vitals reviewed.    ED Treatments / Results  Labs (all labs ordered are listed, but only abnormal results are displayed) Labs Reviewed  COMPREHENSIVE METABOLIC PANEL - Abnormal; Notable for the following:       Result Value   Glucose, Bld 142 (*)    BUN 39 (*)    Calcium 8.2 (*)    Total Protein 6.1 (*)    Albumin 2.6 (*)    All other components within normal limits  CBC WITH DIFFERENTIAL/PLATELET - Abnormal; Notable for the following:    WBC 20.3 (*)    Platelets 115 (*)    Neutro Abs 17.5 (*)    Lymphs Abs 0.6 (*)    Monocytes Absolute 2.2 (*)    All other components within normal limits  URINALYSIS, ROUTINE W REFLEX MICROSCOPIC (NOT AT [facility]) - Abnormal; Notable for the following:    Color, Urine AMBER (*)    APPearance CLOUDY (*)    Specific Gravity, Urine 1.036 (*)    Hgb urine dipstick LARGE (*)    Bilirubin Urine SMALL (*)    Protein, ur 30 (*)    Leukocytes, UA SMALL (*)    All other components within normal limits  URINE MICROSCOPIC-ADD ON - Abnormal; Notable for the following:    Squamous Epithelial / LPF 6-30 (*)    Bacteria, UA MANY (*)    All other components within normal limits  I-STAT CG4 LACTIC ACID, ED - Abnormal; Notable for the following:     Lactic Acid, Venous 2.24 (*)    All other components within normal limits  CULTURE, BLOOD (ROUTINE X 2)  CULTURE, BLOOD (ROUTINE X 2)  URINE CULTURE    EKG  EKG Interpretation None       Radiology Dg Chest 2 View  Result Date: 12/06/2015 CLINICAL DATA:  Fever; possible sepsis; chemo yesterday for brain ca EXAM: CHEST  2 VIEW COMPARISON:  Chest CT, 11/27/2015 FINDINGS: Left upper lobe mass without significant change from the  prior study. Remainder of the lungs is clear. No pleural effusion or pneumothorax. Cardiac silhouette is normal in size. No mediastinal or hilar masses or evidence of adenopathy. Bony thorax is intact. IMPRESSION: No acute cardiopulmonary disease. Electronically Signed   By: Lajean Manes M.D.   On: 12/06/2015 15:07    Procedures Procedures (including critical care time)  Medications Ordered in ED Medications  vancomycin (VANCOCIN) IVPB 1000 mg/200 mL premix (1,000 mg Intravenous New Bag/Given 12/06/15 1509)  vancomycin (VANCOCIN) IVPB 750 mg/150 ml premix (not administered)  piperacillin-tazobactam (ZOSYN) IVPB 3.375 g (not administered)  sodium chloride 0.9 % bolus 1,000 mL (0 mLs Intravenous Stopped 12/06/15 1550)    And  sodium chloride 0.9 % bolus 1,000 mL (1,000 mLs Intravenous New Bag/Given 12/06/15 1509)    And  sodium chloride 0.9 % bolus 500 mL (500 mLs Intravenous New Bag/Given 12/06/15 1545)  piperacillin-tazobactam (ZOSYN) IVPB 3.375 g (0 g Intravenous Stopped 12/06/15 1551)     Initial Impression / Assessment and Plan / ED Course  I have reviewed the triage vital signs and the nursing notes.  Pertinent labs & imaging results that were available during my care of the patient were reviewed by me and considered in my medical decision making (see chart for details).  Clinical Course    CRITICAL CARE Performed by: Brent General Total critical care time:45 minutes Critical care time was exclusive of separately billable procedures and  treating other patients. Critical care was necessary to treat or prevent imminent or life-threatening deterioration. Critical care was time spent personally by me on the following activities: development of treatment plan with patient and/or surrogate as well as nursing, discussions with consultants, evaluation of patient's response to treatment, examination of patient, obtaining history from patient or surrogate, ordering and performing treatments and interventions, ordering and review of laboratory studies, ordering and review of radiographic studies, pulse oximetry and re-evaluation of patient's condition.   Final Clinical Impressions(s) / ED Diagnoses   Final diagnoses:  None   Patient be admitted to the hospital for sepsis with the [facility] hospitalist, who will admit him for further evaluation.  I explained to the family and the pain condition and the need for admission.  I spoke to them at length about how significant this could be.  They understand and all questions were answered New Prescriptions New Prescriptions   No medications on file     Dalia Heading, PA-C 12/06/15 1618    Lacretia Leigh, MD 12/07/15 [phone]

## 2015-12-06 NOTE — ED Provider Notes (Signed)
Medical screening examination/treatment/procedure(s) were conducted as a shared visit with non-physician practitioner(s) and myself.  I personally evaluated the patient during the encounter.   EKG Interpretation None     Patient here with decreased mental status from nursing home. Patient is also receiving chemotherapy. Exam he is minimally responsive but protecting his airway. Was just discharged the hospital yesterday and suspect that he likely has HCAP patient started on antibiotics and will be admitted   Lacretia Leigh, MD 12/06/15 1526

## 2015-12-06 NOTE — Progress Notes (Signed)
Pharmacy Antibiotic Note  Chad Ball is a 70 y.o. male admitted on 12/06/2015 with sepsis. PMH CAD w/ MI s/p stent, arthritis, GERD, and metastatic melanoma; recently admitted 7/30-8/10 for confusion and headaches during which brain mets were identified. Discharged to SNF and started radiation on 8/10 but returning today for AMS and elevated lactate. Pharmacy has been consulted for vancomycin and Zosyn dosing.  Plan:  Vancomycin 1000 mg IV now, then 750 mg IV q8 hr; goal trough 15-20 mcg/mL  Measure vancomycin trough levels at steady state as indicated  Zosyn 3.375 g IV given once over 30 minutes, then every 8 hrs by 4-hr infusion    Weight: 175 lb (79.4 kg)  Temp (24hrs), Avg:97.5 F (36.4 C), Min:97.5 F (36.4 C), Max:97.5 F (36.4 C)   Recent Labs Lab 12/01/15 0415 12/02/15 0339 12/06/15 1455  WBC  --  22.2*  --   CREATININE 0.84 0.87  --   LATICACIDVEN  --   --  2.24*    Estimated Creatinine Clearance: 90 mL/min (by C-G formula based on SCr of 0.87 mg/dL).    Allergies  Allergen Reactions  . Food Anaphylaxis and Other (See Comments)    Pt is allergic to [place] nuts.     Antimicrobials this admission: Vancomycin 8/11 >>  Zosyn 8/11 >>   Dose adjustments this admission: ---  Microbiology results: 8/11 BCx: sent 8/11 UCx: sent    Thank you for allowing pharmacy to be a part of this patient's care.  Reuel Boom, PharmD, BCPS Pager: [phone] 12/06/2015, 3:33 PM

## 2015-12-07 ENCOUNTER — Encounter (HOSPITAL_COMMUNITY): Payer: Self-pay | Admitting: Internal Medicine

## 2015-12-07 ENCOUNTER — Inpatient Hospital Stay (HOSPITAL_COMMUNITY): Payer: Medicare Other

## 2015-12-07 ENCOUNTER — Other Ambulatory Visit: Payer: Self-pay

## 2015-12-07 DIAGNOSIS — R9431 Abnormal electrocardiogram [ECG] [EKG]: Secondary | ICD-10-CM

## 2015-12-07 DIAGNOSIS — I10 Essential (primary) hypertension: Secondary | ICD-10-CM

## 2015-12-07 DIAGNOSIS — G939 Disorder of brain, unspecified: Secondary | ICD-10-CM

## 2015-12-07 DIAGNOSIS — G9340 Encephalopathy, unspecified: Secondary | ICD-10-CM | POA: Diagnosis present

## 2015-12-07 DIAGNOSIS — I330 Acute and subacute infective endocarditis: Secondary | ICD-10-CM

## 2015-12-07 DIAGNOSIS — I76 Septic arterial embolism: Secondary | ICD-10-CM

## 2015-12-07 DIAGNOSIS — E8809 Other disorders of plasma-protein metabolism, not elsewhere classified: Secondary | ICD-10-CM | POA: Diagnosis present

## 2015-12-07 DIAGNOSIS — B952 Enterococcus as the cause of diseases classified elsewhere: Secondary | ICD-10-CM

## 2015-12-07 DIAGNOSIS — I2510 Atherosclerotic heart disease of native coronary artery without angina pectoris: Secondary | ICD-10-CM

## 2015-12-07 DIAGNOSIS — A4181 Sepsis due to Enterococcus: Secondary | ICD-10-CM

## 2015-12-07 DIAGNOSIS — A419 Sepsis, unspecified organism: Secondary | ICD-10-CM

## 2015-12-07 DIAGNOSIS — R739 Hyperglycemia, unspecified: Secondary | ICD-10-CM

## 2015-12-07 DIAGNOSIS — R6521 Severe sepsis with septic shock: Secondary | ICD-10-CM

## 2015-12-07 DIAGNOSIS — R509 Fever, unspecified: Secondary | ICD-10-CM

## 2015-12-07 DIAGNOSIS — I339 Acute and subacute endocarditis, unspecified: Secondary | ICD-10-CM

## 2015-12-07 DIAGNOSIS — E785 Hyperlipidemia, unspecified: Secondary | ICD-10-CM

## 2015-12-07 LAB — BLOOD CULTURE ID PANEL (REFLEXED)
Acinetobacter baumannii: NOT DETECTED
Candida albicans: NOT DETECTED
Candida glabrata: NOT DETECTED
Candida krusei: NOT DETECTED
Candida parapsilosis: NOT DETECTED
Candida tropicalis: NOT DETECTED
Carbapenem resistance: NOT DETECTED
Enterobacter cloacae complex: NOT DETECTED
Enterobacteriaceae species: NOT DETECTED
Enterococcus species: DETECTED — AB
Escherichia coli: NOT DETECTED
Haemophilus influenzae: NOT DETECTED
Klebsiella oxytoca: NOT DETECTED
Klebsiella pneumoniae: NOT DETECTED
Listeria monocytogenes: NOT DETECTED
Methicillin resistance: NOT DETECTED
Neisseria meningitidis: NOT DETECTED
Proteus species: NOT DETECTED
Pseudomonas aeruginosa: NOT DETECTED
Serratia marcescens: NOT DETECTED
Staphylococcus aureus (BCID): NOT DETECTED
Staphylococcus species: NOT DETECTED
Streptococcus agalactiae: NOT DETECTED
Streptococcus pneumoniae: NOT DETECTED
Streptococcus pyogenes: NOT DETECTED
Streptococcus species: NOT DETECTED
Vancomycin resistance: NOT DETECTED

## 2015-12-07 LAB — CBC
HEMATOCRIT: 36 % — AB (ref 39.0–52.0)
HEMOGLOBIN: 12.3 g/dL — AB (ref 13.0–17.0)
MCH: 30.6 pg (ref 26.0–34.0)
MCHC: 34.2 g/dL (ref 30.0–36.0)
MCV: 89.6 fL (ref 78.0–100.0)
Platelets: 83 10*3/uL — ABNORMAL LOW (ref 150–400)
RBC: 4.02 MIL/uL — ABNORMAL LOW (ref 4.22–5.81)
RDW: 14.1 % (ref 11.5–15.5)
WBC: 13.9 10*3/uL — ABNORMAL HIGH (ref 4.0–10.5)

## 2015-12-07 LAB — BASIC METABOLIC PANEL
ANION GAP: 4 — AB (ref 5–15)
BUN: 24 mg/dL — AB (ref 6–20)
CHLORIDE: 112 mmol/L — AB (ref 101–111)
CO2: 20 mmol/L — AB (ref 22–32)
Calcium: 6.8 mg/dL — ABNORMAL LOW (ref 8.9–10.3)
Creatinine, Ser: 0.71 mg/dL (ref 0.61–1.24)
GFR calc Af Amer: >60 mL/min (ref >60)
GFR calc non Af Amer: >60 mL/min (ref >60)
GLUCOSE: 105 mg/dL — AB (ref 65–99)
POTASSIUM: 3.7 mmol/L (ref 3.5–5.1)
Sodium: 136 mmol/L (ref 135–145)

## 2015-12-07 LAB — TSH: TSH: 0.228 u[IU]/mL — ABNORMAL LOW (ref 0.350–4.500)

## 2015-12-07 LAB — MAGNESIUM: MAGNESIUM: 1.6 mg/dL — AB (ref 1.7–2.4)

## 2015-12-07 LAB — ECHOCARDIOGRAM COMPLETE
Height: 74 in
Weight: 2832.47 oz

## 2015-12-07 LAB — TROPONIN I
TROPONIN I: 0.13 ng/mL — AB (ref <0.03)
TROPONIN I: 0.12 ng/mL — AB (ref <0.03)

## 2015-12-07 LAB — PHOSPHORUS: PHOSPHORUS: 2.9 mg/dL (ref 2.5–4.6)

## 2015-12-07 MED ORDER — SODIUM CHLORIDE 0.9 % IV SOLN
2.0000 g | INTRAVENOUS | Status: DC
  Administered 2015-12-07 – 2015-12-13 (×34): 2 g via INTRAVENOUS
  Filled 2015-12-07 (×40): qty 2000

## 2015-12-07 MED ORDER — ATROPINE SULFATE 1 MG/10ML IJ SOSY: 1.0000 mg | PREFILLED_SYRINGE | Freq: Once | INTRAMUSCULAR | Status: AC

## 2015-12-07 MED ORDER — SODIUM CHLORIDE 0.9 % IV SOLN
INTRAVENOUS | Status: AC
  Administered 2015-12-08: 01:00:00 via INTRAVENOUS

## 2015-12-07 MED ORDER — ATROPINE SULFATE 1 MG/10ML IJ SOSY
PREFILLED_SYRINGE | INTRAMUSCULAR | Status: AC
  Administered 2015-12-07: 1 mg
  Filled 2015-12-07: qty 10

## 2015-12-07 MED ORDER — MAGNESIUM SULFATE 2 GM/50ML IV SOLN
2.0000 g | Freq: Once | INTRAVENOUS | Status: AC
Start: 2015-12-07 — End: 2015-12-07
  Administered 2015-12-07: 2 g via INTRAVENOUS
  Filled 2015-12-07: qty 50

## 2015-12-07 MED ORDER — AMPICILLIN SODIUM 2 G IJ SOLR
2.0000 g | INTRAMUSCULAR | Status: DC
  Administered 2015-12-07 (×2): 2 g via INTRAVENOUS
  Filled 2015-12-07 (×4): qty 2000

## 2015-12-07 MED ORDER — ATROPINE SULFATE 1 MG/10ML IJ SOSY
PREFILLED_SYRINGE | INTRAMUSCULAR | Status: AC
  Administered 2015-12-07: 1 mg via INTRAVENOUS
  Filled 2015-12-07: qty 10

## 2015-12-07 MED ORDER — AMPICILLIN SODIUM 2 G IJ SOLR: 2.0000 g | INTRAMUSCULAR | Status: DC

## 2015-12-07 MED ORDER — ALBUMIN HUMAN 25 % IV SOLN
25.0000 g | Freq: Once | INTRAVENOUS | Status: AC
  Administered 2015-12-07: 25 g via INTRAVENOUS
  Filled 2015-12-07: qty 50

## 2015-12-07 MED ORDER — SODIUM CHLORIDE 0.9 % IV SOLN
1.0000 g | Freq: Once | INTRAVENOUS | Status: AC
  Administered 2015-12-07: 1 g via INTRAVENOUS
  Filled 2015-12-07: qty 10

## 2015-12-07 MED ORDER — ALBUMIN HUMAN 25 % IV SOLN
INTRAVENOUS | Status: AC
Start: 2015-12-07 — End: 2015-12-07
  Filled 2015-12-07: qty 50

## 2015-12-07 MED ORDER — CALCIUM CARBONATE 1250 MG/5ML PO SUSP
500.0000 mg | Freq: Three times a day (TID) | ORAL | Status: DC
  Administered 2015-12-07 – 2015-12-13 (×15): 500 mg via ORAL
  Filled 2015-12-07 (×22): qty 5

## 2015-12-07 MED ORDER — SODIUM CHLORIDE 0.9 % IV SOLN
3.0000 g | Freq: Four times a day (QID) | INTRAVENOUS | Status: DC
  Filled 2015-12-07: qty 3

## 2015-12-07 MED ORDER — ATROPINE SULFATE 1 MG/ML IJ SOLN
1.0000 mg | INTRAMUSCULAR | Status: DC | PRN
  Administered 2015-12-08 (×2): 1 mg via INTRAVENOUS
  Filled 2015-12-07 (×3): qty 1

## 2015-12-07 MED ORDER — GENTAMICIN SULFATE 40 MG/ML IJ SOLN
7.0000 mg/kg | INTRAVENOUS | Status: DC
  Administered 2015-12-07: 560 mg via INTRAVENOUS
  Filled 2015-12-07: qty 14

## 2015-12-07 NOTE — Progress Notes (Signed)
Progress Note    Chad Ball  [id number] DOB: 02/21/1946  DOA: 12/06/2015 PCP: [name], MD    Brief Narrative:   Chad Ball is an 70 y.o. 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 Workup in the ED was consistent with sepsis from a UTI.  Assessment/Plan:   Principal Problem:   Enterococcal sepsis due to complicated urinary tract infection ([place]) complicated by acute encephalopathy Patient was admitted as inpatient due to intensity of service and high risk for decompensation. He has a chronic indwelling Foley with evidence of urinary retention (bladder scan performed with 100 mL of residual urine noted). Continue Flomax. He remains hypotensive despite IV fluid bolus, but his lactate has cleared and his WBC count is improving. Broad-spectrum antibiotics with vancomycin/Zosyn were initiated in the ED. Since he is chronically on Decadron for his brain metastasis, stress dose steroids were initiated as well. Preliminary blood cultures are positive for enterococcus.  Active Problems:   Hypocalcemia/hypoalbuminemia Calcium 7.92 when corrected for low albumin. Likely from sepsis (was normal on 11/25/15).  Less likely tumor lysis (other electrolytes OK). Check phosphorus. Correct calcium as it may be the cause of his bradycardia. Will also give albumin to help increase calcium levels.    Sinus bradycardia Check TSH.  Replace calcium as low calcium can be associated with heart rhythm abnormalities. Given Atropine 1 mg for sustained HR in the 30s-40s. Can use PRN. HR improved to 70's.  Will get cardiologist to evaluate. Lethargic but otherwise  asymptomatic.  Check 12 lead EKG.    Hyperglycemia Likely steroid-induced. Check hemoglobin A1c.    Essential hypertension Currently hypotensive and usual antihypertensives remain on hold. Continue IV fluids at 125 mL per hour.    Hyperlipidemia/CAD in native artery/abnormal EKG Continue aspirin and Lipitor. Cycle troponins given ST & T wave abnormalities on EKG worrisome for anterolateral ischemia.    Metastatic melanoma (HCC)/brain metastasis Continue stress dose steroids and Keppra.   Family Communication/Anticipated D/C date and plan/Code Status   DVT prophylaxis: SCDs ordered. Code Status: Full Code.  Family Communication: Wife updated at bedside. Disposition Plan: From SNF.   Medical Consultants:    Cardiology   Procedures:    None  Anti-Infectives:   Vancomycin 12/06/15---> Zosyn 12/06/15--->  Subjective:    Chad Ball is lethargic but opens eyes to voice and answers questions appropriately.  Denies chest pain/pressure, dyspnea, dizziness.    Objective:    Vitals:   12/07/15 0400 12/07/15 0500 12/07/15 0600 12/07/15 0605  BP: (!) 100/51  (!) 84/44 (!) 95/53  Pulse:  (!) 43 (!) 43 (!) 53  Resp: 11 14 14 14   Temp: 97.1 F (36.2 C)     TempSrc: Axillary     SpO2: 100% 97% 97% 99%  Weight: 80.3 kg (177 lb 0.5 oz)     Height:        Intake/Output Summary (Last 24 hours) at 12/07/15 0731 Last data filed at 12/07/15 0626  Gross per 24 hour  Intake          3077.08 ml  Output              700  ml  Net          2377.08 ml   Filed Weights   12/06/15 1514 12/06/15 1735 12/07/15 0400  Weight: 79.4 kg (175 lb) 76.5 kg (168 lb 10.4 oz) 80.3 kg (177 lb 0.5 oz)    Exam: General exam: Appears calm and comfortable, but is a bit lethargic. Respiratory system: Clear to auscultation. Respiratory effort normal. Cardiovascular system: S1 & S2 heard, bradycardic. No JVD,  rubs, gallops or clicks. No murmurs. Gastrointestinal system: Abdomen is  nondistended, soft and nontender. No organomegaly or masses felt. Normal bowel sounds heard. Central nervous system: Lethargic. No focal neurological deficits. Extremities: No clubbing,  or cyanosis. No edema. Skin: No rashes, lesions or ulcers. Psychiatry: Judgement and insight appear normal. Mood & affect appropriate.   Data Reviewed:   I have personally reviewed following labs and imaging studies:  Labs: Basic Metabolic Panel:  Recent Labs Lab 12/01/15 0415 12/02/15 0339 12/06/15 1436 12/07/15 0413  NA 139 135 135 136  K 4.3 4.1 4.0 3.7  CL 107 102 104 112*  CO2 25 26 23  20*  GLUCOSE 136* 135* 142* 105*  BUN 31* 34* 39* 24*  CREATININE 0.84 0.87 0.97 0.71  CALCIUM 8.6* 8.3* 8.2* 6.8*   GFR Estimated Creatinine Clearance: 99 mL/min (by C-G formula based on SCr of 0.8 mg/dL). Liver Function Tests:  Recent Labs Lab 12/06/15 1436  AST 34  ALT 30  ALKPHOS 95  BILITOT 1.2  PROT 6.1*  ALBUMIN 2.6*   Coagulation profile  Recent Labs Lab 12/06/15 1800  INR 1.17    CBC:  Recent Labs Lab 12/02/15 0339 12/06/15 1436 12/07/15 0413  WBC 22.2* 20.3* 13.9*  NEUTROABS  --  17.5*  --   HGB 14.9 15.7 12.3*  HCT 42.4 44.6 36.0*  MCV 89.3 88.0 89.6  PLT 186 115* 83*   CBG:  Recent Labs Lab 12/04/15 1221 12/04/15 1757 12/04/15 2153 12/05/15 0750 12/05/15 1142  GLUCAP 173* 142* 170* 129* 150*   Thyroid function studies:  Recent Labs  12/06/15 1800  TSH 0.423   Sepsis Labs:  Recent Labs Lab 12/02/15 0339 12/06/15 1436 12/06/15 1455 12/06/15 1800 12/07/15 0413  WBC 22.2* 20.3*  --   --  13.9*  LATICACIDVEN  --   --  2.24* 1.6  --     Microbiology Recent Results (from the past 240 hour(s))  Blood Culture (routine x 2)     Status: None (Preliminary result)   Collection Time: 12/06/15  2:37 PM  Result Value Ref Range Status   Specimen Description BLOOD RIGHT HAND  Final   Special Requests BOTTLES DRAWN AEROBIC AND ANAEROBIC 5 CC EA  Final    Culture  Setup Time   Final    GRAM POSITIVE COCCI IN CHAINS IN PAIRS IN BOTH AEROBIC AND ANAEROBIC BOTTLES Organism ID to follow CRITICAL RESULT CALLED TO, READ BACK BY AND VERIFIED WITHElenore Paddy PHARMD [id number] 12/07/15 A BROWNING Performed at [facility]    Culture PENDING  Incomplete   Report Status PENDING  Incomplete  Blood Culture ID Panel (Reflexed)     Status: Abnormal   Collection Time: 12/06/15  2:37 PM  Result Value Ref Range Status   Enterococcus species DETECTED (A) NOT DETECTED Final    Comment: CRITICAL RESULT CALLED TO, READ BACK BY AND VERIFIED WITH: L POINDEXTER PHARMD [id number] 12/07/15 A BROWNING    Vancomycin resistance NOT DETECTED NOT DETECTED Final   Listeria monocytogenes NOT DETECTED NOT DETECTED  Final   Staphylococcus species NOT DETECTED NOT DETECTED Final   Staphylococcus aureus NOT DETECTED NOT DETECTED Final   Methicillin resistance NOT DETECTED NOT DETECTED Final   Streptococcus species NOT DETECTED NOT DETECTED Final   Streptococcus agalactiae NOT DETECTED NOT DETECTED Final   Streptococcus pneumoniae NOT DETECTED NOT DETECTED Final   Streptococcus pyogenes NOT DETECTED NOT DETECTED Final   Acinetobacter baumannii NOT DETECTED NOT DETECTED Final   Enterobacteriaceae species NOT DETECTED NOT DETECTED Final   Enterobacter cloacae complex NOT DETECTED NOT DETECTED Final   Escherichia coli NOT DETECTED NOT DETECTED Final   Klebsiella oxytoca NOT DETECTED NOT DETECTED Final   Klebsiella pneumoniae NOT DETECTED NOT DETECTED Final   Proteus species NOT DETECTED NOT DETECTED Final   Serratia marcescens NOT DETECTED NOT DETECTED Final   Carbapenem resistance NOT DETECTED NOT DETECTED Final   Haemophilus influenzae NOT DETECTED NOT DETECTED Final   Neisseria meningitidis NOT DETECTED NOT DETECTED Final   Pseudomonas aeruginosa NOT DETECTED NOT DETECTED Final   Candida albicans NOT DETECTED NOT DETECTED Final   Candida glabrata NOT DETECTED NOT DETECTED  Final   Candida krusei NOT DETECTED NOT DETECTED Final   Candida parapsilosis NOT DETECTED NOT DETECTED Final   Candida tropicalis NOT DETECTED NOT DETECTED Final    Comment: Performed at [facility]  MRSA PCR Screening     Status: None   Collection Time: 12/06/15  5:30 PM  Result Value Ref Range Status   MRSA by PCR NEGATIVE NEGATIVE Final    Comment:        The GeneXpert MRSA Assay (FDA approved for NASAL specimens only), is one component of a comprehensive MRSA colonization surveillance program. It is not intended to diagnose MRSA infection nor to guide or monitor treatment for MRSA infections.     Radiology: Dg Chest 2 View  Result Date: 12/06/2015 CLINICAL DATA:  Fever; possible sepsis; chemo yesterday for brain ca EXAM: CHEST  2 VIEW COMPARISON:  Chest CT, 11/27/2015 FINDINGS: Left upper lobe mass without significant change from the prior study. Remainder of the lungs is clear. No pleural effusion or pneumothorax. Cardiac silhouette is normal in size. No mediastinal or hilar masses or evidence of adenopathy. Bony thorax is intact. IMPRESSION: No acute cardiopulmonary disease. Electronically Signed   By: Lajean Manes M.D.   On: 12/06/2015 15:07    Medications:   . aspirin  81 mg Oral Daily  . atorvastatin  80 mg Oral QPM  . feeding supplement (ENSURE ENLIVE)  237 mL Oral TID BM  . hydrocortisone sod succinate (SOLU-CORTEF) inj  100 mg Intravenous Q8H  . levETIRAcetam  750 mg Intravenous Q12H  . pantoprazole  40 mg Oral Daily  . piperacillin-tazobactam (ZOSYN)  IV  3.375 g Intravenous Q8H  . sodium chloride flush  3 mL Intravenous Q12H  . tamsulosin  0.4 mg Oral Daily  . vancomycin  750 mg Intravenous Q8H   Continuous Infusions: . sodium chloride 125 mL/hr at 12/06/15 1707    Time spent: 35 minutes.  The patient is medically complex with multiple co-morbidities and is at high risk for clinical deterioration and requires high complexity decision making.     LOS: 1 day   Ariz Terrones  [facility] Pager [phone]. If unable to reach me by pager, please call my cell phone at [phone].  *Please refer to [email or web address], password [email or web address] to get updated schedule on who will round on this patient, as hospitalists switch teams weekly. If 7PM-7AM, please contact night-coverage  at [email or web address], password [email or web address] for any overnight needs.  12/07/2015, 7:31 AM

## 2015-12-07 NOTE — Consult Note (Signed)
Name: Chad Ball is a 69 y.o. male Admit date: 12/06/2015 Referring Physician:  Cornelious Tommy Ball Primary Physician:  Chad Ball, M.D. Primary Cardiologist:  Chad Ball  Reason for Consultation:  Bacteremia and bradycardia  ASSESSMENT: 1. Marked sinus bradycardia with hypotension of uncertain etiology. Rule out relationship to increased intracranial pressure. Rule out myocardial ischemia related. Bradycardia associated with endocarditis is usually AV block due to valve ring abscess. No evidence of AV block is identified on review of all alarms stored in the telemetry system. 2. Enterococcal bacteremia, rule out endocarditis 3. Melanoma presumed to be widely metastatic with multiple lesions noted in the brain 4. Ascending aortic aneurysm with accompanying aortic atherosclerosis.   PLAN: 1. Serial cardiac markers to rule out myocardial infarction. 2. Treatment for potential intracranial hypertension which is likely causing the patient's profound bradycardia. 3. We will arrange for transesophageal echo to be performed on Monday or Tuesday. 4. Awaiting results of transthoracic echo performed today. 5. Symptomatic therapy of bradycardia if associated with hypotension with systolic blood pressures less than 90 mmHg. Atropine and volume expansion would be treatments of choice.   HPI: 70 year old gentleman with recent diagnosis of melanoma noted on the skin lesion on the abdomen. Diagnosis was made in [place]. He was admitted to Hospital care with profound weakness and confusion. Neurological evaluation identified multiple brain lesions that were felt to be metastatic. Initial options considered for treatment included surgery versus radiation. Final decision for radiation therapy was made and started here at [facility] on 12/05/2015.  He was discharged to a skilled nursing facility and developed fever. He was readmitted and subsequent blood cultures have grown gram negative rods in 2  of 2 bottles. Infectious disease consultant is concerned about the possibility of endocarditis. Also concerned that the brain lesions could represent metastatic infectious process.  PMH:   Past Medical History:  Diagnosis Date  . Arthritis   . Brain metastasis ([place]) 11/24/2015  . CAD in native artery 09/02/2015  . Essential hypertension 09/02/2015  . GERD (gastroesophageal reflux disease)   . Hyperlipidemia 09/02/2015  . Liver lesion   . Lung nodule   . Metastatic melanoma ([place])   . Myocardial infarction [facility])     PSH:   Past Surgical History:  Procedure Laterality Date  . APPENDECTOMY    . HERNIA REPAIR    . RADIOLOGY WITH ANESTHESIA N/A 11/26/2015   Procedure: MRI BRAIN WITH WITH AND WITHOUT CONTRAST;  Surgeon: Medication Radiologist, MD;  Location: [facility];  Service: Radiology;  Laterality: N/A;   Allergies:  Food Prior to Admit Meds:   Prescriptions Prior to Admission  Medication Sig Dispense Refill Last Dose  . acetaminophen (TYLENOL) 325 MG tablet Take 650 mg by mouth every 6 (six) hours as needed for moderate pain or headache.    Past Month at Unknown time  . aspirin 81 MG chewable tablet Chew 81 mg by mouth daily.   12/06/2015 at 0900  . atorvastatin (LIPITOR) 80 MG tablet Take 80 mg by mouth every evening.    12/05/2015 at Unknown time  . carvedilol (COREG) 3.125 MG tablet Take 1 tablet (3.125 mg total) by mouth 2 (two) times daily with a meal.   12/06/2015 at 0900  . dexamethasone (DECADRON) 4 MG tablet Take 4 mg by mouth 4 (four) times daily.   12/06/2015 at Unknown time  . feeding supplement, ENSURE ENLIVE, (ENSURE ENLIVE) LIQD Take 237 mLs by mouth 3 (three) times daily between meals.   12/06/2015 at  Unknown time  . levETIRAcetam (KEPPRA) 750 MG tablet Take 1 tablet (750 mg total) by mouth 2 (two) times daily.   12/06/2015 at Unknown time  . losartan (COZAAR) 25 MG tablet Take 12.5 mg by mouth daily.    12/06/2015 at Unknown time  . Multiple Vitamin (MULTIVITAMIN WITH MINERALS) TABS  tablet Take 1 tablet by mouth daily.   12/06/2015 at Unknown time  . nitroGLYCERIN (NITROSTAT) 0.4 MG SL tablet Place 0.4 mg under the tongue every 5 (five) minutes as needed for chest pain.   Past Month at Unknown time  . omeprazole (PRILOSEC) 20 MG capsule Take 20 mg by mouth daily before breakfast.    12/06/2015 at Unknown time  . oxyCODONE (OXY IR/ROXICODONE) 5 MG immediate release tablet Take 5-10 mg by mouth every 4 (four) hours as needed for moderate pain or severe pain.   12/06/2015 at 1114  . polyethylene glycol (MIRALAX / GLYCOLAX) packet Take 17 g by mouth daily.   12/06/2015 at Unknown time  . senna-docusate (SENOKOT-S) 8.6-50 MG tablet Take 1 tablet by mouth daily.   12/06/2015 at Unknown time   Fam HX:    Family History  Problem Relation Age of Onset  . Heart disease Mother   . Hyperlipidemia Mother    Social HX:    Social History   Social History  . Marital status: Married    Spouse name: Chad Ball  . Number of children: 2  . Years of education: N/A   Occupational History  . Not on file.   Social History Main Topics  . Smoking status: Never Smoker  . Smokeless tobacco: Never Used  . Alcohol use Not on file  . Drug use: Unknown  . Sexual activity: No   Other Topics Concern  . Not on file   Social History Narrative  . No narrative on file     Review of Systems: Patient is unable to give a review of systems. He denies headache and vision disturbance. His wife is at the bedside. No particular positive review of system items noted.. All other systems are negative.  Physical Exam: Blood pressure 126/78, pulse (!) 55, temperature 98.1 F (36.7 C), temperature source Oral, resp. rate 16, height 6\' 2"  (1.88 m), weight 177 lb 0.5 oz (80.3 kg), SpO2 100 %. Weight change:    Lying flat without evidence of respiratory distress. Eyes are closed. He gives permission for me to examine him. HEENT exam reveals pupils that are reactive. No jaundice is noted. Neck veins  are flat. No carotid bruits are noted. Chest is clear to auscultation in anterior positions. Cardiac exam reveals no murmur, rub, click, gallop, or diastolic sounds. Abdomen soft and nontender. The biopsy site and resection of melanoma is noted and appears clean without drainage. Extremities reveal no edema. Pulses are equal in the radial and posterior tibial sites bilaterally. Neurological exam reveals patient is lethargic and does not engage in spontaneous conversation. No obvious focal cranial nerve deficits are noted.  Labs: Lab Results  Component Value Date   WBC 13.9 (H) 12/07/2015   HGB 12.3 (L) 12/07/2015   HCT 36.0 (L) 12/07/2015   MCV 89.6 12/07/2015   PLT 83 (L) 12/07/2015    Recent Labs Lab 12/06/15 1436 12/07/15 0413  NA 135 136  K 4.0 3.7  CL 104 112*  CO2 23 20*  BUN 39* 24*  CREATININE 0.97 0.71  CALCIUM 8.2* 6.8*  PROT 6.1*  --   BILITOT 1.2  --  ALKPHOS 95  --   ALT 30  --   AST 34  --   GLUCOSE 142* 105*   No results found for: PTT Lab Results  Component Value Date   INR 1.17 12/06/2015   INR 0.99 11/29/2015   INR 1.04 11/24/2015   Lab Results  Component Value Date   TROPONINI 0.13 ([place]) 12/07/2015    No results found for: CHOL No results found for: HDL No results found for: LDLCALC No results found for: TRIG No results found for: CHOLHDL No results found for: LDLDIRECT    Radiology:  Dg Chest 2 View  Result Date: 12/06/2015 CLINICAL DATA:  Fever; possible sepsis; chemo yesterday for brain ca EXAM: CHEST  2 VIEW COMPARISON:  Chest CT, 11/27/2015 FINDINGS: Left upper lobe mass without significant change from the prior study. Remainder of the lungs is clear. No pleural effusion or pneumothorax. Cardiac silhouette is normal in size. No mediastinal or hilar masses or evidence of adenopathy. Bony thorax is intact. IMPRESSION: No acute cardiopulmonary disease. Electronically Signed   By: Lajean Manes M.D.   On: 12/06/2015 15:07    EKG:  Sinus  rhythm to sinus bradycardia with left axis deviation, possible inferior infarction, anterior T-wave inversion. No acute ST segment changes are noted. Take it from tracing on 12/06/2015 at 1426.    Belva Crome III 12/07/2015 6:13 PM

## 2015-12-07 NOTE — Consult Note (Signed)
Date of Admission:  12/06/2015  Date of Consult:  12/07/2015  Reason for Consult: Enterococcal bacteremia and possible endocarditis Referring Physician: "Terrilyn Saver auto consult" and Dr. Rockne Menghini   HPI: Chad Ball is an 70 y.o. male with hx of HTN, CAD, Appendectomy, and melanoma recently admitted to [facility] in [place] with profound weakness and confusion. Imaging there had disclosed mx lesions thought to be mets and he was trasnfered to [facility] for [facility] evaluation.   Initially, surgery was pursued, however was decided that radiation plus or minus medical oncology management would better suit patient's needs as surgery would be high risk. Patient was eventually transferred to [facility] for continued management and started radiation on 12/05/2015. He then was DC to SNF but returned within 12 hours with fevers, encephalopathy. He was admitted to [facility] and thought to have UTI due to pyuria. Since then he has grown GNR in urine but his blood cultures 2/2 are growing Enterococcus (not VAN R).   Since arrival to the ICU he has been suffering from significant bradyarrhythmias requiring atropine.  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 In fact I think it is very much reconsidering whether the lesions in the brain DO in fact represent mets or whether they could be a shower of emboli from left sided endocarditis.   The bradyarrhythmias further highlight concern for endocarditis.       Past Medical History:  Diagnosis Date  . Arthritis   . Brain metastasis ([place]) 11/24/2015  . CAD in native artery 09/02/2015  . Essential hypertension 09/02/2015  . GERD (gastroesophageal reflux disease)   . Hyperlipidemia 09/02/2015  . Liver lesion   . Lung nodule   . Metastatic melanoma ([place])   . Myocardial infarction [facility])     Past Surgical History:  Procedure  Laterality Date  . APPENDECTOMY    . HERNIA REPAIR    . RADIOLOGY WITH ANESTHESIA N/A 11/26/2015   Procedure: MRI BRAIN WITH WITH AND WITHOUT CONTRAST;  Surgeon: Medication Radiologist, MD;  Location: [place];  Service: Radiology;  Laterality: N/A;    Social History:  reports that he has never smoked. He has never used smokeless tobacco. His alcohol and drug histories are not on file.   Family History  Problem Relation Age of Onset  . Heart disease Mother   . Hyperlipidemia Mother     Allergies  Allergen Reactions  . Food Anaphylaxis and Other (See Comments)    Pt is allergic to [place] nuts.      Medications: I have reviewed patients current medications as documented in Epic Anti-infectives    Start     Dose/Rate Route Frequency Ordered Stop   12/07/15 1800  Ampicillin-Sulbactam (UNASYN) 3 g in sodium chloride 0.9 % 100 mL IVPB  Status:  Discontinued     3 g 100 mL/hr over 60 Minutes Intravenous Every 6 hours 12/07/15 1548 12/07/15 1553   12/07/15 1700  gentamicin (GARAMYCIN) 560 mg in dextrose 5 % 100 mL IVPB     7 mg/kg  80.3 kg 114 mL/hr over 60 Minutes Intravenous Every 24 hours 12/07/15 1558     12/07/15 1630  ampicillin (OMNIPEN) 2 g in sodium chloride 0.9 % 50 mL IVPB     2 g 150 mL/hr over 20 Minutes Intravenous Every 4 hours 12/07/15 1558     12/07/15  1600  ampicillin (OMNIPEN) 2 g in sodium chloride 0.9 % 50 mL IVPB  Status:  Discontinued     2 g 150 mL/hr over 20 Minutes Intravenous Every 4 hours 12/07/15 1543 12/07/15 1547   12/07/15 0930  ampicillin (OMNIPEN) 2 g in sodium chloride 0.9 % 50 mL IVPB  Status:  Discontinued     2 g 150 mL/hr over 20 Minutes Intravenous Every 4 hours 12/07/15 0906 12/07/15 1525   12/06/15 2200  vancomycin (VANCOCIN) IVPB 750 mg/150 ml premix  Status:  Discontinued     750 mg 150 mL/hr over 60 Minutes Intravenous Every 8 hours 12/06/15 1535 12/07/15 0906   12/06/15 2200  piperacillin-tazobactam (ZOSYN) IVPB 3.375 g  Status:   Discontinued     3.375 g 12.5 mL/hr over 240 Minutes Intravenous Every 8 hours 12/06/15 1535 12/07/15 0906   12/06/15 1415  piperacillin-tazobactam (ZOSYN) IVPB 3.375 g     3.375 g 100 mL/hr over 30 Minutes Intravenous  Once 12/06/15 1413 12/06/15 1551   12/06/15 1415  vancomycin (VANCOCIN) IVPB 1000 mg/200 mL premix     1,000 mg 200 mL/hr over 60 Minutes Intravenous  Once 12/06/15 1413 12/06/15 1619         ROS: as in HPI otherwise remainder of 12 point Review of Systems is not obtainable due to patient's confusion   Blood pressure 123/65, pulse (!) 47, temperature 97.6 F (36.4 C), temperature source Oral, resp. rate (!) 21, height _0  (1.88 m), weight 177 lb 0.5 oz (80.3 kg), SpO2 99 %. General: Alert and awake, oriented to person but confused HEENT: anicteric sclera,  EOMI, oropharynx clear and without exudate Cardiovascular: bradycardic , normal r,  no murmur rubs or gallops Pulmonary: clear to auscultation bilaterally, no wheezing, rales or rhonchi Gastrointestinal: soft nontender, nondistended, normal bowel sounds, Musculoskeletal: no  clubbing or edema noted bilaterally Skin, soft tissue: no rashes Neuro: nonfocal, strength and sensation intact   Results for orders placed or performed during the hospital encounter of 12/06/15 (from the past 48 hour(s))  Blood Culture (routine x 2)     Status: None (Preliminary result)   Collection Time: 12/06/15  1:33 PM  Result Value Ref Range   Specimen Description BLOOD LEFT HAND    Special Requests BOTTLES DRAWN AEROBIC AND ANAEROBIC 5CC    Culture  Setup Time      GRAM POSITIVE COCCI IN CHAINS IN PAIRS IN BOTH AEROBIC AND ANAEROBIC BOTTLES CRITICAL RESULT CALLED TO, READ BACK BY AND VERIFIED WITHElenore Paddy PHARMD [id number] 12/07/15 A BROWNING Performed at [place]    Report Status PENDING   Urinalysis, Routine w reflex microscopic (not at [facility])     Status: Abnormal   Collection Time:  12/06/15  2:00 PM  Result Value Ref Range   Color, Urine AMBER (A) YELLOW    Comment: BIOCHEMICALS MAY BE AFFECTED BY COLOR   APPearance CLOUDY (A) CLEAR   Specific Gravity, Urine 1.036 (H) 1.005 - 1.030   pH 5.5 5.0 - 8.0   Glucose, UA NEGATIVE NEGATIVE mg/dL   Hgb urine dipstick LARGE (A) NEGATIVE   Bilirubin Urine SMALL (A) NEGATIVE   Ketones, ur NEGATIVE NEGATIVE mg/dL   Protein, ur 30 (A) NEGATIVE mg/dL   Nitrite NEGATIVE NEGATIVE   Leukocytes, UA SMALL (A) NEGATIVE  Urine culture     Status: Abnormal (Preliminary result)   Collection Time: 12/06/15  2:00 PM  Result Value Ref Range  Specimen Description URINE, CLEAN CATCH    Special Requests NONE    Culture >=100,000 COLONIES/mL GRAM NEGATIVE RODS (A)    Report Status PENDING   Urine microscopic-add on     Status: Abnormal   Collection Time: 12/06/15  2:00 PM  Result Value Ref Range   Squamous Epithelial / LPF 6-30 (A) NONE SEEN   WBC, UA 6-30 0 - 5 WBC/hpf   RBC / HPF TOO NUMEROUS TO COUNT 0 - 5 RBC/hpf   Bacteria, UA MANY (A) NONE SEEN  Comprehensive metabolic panel     Status: Abnormal   Collection Time: 12/06/15  2:36 PM  Result Value Ref Range   Sodium 135 135 - 145 mmol/L   Potassium 4.0 3.5 - 5.1 mmol/L   Chloride 104 101 - 111 mmol/L   CO2 23 22 - 32 mmol/L   Glucose, Bld 142 (H) 65 - 99 mg/dL   BUN 39 (H) 6 - 20 mg/dL   Creatinine, Ser 0.97 0.61 - 1.24 mg/dL   Calcium 8.2 (L) 8.9 - 10.3 mg/dL   Total Protein 6.1 (L) 6.5 - 8.1 g/dL   Albumin 2.6 (L) 3.5 - 5.0 g/dL   AST 34 15 - 41 U/L   ALT 30 17 - 63 U/L   Alkaline Phosphatase 95 38 - 126 U/L   Total Bilirubin 1.2 0.3 - 1.2 mg/dL   GFR calc non Af Amer >60 >60 mL/min   GFR calc Af Amer >60 >60 mL/min    Comment: (NOTE) The eGFR has been calculated using the CKD EPI equation. This calculation has not been validated in all clinical situations. eGFR's persistently <60 mL/min signify possible Chronic Kidney Disease.    Anion gap 8 5 - 15  CBC WITH  DIFFERENTIAL     Status: Abnormal   Collection Time: 12/06/15  2:36 PM  Result Value Ref Range   WBC 20.3 (H) 4.0 - 10.5 K/uL   RBC 5.07 4.22 - 5.81 MIL/uL   Hemoglobin 15.7 13.0 - 17.0 g/dL   HCT 44.6 39.0 - 52.0 %   MCV 88.0 78.0 - 100.0 fL   MCH 31.0 26.0 - 34.0 pg   MCHC 35.2 30.0 - 36.0 g/dL   RDW 13.7 11.5 - 15.5 %   Platelets 115 (L) 150 - 400 K/uL    Comment: REPEATED TO VERIFY SPECIMEN CHECKED FOR CLOTS PLATELET COUNT CONFIRMED BY SMEAR    Neutrophils Relative % 86 %   Lymphocytes Relative 3 %   Monocytes Relative 11 %   Eosinophils Relative 0 %   Basophils Relative 0 %   Neutro Abs 17.5 (H) 1.7 - 7.7 K/uL   Lymphs Abs 0.6 (L) 0.7 - 4.0 K/uL   Monocytes Absolute 2.2 (H) 0.1 - 1.0 K/uL   Eosinophils Absolute 0.0 0.0 - 0.7 K/uL   Basophils Absolute 0.0 0.0 - 0.1 K/uL   Smear Review MORPHOLOGY UNREMARKABLE   Blood Culture (routine x 2)     Status: None (Preliminary result)   Collection Time: 12/06/15  2:37 PM  Result Value Ref Range   Specimen Description BLOOD RIGHT HAND    Special Requests BOTTLES DRAWN AEROBIC AND ANAEROBIC 5 CC EA    Culture  Setup Time      GRAM POSITIVE COCCI IN CHAINS IN PAIRS IN BOTH AEROBIC AND ANAEROBIC BOTTLES Organism ID to follow CRITICAL RESULT CALLED TO, READ BACK BY AND VERIFIED WITHElenore Paddy PHARMD [id number] 12/07/15 A BROWNING Performed at [facility]  Culture GRAM POSITIVE COCCI    Report Status PENDING   Blood Culture ID Panel (Reflexed)     Status: Abnormal   Collection Time: 12/06/15  2:37 PM  Result Value Ref Range   Enterococcus species DETECTED (A) NOT DETECTED    Comment: CRITICAL RESULT CALLED TO, READ BACK BY AND VERIFIED WITH: L POINDEXTER PHARMD [id number] 12/07/15 A BROWNING    Vancomycin resistance NOT DETECTED NOT DETECTED   Listeria monocytogenes NOT DETECTED NOT DETECTED   Staphylococcus species NOT DETECTED NOT DETECTED   Staphylococcus aureus NOT DETECTED NOT DETECTED   Methicillin resistance NOT  DETECTED NOT DETECTED   Streptococcus species NOT DETECTED NOT DETECTED   Streptococcus agalactiae NOT DETECTED NOT DETECTED   Streptococcus pneumoniae NOT DETECTED NOT DETECTED   Streptococcus pyogenes NOT DETECTED NOT DETECTED   Acinetobacter baumannii NOT DETECTED NOT DETECTED   Enterobacteriaceae species NOT DETECTED NOT DETECTED   Enterobacter cloacae complex NOT DETECTED NOT DETECTED   Escherichia coli NOT DETECTED NOT DETECTED   Klebsiella oxytoca NOT DETECTED NOT DETECTED   Klebsiella pneumoniae NOT DETECTED NOT DETECTED   Proteus species NOT DETECTED NOT DETECTED   Serratia marcescens NOT DETECTED NOT DETECTED   Carbapenem resistance NOT DETECTED NOT DETECTED   Haemophilus influenzae NOT DETECTED NOT DETECTED   Neisseria meningitidis NOT DETECTED NOT DETECTED   Pseudomonas aeruginosa NOT DETECTED NOT DETECTED   Candida albicans NOT DETECTED NOT DETECTED   Candida glabrata NOT DETECTED NOT DETECTED   Candida krusei NOT DETECTED NOT DETECTED   Candida parapsilosis NOT DETECTED NOT DETECTED   Candida tropicalis NOT DETECTED NOT DETECTED    Comment: Performed at [facility]  I-Stat CG4 Lactic Acid, ED  (not at  [facility])     Status: Abnormal   Collection Time: 12/06/15  2:55 PM  Result Value Ref Range   Lactic Acid, Venous 2.24 (HH) 0.5 - 1.9 mmol/L   Comment NOTIFIED PHYSICIAN   MRSA PCR Screening     Status: None   Collection Time: 12/06/15  5:30 PM  Result Value Ref Range   MRSA by PCR NEGATIVE NEGATIVE    Comment:        The GeneXpert MRSA Assay (FDA approved for NASAL specimens only), is one component of a comprehensive MRSA colonization surveillance program. It is not intended to diagnose MRSA infection nor to guide or monitor treatment for MRSA infections.   Protime-INR     Status: None   Collection Time: 12/06/15  6:00 PM  Result Value Ref Range   Prothrombin Time 15.0 11.4 - 15.2 seconds   INR 1.17   TSH     Status: None   Collection Time: 12/06/15   6:00 PM  Result Value Ref Range   TSH 0.423 0.350 - 4.500 uIU/mL  Lactic acid, plasma     Status: None   Collection Time: 12/06/15  6:00 PM  Result Value Ref Range   Lactic Acid, Venous 1.6 0.5 - 1.9 mmol/L  Basic metabolic panel     Status: Abnormal   Collection Time: 12/07/15  4:13 AM  Result Value Ref Range   Sodium 136 135 - 145 mmol/L   Potassium 3.7 3.5 - 5.1 mmol/L   Chloride 112 (H) 101 - 111 mmol/L   CO2 20 (L) 22 - 32 mmol/L   Glucose, Bld 105 (H) 65 - 99 mg/dL   BUN 24 (H) 6 - 20 mg/dL   Creatinine, Ser 0.71 0.61 - 1.24 mg/dL   Calcium 6.8 (L) 8.9 -  10.3 mg/dL   GFR calc non Af Amer >60 >60 mL/min   GFR calc Af Amer >60 >60 mL/min    Comment: (NOTE) The eGFR has been calculated using the CKD EPI equation. This calculation has not been validated in all clinical situations. eGFR's persistently <60 mL/min signify possible Chronic Kidney Disease.    Anion gap 4 (L) 5 - 15  CBC     Status: Abnormal   Collection Time: 12/07/15  4:13 AM  Result Value Ref Range   WBC 13.9 (H) 4.0 - 10.5 K/uL   RBC 4.02 (L) 4.22 - 5.81 MIL/uL   Hemoglobin 12.3 (L) 13.0 - 17.0 g/dL    Comment: DELTA CHECK NOTED REPEATED TO VERIFY    HCT 36.0 (L) 39.0 - 52.0 %   MCV 89.6 78.0 - 100.0 fL   MCH 30.6 26.0 - 34.0 pg   MCHC 34.2 30.0 - 36.0 g/dL   RDW 14.1 11.5 - 15.5 %   Platelets 83 (L) 150 - 400 K/uL    Comment: CONSISTENT WITH PREVIOUS RESULT  Culture, blood (Routine X 2) w Reflex to ID Panel     Status: None (Preliminary result)   Collection Time: 12/07/15 10:35 AM  Result Value Ref Range   Specimen Description      BLOOD LEFT HAND Performed at [facility]    Special Requests Immunocompromised    Culture PENDING    Report Status PENDING   TSH     Status: Abnormal   Collection Time: 12/07/15 11:47 AM  Result Value Ref Range   TSH 0.228 (L) 0.350 - 4.500 uIU/mL  Phosphorus     Status: None   Collection Time: 12/07/15 11:47 AM  Result Value Ref Range   Phosphorus 2.9  2.5 - 4.6 mg/dL  Troponin I (q 6hr x 3)     Status: Abnormal   Collection Time: 12/07/15 11:47 AM  Result Value Ref Range   Troponin I 0.13 (HH) <0.03 ng/mL    Comment: CRITICAL RESULT CALLED TO, READ BACK BY AND VERIFIED WITH: ARNOLD,A AT 12:30PM ON 12/07/15 BY FESTERMAN,C   Magnesium     Status: Abnormal   Collection Time: 12/07/15 11:47 AM  Result Value Ref Range   Magnesium 1.6 (L) 1.7 - 2.4 mg/dL   _0 (sdes,specrequest,cult,reptstatus)   ) Recent Results (from the past 720 hour(s))  MRSA PCR Screening     Status: None   Collection Time: 11/24/15  4:23 PM  Result Value Ref Range Status   MRSA by PCR NEGATIVE NEGATIVE Final    Comment:        The GeneXpert MRSA Assay (FDA approved for NASAL specimens only), is one component of a comprehensive MRSA colonization surveillance program. It is not intended to diagnose MRSA infection nor to guide or monitor treatment for MRSA infections.   Culture, blood (routine x 2)     Status: None   Collection Time: 11/26/15  5:18 PM  Result Value Ref Range Status   Specimen Description BLOOD LEFT ANTECUBITAL  Final   Special Requests IN PEDIATRIC BOTTLE 2CC  Final   Culture NO GROWTH 5 DAYS  Final   Report Status 12/01/2015 FINAL  Final  Culture, blood (routine x 2)     Status: None   Collection Time: 11/26/15  5:20 PM  Result Value Ref Range Status   Specimen Description BLOOD RIGHT HAND  Final   Special Requests BOTTLES DRAWN AEROBIC AND ANAEROBIC 5CC  Final   Culture NO GROWTH 5 DAYS  Final   Report Status 12/01/2015 FINAL  Final  Blood Culture (routine x 2)     Status: None (Preliminary result)   Collection Time: 12/06/15  1:33 PM  Result Value Ref Range Status   Specimen Description BLOOD LEFT HAND  Final   Special Requests BOTTLES DRAWN AEROBIC AND ANAEROBIC 5CC  Final   Culture  Setup Time   Final    GRAM POSITIVE COCCI IN CHAINS IN PAIRS IN BOTH AEROBIC AND ANAEROBIC BOTTLES CRITICAL RESULT CALLED TO, READ  BACK BY AND VERIFIED WITHElenore Paddy PHARMD [id number] 12/07/15 A BROWNING Performed at [place]  Final   Report Status PENDING  Incomplete  Urine culture     Status: Abnormal (Preliminary result)   Collection Time: 12/06/15  2:00 PM  Result Value Ref Range Status   Specimen Description URINE, CLEAN CATCH  Final   Special Requests NONE  Final   Culture >=100,000 COLONIES/mL GRAM NEGATIVE RODS (A)  Final   Report Status PENDING  Incomplete  Blood Culture (routine x 2)     Status: None (Preliminary result)   Collection Time: 12/06/15  2:37 PM  Result Value Ref Range Status   Specimen Description BLOOD RIGHT HAND  Final   Special Requests BOTTLES DRAWN AEROBIC AND ANAEROBIC 5 CC EA  Final   Culture  Setup Time   Final    GRAM POSITIVE COCCI IN CHAINS IN PAIRS IN BOTH AEROBIC AND ANAEROBIC BOTTLES Organism ID to follow CRITICAL RESULT CALLED TO, READ BACK BY AND VERIFIED WITHElenore Paddy PHARMD [id number] 12/07/15 A BROWNING Performed at [place]  Final   Report Status PENDING  Incomplete  Blood Culture ID Panel (Reflexed)     Status: Abnormal   Collection Time: 12/06/15  2:37 PM  Result Value Ref Range Status   Enterococcus species DETECTED (A) NOT DETECTED Final    Comment: CRITICAL RESULT CALLED TO, READ BACK BY AND VERIFIED WITH: L POINDEXTER PHARMD [id number] 12/07/15 A BROWNING    Vancomycin resistance NOT DETECTED NOT DETECTED Final   Listeria monocytogenes NOT DETECTED NOT DETECTED Final   Staphylococcus species NOT DETECTED NOT DETECTED Final   Staphylococcus aureus NOT DETECTED NOT DETECTED Final   Methicillin resistance NOT DETECTED NOT DETECTED Final   Streptococcus species NOT DETECTED NOT DETECTED Final   Streptococcus agalactiae NOT DETECTED NOT DETECTED Final   Streptococcus pneumoniae NOT DETECTED NOT DETECTED Final   Streptococcus pyogenes NOT DETECTED NOT DETECTED Final   Acinetobacter  baumannii NOT DETECTED NOT DETECTED Final   Enterobacteriaceae species NOT DETECTED NOT DETECTED Final   Enterobacter cloacae complex NOT DETECTED NOT DETECTED Final   Escherichia coli NOT DETECTED NOT DETECTED Final   Klebsiella oxytoca NOT DETECTED NOT DETECTED Final   Klebsiella pneumoniae NOT DETECTED NOT DETECTED Final   Proteus species NOT DETECTED NOT DETECTED Final   Serratia marcescens NOT DETECTED NOT DETECTED Final   Carbapenem resistance NOT DETECTED NOT DETECTED Final   Haemophilus influenzae NOT DETECTED NOT DETECTED Final   Neisseria meningitidis NOT DETECTED NOT DETECTED Final   Pseudomonas aeruginosa NOT DETECTED NOT DETECTED Final   Candida albicans NOT DETECTED NOT DETECTED Final   Candida glabrata NOT DETECTED NOT DETECTED Final   Candida krusei NOT DETECTED NOT DETECTED Final   Candida parapsilosis NOT DETECTED NOT DETECTED Final   Candida tropicalis NOT DETECTED NOT DETECTED Final    Comment: Performed at [facility]  MRSA PCR Screening     Status: None   Collection Time: 12/06/15  5:30 PM  Result Value Ref Range Status   MRSA by PCR NEGATIVE NEGATIVE Final    Comment:        The GeneXpert MRSA Assay (FDA approved for NASAL specimens only), is one component of a comprehensive MRSA colonization surveillance program. It is not intended to diagnose MRSA infection nor to guide or monitor treatment for MRSA infections.   Culture, blood (Routine X 2) w Reflex to ID Panel     Status: None (Preliminary result)   Collection Time: 12/07/15 10:35 AM  Result Value Ref Range Status   Specimen Description   Final    BLOOD LEFT HAND Performed at [facility]    Special Requests Immunocompromised  Final   Culture PENDING  Incomplete   Report Status PENDING  Incomplete     Impression/Recommendation  Principal Problem:   Sepsis due to urinary tract infection ([place]) Active Problems:   Essential hypertension   Hyperlipidemia   CAD in native  artery   Brain metastasis ([facility])   Metastatic melanoma ([facility])   Sepsis (H[facility])   Hyperglycemia   Acute encephalopathy   Hypocalcemia   Hypoalbuminemia   Nonspecific abnormal electrocardiogram (ECG) (EKG)   Chad Ball is a 70 y.o. male with  Recent diagnosis of multiple CNS lesions thought to be metastatic melanoma (but no tissue diagnostic proof) started on XRT and steroids now admitted with sepsis, confusion and found to have Enterococcal bacteremia (discordant with urine cx) bradyarrythmias and in my mind a high likelihood of infectious endocarditis  #1 Enterococcal Bacteremia and likely Endocarditis: I have discussed the case with Dr. Rockne Menghini and Dr. Harl Bowie and in particular need for TEE  I think it would be worthwhile having Neuroradiology review his MRI's esp if we find the patient has large left sided vegetation or left sided endocarditis period.   Clearly if this is endocarditis with septic emboli to the brain rather than metastatic melanoma the management would be quite different.   He could of course have both.   If there is lack of clarity that could bring back idea  Of diagnostic brain biopsy   I spent greater than 80 minutes with the patient including greater than 50% of time in face to face counsel of the patient and his family including his wife, son, niece re IE, enterococcus septic emboli, melanoma and in coordination of his care with [facility] and Cardiology.      12/07/2015, 4:09 PM   Thank you so much for this interesting consult  [place] [facility] [place] [phone] (pager) [phone] (office) 12/07/2015, 4:09 PM  Rhina [place] 12/07/2015, 4:09 PM

## 2015-12-07 NOTE — Progress Notes (Signed)
Pt HR trending down and staying in the mid to low 40's. Lowest rate was 43. BP of 81/45. 1000cc bolus given. MD notified. Will continue to monitor.

## 2015-12-07 NOTE — Progress Notes (Signed)
  Echocardiogram 2D Echocardiogram has been performed.  Chad Ball 12/07/2015, 2:56 PM

## 2015-12-07 NOTE — Progress Notes (Signed)
Pt HR dropped to 39 and has been holding around 40-43. MD notified. Will continue to monitor.

## 2015-12-07 NOTE — Progress Notes (Addendum)
Pharmacy Antibiotic Note  Chad Ball is a 70 y.o. male admitted on 12/06/2015 with  with sepsis. PMH CAD w/ MI s/p stent, arthritis, GERD, and metastatic melanoma; recently admitted 7/30-8/10 for confusion and headaches during which brain mets were identified. Discharged to SNF and started radiation on 8/10   Pharmacy has been consulted for gentamicin dosing for bacteremia.  Plan: Gentamicin 7mg /kg (560mg ) IV q24h Check gentamicin level in 10 hours and adjust accordingly Follow renal function, clinical course   Height: 6\' 2"  (188 cm) Weight: 177 lb 0.5 oz (80.3 kg) IBW/kg (Calculated) : 82.2  Temp (24hrs), Avg:97.4 F (36.3 C), Min:97.1 F (36.2 C), Max:97.7 F (36.5 C)   Recent Labs Lab 12/01/15 0415 12/02/15 0339 12/06/15 1436 12/06/15 1455 12/06/15 1800 12/07/15 0413  WBC  --  22.2* 20.3*  --   --  13.9*  CREATININE 0.84 0.87 0.97  --   --  0.71  LATICACIDVEN  --   --   --  2.24* 1.6  --     Estimated Creatinine Clearance: 99 mL/min (by C-G formula based on SCr of 0.8 mg/dL).    Allergies  Allergen Reactions  . Food Anaphylaxis and Other (See Comments)    Pt is allergic to [place] nuts.     Antimicrobials this admission: Vancomycin 8/11>> Zosyn 8/11>>8/12 Ampicillin 8/12 >>  gentamicin 8/12 >> Dose adjustments this admission:   Microbiology results: 8/11BCx: GPC pairs/chains 2/2 - BCID Enterococcus species - no vanco resistance (organism ID pending) 8/11UCx: sent 8/11 MRSA PCR: neg  Thank you for allowing pharmacy to be a part of this patient's care.  Dolly Rias RPh 12/07/2015, 3:44 PM Pager [phone]

## 2015-12-07 NOTE — Progress Notes (Signed)
Pt HR dropping to 38-39 frequently, sustaining at 40-43. MD notified of slow/steady decline seen in HR as reported overnight and as seen throughout the morning. 1mg  Atropine ordered and given IV. EKG performed. Pt mentation remains at baseline. Pt responded to Atropine. HR now 70. Will continue to monitor.

## 2015-12-07 NOTE — Progress Notes (Signed)
PHARMACY - PHYSICIAN COMMUNICATION CRITICAL VALUE ALERT - BLOOD CULTURE IDENTIFICATION (BCID)  Results for orders placed or performed during the hospital encounter of 12/06/15  Blood Culture ID Panel (Reflexed) (Collected: 12/06/2015  2:37 PM)  Result Value Ref Range   Enterococcus species DETECTED (A) NOT DETECTED   Vancomycin resistance NOT DETECTED NOT DETECTED   Listeria monocytogenes NOT DETECTED NOT DETECTED   Staphylococcus species NOT DETECTED NOT DETECTED   Staphylococcus aureus NOT DETECTED NOT DETECTED   Methicillin resistance NOT DETECTED NOT DETECTED   Streptococcus species NOT DETECTED NOT DETECTED   Streptococcus agalactiae NOT DETECTED NOT DETECTED   Streptococcus pneumoniae NOT DETECTED NOT DETECTED   Streptococcus pyogenes NOT DETECTED NOT DETECTED   Acinetobacter baumannii NOT DETECTED NOT DETECTED   Enterobacteriaceae species NOT DETECTED NOT DETECTED   Enterobacter cloacae complex NOT DETECTED NOT DETECTED   Escherichia coli NOT DETECTED NOT DETECTED   Klebsiella oxytoca NOT DETECTED NOT DETECTED   Klebsiella pneumoniae NOT DETECTED NOT DETECTED   Proteus species NOT DETECTED NOT DETECTED   Serratia marcescens NOT DETECTED NOT DETECTED   Carbapenem resistance NOT DETECTED NOT DETECTED   Haemophilus influenzae NOT DETECTED NOT DETECTED   Neisseria meningitidis NOT DETECTED NOT DETECTED   Pseudomonas aeruginosa NOT DETECTED NOT DETECTED   Candida albicans NOT DETECTED NOT DETECTED   Candida glabrata NOT DETECTED NOT DETECTED   Candida krusei NOT DETECTED NOT DETECTED   Candida parapsilosis NOT DETECTED NOT DETECTED   Candida tropicalis NOT DETECTED NOT DETECTED    Name of physician (or Provider) Contacted: Walden Field, NP  Changes to prescribed antibiotics required: No changes at this time. Continue current regimen. Urine culture also pending.  Everette Rank, PharmD 12/07/2015  5:59 AM

## 2015-12-07 NOTE — Progress Notes (Signed)
CRITICAL VALUE ALERT  Critical value received: Troponin 0.13  Date of notification:  12/07/2015  Time of notification:  [id number]  Critical value read back:Yes.    Nurse who received alert:  A. Roselie Awkward, RN  MD notified (1st page):  Rama  Time of first page:  1237  MD notified (2nd page):  Time of second page:  Responding MD:  Rama  Time MD responded:  [id number]

## 2015-12-08 ENCOUNTER — Inpatient Hospital Stay (HOSPITAL_COMMUNITY): Payer: Medicare Other

## 2015-12-08 DIAGNOSIS — B961 Klebsiella pneumoniae [K. pneumoniae] as the cause of diseases classified elsewhere: Secondary | ICD-10-CM

## 2015-12-08 DIAGNOSIS — A4181 Sepsis due to Enterococcus: Principal | ICD-10-CM

## 2015-12-08 DIAGNOSIS — N390 Urinary tract infection, site not specified: Secondary | ICD-10-CM

## 2015-12-08 DIAGNOSIS — D649 Anemia, unspecified: Secondary | ICD-10-CM | POA: Diagnosis present

## 2015-12-08 DIAGNOSIS — R001 Bradycardia, unspecified: Secondary | ICD-10-CM

## 2015-12-08 DIAGNOSIS — M311 Thrombotic microangiopathy: Secondary | ICD-10-CM

## 2015-12-08 DIAGNOSIS — E876 Hypokalemia: Secondary | ICD-10-CM | POA: Diagnosis present

## 2015-12-08 DIAGNOSIS — C7931 Secondary malignant neoplasm of brain: Secondary | ICD-10-CM

## 2015-12-08 DIAGNOSIS — C799 Secondary malignant neoplasm of unspecified site: Secondary | ICD-10-CM

## 2015-12-08 DIAGNOSIS — G9340 Encephalopathy, unspecified: Secondary | ICD-10-CM

## 2015-12-08 LAB — BASIC METABOLIC PANEL
ANION GAP: 4 — AB (ref 5–15)
Anion gap: 2 — ABNORMAL LOW (ref 5–15)
BUN: 21 mg/dL — ABNORMAL HIGH (ref 6–20)
BUN: 22 mg/dL — AB (ref 6–20)
CALCIUM: 7.1 mg/dL — AB (ref 8.9–10.3)
CHLORIDE: 111 mmol/L (ref 101–111)
CHLORIDE: 111 mmol/L (ref 101–111)
CO2: 22 mmol/L (ref 22–32)
CO2: 22 mmol/L (ref 22–32)
CREATININE: 0.56 mg/dL — AB (ref 0.61–1.24)
Calcium: 7.1 mg/dL — ABNORMAL LOW (ref 8.9–10.3)
Creatinine, Ser: 0.66 mg/dL (ref 0.61–1.24)
GFR calc Af Amer: >60 mL/min (ref >60)
GFR calc non Af Amer: >60 mL/min (ref >60)
GFR calc non Af Amer: >60 mL/min (ref >60)
GLUCOSE: 118 mg/dL — AB (ref 65–99)
GLUCOSE: 99 mg/dL (ref 65–99)
POTASSIUM: 3.4 mmol/L — AB (ref 3.5–5.1)
POTASSIUM: 3.3 mmol/L — AB (ref 3.5–5.1)
SODIUM: 135 mmol/L (ref 135–145)
Sodium: 137 mmol/L (ref 135–145)

## 2015-12-08 LAB — CBC
HEMATOCRIT: 31.2 % — AB (ref 39.0–52.0)
Hemoglobin: 10.8 g/dL — ABNORMAL LOW (ref 13.0–17.0)
MCH: 30.5 pg (ref 26.0–34.0)
MCHC: 34.6 g/dL (ref 30.0–36.0)
MCV: 88.1 fL (ref 78.0–100.0)
PLATELETS: 78 10*3/uL — AB (ref 150–400)
RBC: 3.54 MIL/uL — ABNORMAL LOW (ref 4.22–5.81)
RDW: 13.8 % (ref 11.5–15.5)
WBC: 10.8 10*3/uL — AB (ref 4.0–10.5)

## 2015-12-08 LAB — URINE CULTURE: Culture: >=100000 — AB

## 2015-12-08 LAB — HIV ANTIBODY (ROUTINE TESTING W REFLEX): HIV SCREEN 4TH GENERATION: NONREACTIVE

## 2015-12-08 LAB — TROPONIN I
TROPONIN I: 0.07 ng/mL — AB (ref <0.03)
TROPONIN I: 0.07 ng/mL — AB (ref <0.03)
Troponin I: 0.09 ng/mL (ref <0.03)
Troponin I: 0.07 ng/mL (ref <0.03)

## 2015-12-08 LAB — BRAIN NATRIURETIC PEPTIDE: B Natriuretic Peptide: 270.6 pg/mL — ABNORMAL HIGH (ref 0.0–100.0)

## 2015-12-08 LAB — C-REACTIVE PROTEIN: CRP: 3.8 mg/dL — ABNORMAL HIGH (ref <1.0)

## 2015-12-08 LAB — SEDIMENTATION RATE: Sed Rate: 2 mm/hr (ref 0–16)

## 2015-12-08 LAB — GENTAMICIN LEVEL, RANDOM: GENTAMICIN RM: 4.4 ug/mL

## 2015-12-08 MED ORDER — POTASSIUM CHLORIDE 10 MEQ/100ML IV SOLN
10.0000 meq | INTRAVENOUS | Status: AC
  Administered 2015-12-08 (×4): 10 meq via INTRAVENOUS
  Filled 2015-12-08 (×4): qty 100

## 2015-12-08 MED ORDER — CETYLPYRIDINIUM CHLORIDE 0.05 % MT LIQD
7.0000 mL | Freq: Two times a day (BID) | OROMUCOSAL | Status: DC
  Administered 2015-12-08 – 2015-12-13 (×8): 7 mL via OROMUCOSAL

## 2015-12-08 MED ORDER — DEXAMETHASONE 4 MG PO TABS
4.0000 mg | ORAL_TABLET | Freq: Four times a day (QID) | ORAL | Status: DC
  Administered 2015-12-08 – 2015-12-13 (×18): 4 mg via ORAL
  Filled 2015-12-08 (×4): qty 1
  Filled 2015-12-08 (×2): qty 2
  Filled 2015-12-08: qty 1
  Filled 2015-12-08 (×5): qty 2
  Filled 2015-12-08 (×2): qty 1
  Filled 2015-12-08: qty 2
  Filled 2015-12-08 (×3): qty 1
  Filled 2015-12-08: qty 2
  Filled 2015-12-08: qty 1
  Filled 2015-12-08 (×3): qty 2

## 2015-12-08 MED ORDER — LIP MEDEX EX OINT: TOPICAL_OINTMENT | CUTANEOUS | Status: DC | PRN

## 2015-12-08 MED ORDER — SODIUM CHLORIDE 0.9 % IV SOLN
0.5000 mg/kg/h | INTRAVENOUS | Status: DC
  Administered 2015-12-08 – 2015-12-09 (×2): 0.5 mg/kg/h via INTRAVENOUS
  Filled 2015-12-08 (×2): qty 100

## 2015-12-08 MED ORDER — GADOBENATE DIMEGLUMINE 529 MG/ML IV SOLN
17.0000 mL | Freq: Once | INTRAVENOUS | Status: AC | PRN
  Administered 2015-12-08: 17 mL via INTRAVENOUS

## 2015-12-08 MED ORDER — SODIUM CHLORIDE 0.9 % IV SOLN: INTRAVENOUS | Status: DC

## 2015-12-08 MED ORDER — GENTAMICIN SULFATE 40 MG/ML IJ SOLN
120.0000 mg | Freq: Two times a day (BID) | INTRAVENOUS | Status: DC
  Administered 2015-12-08 – 2015-12-11 (×6): 120 mg via INTRAVENOUS
  Filled 2015-12-08 (×7): qty 3

## 2015-12-08 MED ORDER — SODIUM CHLORIDE 0.9 % IV SOLN
1.0000 g | Freq: Once | INTRAVENOUS | Status: AC
  Administered 2015-12-08: 1 g via INTRAVENOUS
  Filled 2015-12-08: qty 10

## 2015-12-08 NOTE — Consult Note (Addendum)
PULMONARY / CRITICAL CARE MEDICINE   Name: Chad Ball MRN: [id number] DOB: [birth year]    ADMISSION DATE:  12/06/2015 CONSULTATION DATE: 12/08/2015  REFERRING MD:  Dr. Rockne Menghini, [facility]  CHIEF COMPLAINT:  Bradycardia  HISTORY OF PRESENT ILLNESS:   70 yo male with history of melanoma with metastatic brain lesions.  He as in hospital from 11/24/15 to 12/05/15.  He was started on decadron, and was being evaluated by oncology and radiation oncology.  He was d/c to SNF.  On 12/06/15 he was brought back to ER with concern for sepsis.  He had low grade fever, low blood pressure, and altered mental status.  He was started on Abx for UTI.  He was found to have Enterococcal bacteremia, and automatic ID consult was done.  There was concern for septic emboli and endocarditis.  He developed bradycardia.  Cardiology was consulted, and there concern was for increased intracranial pressure causing hypotension and bradycardia.  CT abd/pelvis and MRI liver from recent admission showed evidence concerning for Rt pyelonephritis.  His wife reports that he has problems with his prostate and difficulty passing urine.  He was treated for a bladder infection about 1 month ago, and had foley catheter recently.    She also reports that he has know about "stretched" aorta and leaky heart valve for years.  He still has headache, but mental status is improved as per wife.  He denies chest pain, nausea, abdominal pain, or dyspnea.  He feels tired, and wants to rest.  PAST MEDICAL HISTORY :  He  has a past medical history of Arthritis; Brain metastasis ([place]) (11/24/2015); CAD in native artery (09/02/2015); Essential hypertension (09/02/2015); GERD (gastroesophageal reflux disease); Hyperlipidemia (09/02/2015); Liver lesion; Lung nodule; Metastatic melanoma ([place]); and Myocardial infarction ([place]).  PAST SURGICAL HISTORY: He  has a past surgical history that includes Appendectomy; Hernia repair; and Radiology with anesthesia (N/A,  11/26/2015).  Allergies  Allergen Reactions  . Food Anaphylaxis and Other (See Comments)    Pt is allergic to [place] nuts.     No current facility-administered medications on file prior to encounter.    Current Outpatient Prescriptions on File Prior to Encounter  Medication Sig  . acetaminophen (TYLENOL) 325 MG tablet Take 650 mg by mouth every 6 (six) hours as needed for moderate pain or headache.   Marland Kitchen atorvastatin (LIPITOR) 80 MG tablet Take 80 mg by mouth every evening.   . carvedilol (COREG) 3.125 MG tablet Take 1 tablet (3.125 mg total) by mouth 2 (two) times daily with a meal.  . feeding supplement, ENSURE ENLIVE, (ENSURE ENLIVE) LIQD Take 237 mLs by mouth 3 (three) times daily between meals.  . levETIRAcetam (KEPPRA) 750 MG tablet Take 1 tablet (750 mg total) by mouth 2 (two) times daily.  Marland Kitchen losartan (COZAAR) 25 MG tablet Take 12.5 mg by mouth daily.   . Multiple Vitamin (MULTIVITAMIN WITH MINERALS) TABS tablet Take 1 tablet by mouth daily.  . nitroGLYCERIN (NITROSTAT) 0.4 MG SL tablet Place 0.4 mg under the tongue every 5 (five) minutes as needed for chest pain.  Marland Kitchen omeprazole (PRILOSEC) 20 MG capsule Take 20 mg by mouth daily before breakfast.     FAMILY HISTORY:  His indicated that his mother is alive. He indicated that his father is deceased.    SOCIAL HISTORY: He  reports that he has never smoked. He has never used smokeless tobacco.  REVIEW OF SYSTEMS:   Negative except above.  SUBJECTIVE:  Feels sleepy  VITAL SIGNS: BP Marland Kitchen)  143/70   Pulse (!) 55   Temp 98.7 F (37.1 C) (Oral)   Resp 13   Ht 6\' 2"  (1.88 m)   Wt 181 lb 14.1 oz (82.5 kg)   SpO2 98%   BMI 23.35 kg/m   HEMODYNAMICS:    VENTILATOR SETTINGS:    INTAKE / OUTPUT: I/O last 3 completed shifts: In: 12954.8 [I.V.:2000; IV Piggyback:10954.8] Out: 2075 [Urine:2075]  PHYSICAL EXAMINATION: General: Pleasant Neuro:  Alert, normal strength, moves all extremities, CN intact HEENT:  Pupils reactive,  no stridor Cardiovascular:  Regular, 2/6 SM Lungs:  No wheeze/rales Abdomen:  Soft, non tender Musculoskeletal:  No edema Skin:  No rashes  LABS:  BMET  Recent Labs Lab 12/07/15 0413 12/07/15 2317 12/08/15 0240  NA 136 137 135  K 3.7 3.4* 3.3*  CL 112* 111 111  CO2 20* 22 22  BUN 24* 21* 22*  CREATININE 0.71 0.66 0.56*  GLUCOSE 105* 118* 99    Electrolytes  Recent Labs Lab 12/07/15 0413 12/07/15 1147 12/07/15 2317 12/08/15 0240  CALCIUM 6.8*  --  7.1* 7.1*  MG  --  1.6*  --   --   PHOS  --  2.9  --   --     CBC  Recent Labs Lab 12/06/15 1436 12/07/15 0413 12/08/15 0240  WBC 20.3* 13.9* 10.8*  HGB 15.7 12.3* 10.8*  HCT 44.6 36.0* 31.2*  PLT 115* 83* 78*    Coag's  Recent Labs Lab 12/06/15 1800  INR 1.17    Sepsis Markers  Recent Labs Lab 12/06/15 1455 12/06/15 1800  LATICACIDVEN 2.24* 1.6    ABG No results for input(s): PHART, PCO2ART, PO2ART in the last 168 hours.  Liver Enzymes  Recent Labs Lab 12/06/15 1436  AST 34  ALT 30  ALKPHOS 95  BILITOT 1.2  ALBUMIN 2.6*    Cardiac Enzymes  Recent Labs Lab 12/07/15 1828 12/07/15 2317 12/08/15 0838  TROPONINI 0.12* 0.09* 0.07*    Glucose  Recent Labs Lab 12/04/15 0823 12/04/15 1221 12/04/15 1757 12/04/15 2153 12/05/15 0750 12/05/15 1142  GLUCAP 144* 173* 142* 170* 129* 150*    Imaging No results found.   STUDIES:  8/01 MRI brain >> numerous masses containing blood products or melanin, surrounding vasogenic edema, possible hemorrhage in ventricular system and mild hydrocephalus 8/02 CT chest >> coronary calcifications, 2.4 cm LUL nodule 8/02 CT abd/pelvis >> 2 low attenuation liver lesions, small hiatal hernia, enlarged prostate, striated appearance of Rt kidney 8/08 MRI liver >> liver cysts, Rt renal pyelonephritis, 4.5 cm ascending aortic aneurysm 8/12 TTE >> EF 60 to 65%, mod AR, aortic root 44 mm, ascending aorta 48 mm  CULTURES: 8/11 Blood >> 8/11 Urine  >> Klebsiella pneumoniae 8/12 Blood >>   ANTIBIOTICS: 8/11 Vancomycin >> 8/11 8/11 Zosyn >> 8/11 8/12 Gentamycin >> 8/12 Ampicillin >>   SIGNIFICANT EVENTS: 8/11 Admit  LINES/TUBES:  DISCUSSION: 70 yo male with recent dx of metastatic melanoma with brain metastases.  He has recently been started on decadron, AEDs, and XRT.  He has hx of prostate enlargement and UTI's.  Imaging studies during recent admission showed concern for Rt pyelonephritis.  He returned to hospital from SNF on 8/11 with sepsis, UTI.  He was found to have bacteremia and bradycardia.  He has prior history of ascending aortic aneurysm and aortic regurgitation.  ID is concerned about endocarditis with septic emboli >> MRI brain from 8/01 was more consistent with metastatic melanoma.  Cardiology is concerned about bradycardia being  related to increased intracranial pressure.  This is possible, but improvement in mental status with antibiotics/fluids for sepsis and UTI would seem to speak against significant increased intracranial pressure as cause.  ASSESSMENT / PLAN:  NEUROLOGY A: Acute encephalopathy likely related to sepsis, UTI, bacteremia. Metastatic brain lesions most likely related to melanoma. P: - will arrange for repeat MRI brain with and w/o contrast >> compare to MRI brain from 8/01 to determine if there has been any progression of lesions, bleeding, or hydrocephalus - depending on results of repeat MRI brain, might then need further input from neurology/neurosurgery - continue decadron, keppra  INFECTION A: Sepsis. UTI, Rt pyelonephritis with Klebsiella in urine culture. Enterococcal Bactermia. P: - ABx per ID  CARDIAC A: Hx of ascending aortic aneurysm and aortic regurgitation. Bradycardia. Hx of CAD, HTN, HLD. P: - for TEE with cardiology - monitor hemodynamics - hold ASA for now pending MRI brain results - continue lipitor - hold outpt coreg, cozaar  PULMONARY A: 2.4 cm LUL  nodule >> likely metastatic lesion from melanoma. P: - follow up imaging studies as outpt  RENAL A: Hypokalemia, hypocalcemia, hypomagnesemia. Prostate enlargement. P: - replace electrolytes as needed - continue flomax - might need urology assessment at some point  GASTROENTEROLOGY A: Nutrition. Hx of GERD. P: - soft diet - protonix for SUP  HEMATOLOGY A: Anemia, thrombocytopenia in setting of sepsis. P: - f/u CBC - SCD's for DVT prophylaxis  ENDOCRINOLOGY A: Steroid induced hyperglycemia. P: - SSI   Updated pt's wife at bedside.  D/w Dr. Rockne Menghini.  CC time 38 minutes.  Chesley Mires, MD [facility] Pulmonary/Critical Care 12/08/2015, 11:24 AM Pager:  [phone] After 3pm call: [phone]

## 2015-12-08 NOTE — Progress Notes (Signed)
Progress Note    Chad Ball  [id number] DOB: January 27, 1946  DOA: 12/06/2015 PCP: [name], MD    Brief Narrative:   Chad Ball is an 70 y.o. 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 Workup in the ED was consistent with sepsis from a UTI.  Assessment/Plan:   Principal Problem:   Enterococcal sepsis due to complicated urinary tract infection ([place]) complicated by acute encephalopathy, rule out endocarditis Patient was admitted as inpatient due to intensity of service and high risk for decompensation. He has a chronic indwelling Foley with evidence of urinary retention (bladder scan performed with 100 mL of residual urine noted). Continue Flomax.Blood pressure has improved, lactate has cleared and his WBC count is improving. Broad-spectrum antibiotics with vancomycin/Zosyn were initiated in the ED. His antibiotics were changed to ampicillin and gentamicin by ID. Since he is chronically on Decadron for his brain metastasis, stress dose steroids were initiated as well (resume decadron due to greater CNS bioavailability). Blood cultures are positive for enterococcus. ID recommends TEE to rule out endocarditis. TTE done but was a poor study to rule out vegetations, so TEE planned.    Klebsiella UTI Ampicillin resistant, but sensitive to gentamycin.  Discussed with Dr. Tommy Medal who favors not broadening ampicillin to Unasyn.    Active Problems:   Normocytic anemia Patient has had a form gram drop in his hemoglobin over the past week. Likely represents dilutional changes in the setting of anemia of chronic disease, but we'll continue to  monitor.    Hypokalemia We'll replete.    Hypocalcemia/hypoalbuminemia Calcium 7.92 when corrected for low albumin. Likely from sepsis (was normal on 11/25/15).  Less likely tumor lysis (other electrolytes OK). Phosphorus WNL. No change in calcium despite supplementation. We'll place on continuous drip.    Sinus bradycardia and demand ischemia TSH low, so hypothyroidism not likely the cause of his bradycardia.  Continue to replace calcium as low calcium can be associated with heart rhythm abnormalities. Atropine ordered as needed.  Will get cardiologist to evaluate. Lethargic but otherwise asymptomatic.  12-lead EKG shows anterior T-wave inversions. Troponins elevated with downward trend. Cardiology following with thought that patient's bradycardia might be related to increased intracranial hypertension. Patient remains on stress dose steroids. The patient denied headache. Heart rate 40s-70s. Consider mannitol.    Hyperglycemia Likely steroid-induced. Follow-up hemoglobin A1c.    Essential hypertension Hypotensive on admission, blood pressure improving with IV fluids. Continue IV fluids at 125 mL per hour.    Hyperlipidemia/CAD in native artery/abnormal EKG Continue aspirin and Lipitor. Troponins elevated but trend is down. Cardiology following. ST & T wave abnormalities on EKG worrisome for anterolateral ischemia. 2-D echo did not show any evidence of regional wall motion abnormalities.    Metastatic melanoma (HCC)/brain metastasis/suspected increased intracranial pressure Resume Decadron (better CNS penetration), continue Keppra. Have discussed with neurologist, Dr. Tasia Catchings, (thinks brain lesions are more consistent with metastatic melanoma rather than septic emboli) and Dr. Halford Chessman who will see the patient in consultation.  I have spoke with Dr. Kathyrn Sheriff who feels that the patient's brain masses are MOST consistent with metastasis given the the presence of blood products near the  metastasis,  which is a fairly common feature of melanoma involving the brain. At this point, would continue Decadron and proceed with radiation therapy as planned. Consider palliative care consultation given prognosis.   Family Communication/Anticipated D/C date and plan/Code Status   DVT prophylaxis: SCDs ordered. Code Status: Full Code.  Family Communication: Wife updated at bedside. Disposition Plan: From SNF.   Medical Consultants:    Cardiology  Infectious Disease  Critical Care   Procedures:    None  Anti-Infectives:   Vancomycin 12/06/15--->12/07/15 Zosyn 12/06/15---> 12/07/15 Ampicillin 12/07/15---> Gentamicin 12/07/15--->  Subjective:    Chad Ball is lethargic but opens eyes to voice and answers questions appropriately.  He reports 5/10 headache pain but no nausea, vomiting, chest pain, fever or chills..    Objective:    Vitals:   12/08/15 0700 12/08/15 0800 12/08/15 0801 12/08/15 0900  BP: (!) 162/82 (!) 158/83  (!) 143/70  Pulse: 77 65  (!) 55  Resp: 14 15  13   Temp:   98.7 F (37.1 C)   TempSrc:   Oral   SpO2: 91% 93%  98%  Weight:      Height:        Intake/Output Summary (Last 24 hours) at 12/08/15 1015 Last data filed at 12/08/15 1000  Gross per 24 hour  Intake           2196.5 ml  Output             2000 ml  Net            196.5 ml   Filed Weights   12/06/15 1735 12/07/15 0400 12/08/15 0445  Weight: 76.5 kg (168 lb 10.4 oz) 80.3 kg (177 lb 0.5 oz) 82.5 kg (181 lb 14.1 oz)    Exam: General exam: Appears calm and comfortable, but is a bit lethargic. Respiratory system: Clear to auscultation. Respiratory effort normal. Cardiovascular system: S1 & S2 heard, bradycardic. No JVD,  rubs, gallops or clicks. No murmurs. Gastrointestinal system: Abdomen is nondistended, soft and nontender. No organomegaly or masses felt. Normal bowel sounds heard. Central nervous system: Lethargic. No focal neurological deficits. Extremities: No clubbing,  or  cyanosis. No edema. Skin: No rashes, lesions or ulcers. Psychiatry: Judgement and insight appear normal. Mood & affect appropriate.   Data Reviewed:   I have personally reviewed following labs and imaging studies:  Labs: Basic Metabolic Panel:  Recent Labs Lab 12/02/15 0339 12/06/15 1436 12/07/15 0413 12/07/15 1147 12/07/15 2317 12/08/15 0240  NA 135 135 136  --  137 135  K 4.1 4.0 3.7  --  3.4* 3.3*  CL 102 104 112*  --  111 111  CO2 26 23 20*  --  22 22  GLUCOSE 135* 142* 105*  --  118* 99  BUN 34* 39* 24*  --  21* 22*  CREATININE 0.87 0.97 0.71  --  0.66 0.56*  CALCIUM 8.3* 8.2* 6.8*  --  7.1* 7.1*  MG  --   --   --  1.6*  --   --   PHOS  --   --   --  2.9  --   --    GFR Estimated Creatinine Clearance: 101.3 mL/min (by C-G formula based on SCr of 0.8 mg/dL). Liver Function Tests:  Recent Labs Lab 12/06/15 1436  AST 34  ALT 30  ALKPHOS 95  BILITOT 1.2  PROT 6.1*  ALBUMIN 2.6*   Coagulation profile  Recent Labs Lab 12/06/15 1800  INR 1.17  CBC:  Recent Labs Lab 12/02/15 0339 12/06/15 1436 12/07/15 0413 12/08/15 0240  WBC 22.2* 20.3* 13.9* 10.8*  NEUTROABS  --  17.5*  --   --   HGB 14.9 15.7 12.3* 10.8*  HCT 42.4 44.6 36.0* 31.2*  MCV 89.3 88.0 89.6 88.1  PLT 186 115* 83* 78*   Cardiac Enzymes:  Recent Labs Lab 12/07/15 1147 12/07/15 1828 12/07/15 2317 12/08/15 0838  TROPONINI 0.13* 0.12* 0.09* 0.07*   BNP: Invalid input(s): POCBNP CBG:  Recent Labs Lab 12/04/15 1221 12/04/15 1757 12/04/15 2153 12/05/15 0750 12/05/15 1142  GLUCAP 173* 142* 170* 129* 150*   D-Dimer No results for input(s): DDIMER in the last 72 hours. Hgb A1c No results for input(s): HGBA1C in the last 72 hours. Lipid Profile No results for input(s): CHOL, HDL, LDLCALC, TRIG, CHOLHDL, LDLDIRECT in the last 72 hours. Thyroid function studies  Recent Labs  12/07/15 1147  TSH 0.228*   Anemia work up No results for input(s): VITAMINB12, FOLATE,  FERRITIN, TIBC, IRON, RETICCTPCT in the last 72 hours. Sepsis Labs Invalid input(s): PROCALCITONIN,  WBC,  LACTICIDVEN Microbiology Recent Results (from the past 240 hour(s))  Blood Culture (routine x 2)     Status: None (Preliminary result)   Collection Time: 12/06/15  1:33 PM  Result Value Ref Range Status   Specimen Description BLOOD LEFT HAND  Final   Special Requests BOTTLES DRAWN AEROBIC AND ANAEROBIC 5CC  Final   Culture  Setup Time   Final    GRAM POSITIVE COCCI IN CHAINS IN PAIRS IN BOTH AEROBIC AND ANAEROBIC BOTTLES CRITICAL RESULT CALLED TO, READ BACK BY AND VERIFIED WITHElenore Paddy PHARMD [id number] 12/07/15 A BROWNING Performed at [place]  Final   Report Status PENDING  Incomplete  Urine culture     Status: Abnormal   Collection Time: 12/06/15  2:00 PM  Result Value Ref Range Status   Specimen Description URINE, CLEAN CATCH  Final   Special Requests NONE  Final   Culture >=100,000 COLONIES/mL KLEBSIELLA PNEUMONIAE (A)  Final   Report Status 12/08/2015 FINAL  Final   Organism ID, Bacteria KLEBSIELLA PNEUMONIAE (A)  Final      Susceptibility   Klebsiella pneumoniae - MIC*    AMPICILLIN >=32 RESISTANT Resistant     CEFAZOLIN <=4 SENSITIVE Sensitive     CEFTRIAXONE <=1 SENSITIVE Sensitive     CIPROFLOXACIN <=0.25 SENSITIVE Sensitive     GENTAMICIN <=1 SENSITIVE Sensitive     IMIPENEM <=0.25 SENSITIVE Sensitive     NITROFURANTOIN 64 INTERMEDIATE Intermediate     TRIMETH/SULFA <=20 SENSITIVE Sensitive     AMPICILLIN/SULBACTAM 4 SENSITIVE Sensitive     PIP/TAZO <=4 SENSITIVE Sensitive     Extended ESBL NEGATIVE Sensitive     * >=100,000 COLONIES/mL KLEBSIELLA PNEUMONIAE  Blood Culture (routine x 2)     Status: None (Preliminary result)   Collection Time: 12/06/15  2:37 PM  Result Value Ref Range Status   Specimen Description BLOOD RIGHT HAND  Final   Special Requests BOTTLES DRAWN AEROBIC AND ANAEROBIC 5 CC EA  Final   Culture   Setup Time   Final    GRAM POSITIVE COCCI IN CHAINS IN PAIRS IN BOTH AEROBIC AND ANAEROBIC BOTTLES Organism ID to follow CRITICAL RESULT CALLED TO, READ BACK BY AND VERIFIED WITHElenore Paddy PHARMD [id number] 12/07/15 A BROWNING Performed at [facility]  Final   Report Status  PENDING  Incomplete  Blood Culture ID Panel (Reflexed)     Status: Abnormal   Collection Time: 12/06/15  2:37 PM  Result Value Ref Range Status   Enterococcus species DETECTED (A) NOT DETECTED Final    Comment: CRITICAL RESULT CALLED TO, READ BACK BY AND VERIFIED WITH: L POINDEXTER PHARMD [id number] 12/07/15 A BROWNING    Vancomycin resistance NOT DETECTED NOT DETECTED Final   Listeria monocytogenes NOT DETECTED NOT DETECTED Final   Staphylococcus species NOT DETECTED NOT DETECTED Final   Staphylococcus aureus NOT DETECTED NOT DETECTED Final   Methicillin resistance NOT DETECTED NOT DETECTED Final   Streptococcus species NOT DETECTED NOT DETECTED Final   Streptococcus agalactiae NOT DETECTED NOT DETECTED Final   Streptococcus pneumoniae NOT DETECTED NOT DETECTED Final   Streptococcus pyogenes NOT DETECTED NOT DETECTED Final   Acinetobacter baumannii NOT DETECTED NOT DETECTED Final   Enterobacteriaceae species NOT DETECTED NOT DETECTED Final   Enterobacter cloacae complex NOT DETECTED NOT DETECTED Final   Escherichia coli NOT DETECTED NOT DETECTED Final   Klebsiella oxytoca NOT DETECTED NOT DETECTED Final   Klebsiella pneumoniae NOT DETECTED NOT DETECTED Final   Proteus species NOT DETECTED NOT DETECTED Final   Serratia marcescens NOT DETECTED NOT DETECTED Final   Carbapenem resistance NOT DETECTED NOT DETECTED Final   Haemophilus influenzae NOT DETECTED NOT DETECTED Final   Neisseria meningitidis NOT DETECTED NOT DETECTED Final   Pseudomonas aeruginosa NOT DETECTED NOT DETECTED Final   Candida albicans NOT DETECTED NOT DETECTED Final   Candida glabrata NOT DETECTED NOT DETECTED  Final   Candida krusei NOT DETECTED NOT DETECTED Final   Candida parapsilosis NOT DETECTED NOT DETECTED Final   Candida tropicalis NOT DETECTED NOT DETECTED Final    Comment: Performed at [facility]  MRSA PCR Screening     Status: None   Collection Time: 12/06/15  5:30 PM  Result Value Ref Range Status   MRSA by PCR NEGATIVE NEGATIVE Final    Comment:        The GeneXpert MRSA Assay (FDA approved for NASAL specimens only), is one component of a comprehensive MRSA colonization surveillance program. It is not intended to diagnose MRSA infection nor to guide or monitor treatment for MRSA infections.   Culture, blood (Routine X 2) w Reflex to ID Panel     Status: None (Preliminary result)   Collection Time: 12/07/15 10:35 AM  Result Value Ref Range Status   Specimen Description   Final    BLOOD LEFT HAND Performed at [facility]    Special Requests Immunocompromised  Final   Culture PENDING  Incomplete   Report Status PENDING  Incomplete    Radiology: Dg Chest 2 View  Result Date: 12/06/2015 CLINICAL DATA:  Fever; possible sepsis; chemo yesterday for brain ca EXAM: CHEST  2 VIEW COMPARISON:  Chest CT, 11/27/2015 FINDINGS: Left upper lobe mass without significant change from the prior study. Remainder of the lungs is clear. No pleural effusion or pneumothorax. Cardiac silhouette is normal in size. No mediastinal or hilar masses or evidence of adenopathy. Bony thorax is intact. IMPRESSION: No acute cardiopulmonary disease. Electronically Signed   By: Lajean Manes M.D.   On: 12/06/2015 15:07    Medications:   . ampicillin (OMNIPEN) IV  2 g Intravenous Q4H  . aspirin  81 mg Oral Daily  . atorvastatin  80 mg Oral QPM  . calcium carbonate (dosed in mg elemental calcium)  500 mg of elemental calcium Oral TID  .  dexamethasone  4 mg Oral QID  . feeding supplement (ENSURE ENLIVE)  237 mL Oral TID BM  . gentamicin  120 mg Intravenous Q12H  . levETIRAcetam  750 mg  Intravenous Q12H  . pantoprazole  40 mg Oral Daily  . potassium chloride  10 mEq Intravenous Q1 Hr x 4  . sodium chloride flush  3 mL Intravenous Q12H  . tamsulosin  0.4 mg Oral Daily   Continuous Infusions: . sodium chloride 125 mL/hr at 12/08/15 0128  . sodium chloride    . calcium gluconate infusion(930 mg elemental calcium/L) 0.5 mg elemental calcium/kg/hr (12/08/15 0933)    Time spent: 35 minutes.  The patient is medically complex with multiple co-morbidities and is at high risk for clinical deterioration and requires high complexity decision making.    LOS: 2 days   Dorlene Footman  [facility] Pager [phone]. If unable to reach me by pager, please call my cell phone at [phone].  *Please refer to [email or web address], password [email or web address] to get updated schedule on who will round on this patient, as hospitalists switch teams weekly. If 7PM-7AM, please contact night-coverage at [email or web address], password [email or web address] for any overnight needs.  12/08/2015, 10:15 AM

## 2015-12-08 NOTE — Progress Notes (Signed)
Pharmacy Antibiotic Note  Chad Ball is a 70 y.o. male admitted on 12/06/2015 with  with sepsis. PMH CAD w/ MI s/p stent, arthritis, GERD, and metastatic melanoma; recently admitted 7/30-8/10 for confusion and headaches during which brain mets were identified. Discharged to SNF and started radiation on 8/10   Pharmacy has been consulted for gentamicin dosing for possible enterococcal endocarditis (and may adjust ampicillin for renal function)  Today, 12/08/2015  Renal: SCr stable  WBC improved, slightly elevated  Random gentamicin level = 4.4 (10h post 1st dose level)  8/12 ECHO revealed EF [id number] with diastolic dysfunction, no vegetations seen but technically difficult study,  TEE planned for Mon/Tue  Plan:  Change gentamicin to 120mg  IV q12, the recommended dose for gram-positive synergy is 3mg /kg/day in 2-3 divided dose  Goal peak is ~3-5, trough < 1  Continue ampicillin 2gm IV q4h  Follow renal function and culture results   Height: 6\' 2"  (188 cm) Weight: 181 lb 14.1 oz (82.5 kg) IBW/kg (Calculated) : 82.2  Temp (24hrs), Avg:98.1 F (36.7 C), Min:97.7 F (36.5 C), Max:98.7 F (37.1 C)   Recent Labs Lab 12/02/15 0339 12/06/15 1436 12/06/15 1455 12/06/15 1800 12/07/15 0413 12/07/15 2317 12/08/15 0240  WBC 22.2* 20.3*  --   --  13.9*  --  10.8*  CREATININE 0.87 0.97  --   --  0.71 0.66 0.56*  LATICACIDVEN  --   --  2.24* 1.6  --   --   --   GENTRANDOM  --   --   --   --   --   --  4.4    Estimated Creatinine Clearance: 101.3 mL/min (by C-G formula based on SCr of 0.8 mg/dL).    Allergies  Allergen Reactions  . Food Anaphylaxis and Other (See Comments)    Pt is allergic to [place] nuts.     Antimicrobials this admission: Vancomycin 8/11>>8/12 Zosyn 8/11>>8/12 Ampicillin 8/12 >>  gentamicin 8/12 >>  Dose adjustments this admission:  Medication Levels: 12/08/15 @ 02:40: 10 hr Gent random = 4.4 mcg/ml : OK per nomogram. Continue Gent  regimen  Microbiology results: 8/11 BCx: GPC pairs/chains 2/2 - BCID Enterococcus species - no vanco resistance (organism ID pending) 8/11 UCx: > 100K GNR 8/11 MRSA PCR: neg 8/12 BCx: pending    Thank you for allowing pharmacy to be a part of this patient's care.  Doreene Eland, PharmD, BCPS.   Pager: [phone] 12/08/2015 8:36 AM

## 2015-12-08 NOTE — Progress Notes (Signed)
Subjective: No new complaints, more awake and alert   Antibiotics:  Anti-infectives    Start     Dose/Rate Route Frequency Ordered Stop   12/08/15 1800  gentamicin (GARAMYCIN) 120 mg in dextrose 5 % 50 mL IVPB     120 mg 106 mL/hr over 30 Minutes Intravenous Every 12 hours 12/08/15 0831     12/07/15 1800  Ampicillin-Sulbactam (UNASYN) 3 g in sodium chloride 0.9 % 100 mL IVPB  Status:  Discontinued     3 g 100 mL/hr over 60 Minutes Intravenous Every 6 hours 12/07/15 1548 12/07/15 1553   12/07/15 1700  gentamicin (GARAMYCIN) 560 mg in dextrose 5 % 100 mL IVPB  Status:  Discontinued     7 mg/kg  80.3 kg 114 mL/hr over 60 Minutes Intravenous Every 24 hours 12/07/15 1558 12/08/15 0831   12/07/15 1630  ampicillin (OMNIPEN) 2 g in sodium chloride 0.9 % 50 mL IVPB     2 g 150 mL/hr over 20 Minutes Intravenous Every 4 hours 12/07/15 1558     12/07/15 1600  ampicillin (OMNIPEN) 2 g in sodium chloride 0.9 % 50 mL IVPB  Status:  Discontinued     2 g 150 mL/hr over 20 Minutes Intravenous Every 4 hours 12/07/15 1543 12/07/15 1547   12/07/15 0930  ampicillin (OMNIPEN) 2 g in sodium chloride 0.9 % 50 mL IVPB  Status:  Discontinued     2 g 150 mL/hr over 20 Minutes Intravenous Every 4 hours 12/07/15 0906 12/07/15 1525   12/06/15 2200  vancomycin (VANCOCIN) IVPB 750 mg/150 ml premix  Status:  Discontinued     750 mg 150 mL/hr over 60 Minutes Intravenous Every 8 hours 12/06/15 1535 12/07/15 0906   12/06/15 2200  piperacillin-tazobactam (ZOSYN) IVPB 3.375 g  Status:  Discontinued     3.375 g 12.5 mL/hr over 240 Minutes Intravenous Every 8 hours 12/06/15 1535 12/07/15 0906   12/06/15 1415  piperacillin-tazobactam (ZOSYN) IVPB 3.375 g     3.375 g 100 mL/hr over 30 Minutes Intravenous  Once 12/06/15 1413 12/06/15 1551   12/06/15 1415  vancomycin (VANCOCIN) IVPB 1000 mg/200 mL premix     1,000 mg 200 mL/hr over 60 Minutes Intravenous  Once 12/06/15 1413 12/06/15 1619       Medications: Scheduled Meds: . ampicillin (OMNIPEN) IV  2 g Intravenous Q4H  . antiseptic oral rinse  7 mL Mouth Rinse BID  . atorvastatin  80 mg Oral QPM  . calcium carbonate (dosed in mg elemental calcium)  500 mg of elemental calcium Oral TID  . dexamethasone  4 mg Oral QID  . feeding supplement (ENSURE ENLIVE)  237 mL Oral TID BM  . gentamicin  120 mg Intravenous Q12H  . levETIRAcetam  750 mg Intravenous Q12H  . pantoprazole  40 mg Oral Daily  . sodium chloride flush  3 mL Intravenous Q12H  . tamsulosin  0.4 mg Oral Daily   Continuous Infusions: . calcium gluconate infusion(930 mg elemental calcium/L) 0.5 mg elemental calcium/kg/hr (12/08/15 0933)   PRN Meds:.atropine, HYDROcodone-acetaminophen, lip balm, nitroGLYCERIN, ondansetron **OR** ondansetron (ZOFRAN) IV, sodium chloride    Objective: Weight change: 6 lb 14.1 oz (3.12 kg)  Intake/Output Summary (Last 24 hours) at 12/08/15 1908 Last data filed at 12/08/15 1830  Gross per 24 hour  Intake          3560.68 ml  Output             3075 ml  Net           485.68 ml   Blood pressure (!) 108/58, pulse 74, temperature 97.2 F (36.2 C), temperature source Oral, resp. rate 20, height 6\' 2"  (1.88 m), weight 181 lb 14.1 oz (82.5 kg), SpO2 92 %. Temp:  [97.2 F (36.2 C)-98.7 F (37.1 C)] 97.2 F (36.2 C) (08/13 1200) Pulse Rate:  [42-77] 74 (08/13 1800) Resp:  [13-26] 20 (08/13 1800) BP: (79-174)/(44-84) 108/58 (08/13 1600) SpO2:  [91 %-98 %] 92 % (08/13 1800) Weight:  [181 lb 14.1 oz (82.5 kg)] 181 lb 14.1 oz (82.5 kg) (08/13 0445)  Physical Exam: General: Alert and awake, oriented to person and place HEENT: anicteric sclera,  EOMI, oropharynx clear and without exudate Cardiovascular: bradycardic , normal r,  no murmur rubs or gallops Pulmonary: clear to auscultation bilaterally, no wheezing, rales or rhonchi Gastrointestinal: soft nontender, nondistended, normal bowel sounds, Musculoskeletal: no  clubbing or edema  noted bilaterally Skin, soft tissue: no rashes Neuro: nonfocal, strength and sensation intact  CBC: CBC Latest Ref Rng & Units 12/08/2015 12/07/2015 12/06/2015  WBC 4.0 - 10.5 K/uL 10.8(H) 13.9(H) 20.3(H)  Hemoglobin 13.0 - 17.0 g/dL 10.8(L) 12.3(L) 15.7  Hematocrit 39.0 - 52.0 % 31.2(L) 36.0(L) 44.6  Platelets 150 - 400 K/uL 78(L) 83(L) 115(L)      BMET  Recent Labs  12/07/15 2317 12/08/15 0240  NA 137 135  K 3.4* 3.3*  CL 111 111  CO2 22 22  GLUCOSE 118* 99  BUN 21* 22*  CREATININE 0.66 0.56*  CALCIUM 7.1* 7.1*     Liver Panel   Recent Labs  12/06/15 1436  PROT 6.1*  ALBUMIN 2.6*  AST 34  ALT 30  ALKPHOS 95  BILITOT 1.2       Sedimentation Rate  Recent Labs  12/08/15 0240  ESRSEDRATE 2   C-Reactive Protein  Recent Labs  12/08/15 0240  CRP 3.8*    Micro Results: Recent Results (from the past 720 hour(s))  MRSA PCR Screening     Status: None   Collection Time: 11/24/15  4:23 PM  Result Value Ref Range Status   MRSA by PCR NEGATIVE NEGATIVE Final    Comment:        The GeneXpert MRSA Assay (FDA approved for NASAL specimens only), is one component of a comprehensive MRSA colonization surveillance program. It is not intended to diagnose MRSA infection nor to guide or monitor treatment for MRSA infections.   Culture, blood (routine x 2)     Status: None   Collection Time: 11/26/15  5:18 PM  Result Value Ref Range Status   Specimen Description BLOOD LEFT ANTECUBITAL  Final   Special Requests IN PEDIATRIC BOTTLE 2CC  Final   Culture NO GROWTH 5 DAYS  Final   Report Status 12/01/2015 FINAL  Final  Culture, blood (routine x 2)     Status: None   Collection Time: 11/26/15  5:20 PM  Result Value Ref Range Status   Specimen Description BLOOD RIGHT HAND  Final   Special Requests BOTTLES DRAWN AEROBIC AND ANAEROBIC 5CC  Final   Culture NO GROWTH 5 DAYS  Final   Report Status 12/01/2015 FINAL  Final  Blood Culture (routine x 2)     Status:  Abnormal (Preliminary result)   Collection Time: 12/06/15  1:33 PM  Result Value Ref Range Status   Specimen Description BLOOD LEFT HAND  Final   Special Requests BOTTLES DRAWN AEROBIC AND ANAEROBIC 5CC  Final  Culture  Setup Time   Final    GRAM POSITIVE COCCI IN CHAINS IN PAIRS IN BOTH AEROBIC AND ANAEROBIC BOTTLES CRITICAL RESULT CALLED TO, READ BACK BY AND VERIFIED WITH: Elenore Paddy PHARMD [id number] 12/07/15 A BROWNING Performed at [facility] (A)  Final   Report Status PENDING  Incomplete  Urine culture     Status: Abnormal   Collection Time: 12/06/15  2:00 PM  Result Value Ref Range Status   Specimen Description URINE, CLEAN CATCH  Final   Special Requests NONE  Final   Culture >=100,000 COLONIES/mL KLEBSIELLA PNEUMONIAE (A)  Final   Report Status 12/08/2015 FINAL  Final   Organism ID, Bacteria KLEBSIELLA PNEUMONIAE (A)  Final      Susceptibility   Klebsiella pneumoniae - MIC*    AMPICILLIN >=32 RESISTANT Resistant     CEFAZOLIN <=4 SENSITIVE Sensitive     CEFTRIAXONE <=1 SENSITIVE Sensitive     CIPROFLOXACIN <=0.25 SENSITIVE Sensitive     GENTAMICIN <=1 SENSITIVE Sensitive     IMIPENEM <=0.25 SENSITIVE Sensitive     NITROFURANTOIN 64 INTERMEDIATE Intermediate     TRIMETH/SULFA <=20 SENSITIVE Sensitive     AMPICILLIN/SULBACTAM 4 SENSITIVE Sensitive     PIP/TAZO <=4 SENSITIVE Sensitive     Extended ESBL NEGATIVE Sensitive     * >=100,000 COLONIES/mL KLEBSIELLA PNEUMONIAE  Blood Culture (routine x 2)     Status: Abnormal (Preliminary result)   Collection Time: 12/06/15  2:37 PM  Result Value Ref Range Status   Specimen Description BLOOD RIGHT HAND  Final   Special Requests BOTTLES DRAWN AEROBIC AND ANAEROBIC 5 CC EA  Final   Culture  Setup Time   Final    GRAM POSITIVE COCCI IN CHAINS IN PAIRS IN BOTH AEROBIC AND ANAEROBIC BOTTLES CRITICAL RESULT CALLED TO, READ BACK BY AND VERIFIED WITH: Elenore Paddy PHARMD [id number] 12/07/15 A BROWNING     Culture (A)  Final    ENTEROCOCCUS SPECIES SUSCEPTIBILITIES TO FOLLOW Performed at [facility]    Report Status PENDING  Incomplete  Blood Culture ID Panel (Reflexed)     Status: Abnormal   Collection Time: 12/06/15  2:37 PM  Result Value Ref Range Status   Enterococcus species DETECTED (A) NOT DETECTED Final    Comment: CRITICAL RESULT CALLED TO, READ BACK BY AND VERIFIED WITH: L POINDEXTER PHARMD [id number] 12/07/15 A BROWNING    Vancomycin resistance NOT DETECTED NOT DETECTED Final   Listeria monocytogenes NOT DETECTED NOT DETECTED Final   Staphylococcus species NOT DETECTED NOT DETECTED Final   Staphylococcus aureus NOT DETECTED NOT DETECTED Final   Methicillin resistance NOT DETECTED NOT DETECTED Final   Streptococcus species NOT DETECTED NOT DETECTED Final   Streptococcus agalactiae NOT DETECTED NOT DETECTED Final   Streptococcus pneumoniae NOT DETECTED NOT DETECTED Final   Streptococcus pyogenes NOT DETECTED NOT DETECTED Final   Acinetobacter baumannii NOT DETECTED NOT DETECTED Final   Enterobacteriaceae species NOT DETECTED NOT DETECTED Final   Enterobacter cloacae complex NOT DETECTED NOT DETECTED Final   Escherichia coli NOT DETECTED NOT DETECTED Final   Klebsiella oxytoca NOT DETECTED NOT DETECTED Final   Klebsiella pneumoniae NOT DETECTED NOT DETECTED Final   Proteus species NOT DETECTED NOT DETECTED Final   Serratia marcescens NOT DETECTED NOT DETECTED Final   Carbapenem resistance NOT DETECTED NOT DETECTED Final   Haemophilus influenzae NOT DETECTED NOT DETECTED Final   Neisseria meningitidis NOT DETECTED NOT DETECTED Final   Pseudomonas aeruginosa  NOT DETECTED NOT DETECTED Final   Candida albicans NOT DETECTED NOT DETECTED Final   Candida glabrata NOT DETECTED NOT DETECTED Final   Candida krusei NOT DETECTED NOT DETECTED Final   Candida parapsilosis NOT DETECTED NOT DETECTED Final   Candida tropicalis NOT DETECTED NOT DETECTED Final    Comment: Performed at  [facility]  MRSA PCR Screening     Status: None   Collection Time: 12/06/15  5:30 PM  Result Value Ref Range Status   MRSA by PCR NEGATIVE NEGATIVE Final    Comment:        The GeneXpert MRSA Assay (FDA approved for NASAL specimens only), is one component of a comprehensive MRSA colonization surveillance program. It is not intended to diagnose MRSA infection nor to guide or monitor treatment for MRSA infections.   Culture, blood (Routine X 2) w Reflex to ID Panel     Status: None (Preliminary result)   Collection Time: 12/07/15  9:47 AM  Result Value Ref Range Status   Specimen Description BLOOD LEFT HAND  Final   Special Requests IN PEDIATRIC BOTTLE 1CC  Final   Culture   Final    NO GROWTH < 24 HOURS Performed at [facility]    Report Status PENDING  Incomplete  Culture, blood (Routine X 2) w Reflex to ID Panel     Status: None (Preliminary result)   Collection Time: 12/07/15 10:35 AM  Result Value Ref Range Status   Specimen Description BLOOD LEFT HAND  Final   Special Requests Immunocompromised  Final   Culture   Final    NO GROWTH < 24 HOURS Performed at [facility]    Report Status PENDING  Incomplete    Studies/Results: Mr Chad Ball Wo Contrast  Result Date: 12/08/2015 CLINICAL DATA:  History of metastatic melanoma with multiple brain lesions. Treated with steroids. Preadmission with possible sepsis 2 days ago. EXAM: MRI HEAD WITHOUT AND WITH CONTRAST TECHNIQUE: Multiplanar, multiecho pulse sequences of the brain and surrounding structures were obtained without and with intravenous contrast. CONTRAST:  78mL MULTIHANCE GADOBENATE DIMEGLUMINE 529 MG/ML IV SOLN COMPARISON:  11/26/2011 FINDINGS: As demonstrated previously, there are multiple infra and supratentorial brain masses with associated hemorrhage consistent with metastatic melanoma with associated hemorrhage. There has been evolutionary maturation of the associated blood products, but there  are no newly seen lesions, larger lesions or new hemorrhage. Mass effect is most prominent in the right frontal lobe with the largest lesion measures 5 cm in diameter and is associated with vasogenic edema. Second largest lesion in the left temporal lobe is also similar, with associated vasogenic edema. Ventricular size is stable. No residual layering blood in the ventricles. No extra-axial collection. No evidence of new ischemic infarction. Major vessels at the base of the brain show flow. IMPRESSION: Multiple hemorrhagic metastatic melanoma lesions, approximately 25 in number. No change in the size, number or mass effect of the lesions. Hemorrhagic components are undergoing expected maturational changes in signal characteristics. Mass effect and edema associated with the 2 largest lesions, in the right frontal brain and in the left temporal brain appear quite similar. Electronically Signed   By: Nelson Chimes M.D.   On: 12/08/2015 18:30      Assessment/Plan:  INTERVAL HISTORY: repeat MRI perfomred  Urine culture with klebsiella pna  Repeat blood cultures NG in 24 hours   Principal Problem:   Sepsis due to urinary tract infection (HCC) Active Problems:   Essential hypertension   Hyperlipidemia  CAD in native artery   Brain metastasis (HCC)   Metastatic melanoma (HCC)   Sepsis ([place])   Hyperglycemia   Acute encephalopathy   Hypocalcemia   Hypoalbuminemia   Nonspecific abnormal electrocardiogram (ECG) (EKG)   Sepsis due to enterococcus (HCC)   Septic shock (HCC)   Acute endocarditis   Septic embolism (HCC)   Normocytic anemia   Hypokalemia    Chad Ball is a 70 y.o. male with  multiple CNS lesions thought to be metastatic melanoma (but no tissue diagnostic proof) started on XRT and steroids now admitted with sepsis, confusion and found to have Enterococcal bacteremia (discordant with urine cx) bradyarrythmias and in my mind a high likelihood of infectious endocarditis  #1  Enterococcal Bacteremia and likely Endocarditis:  --repeat blood cultures are NG so far --continue AMP and GENT --watch renal fxn --TEE on Monday  #2 CNS lesions: my understanding is that Neurology are fairly convinced that the patient has mets rather than emboli in the brain.  IF the patient has infectious endocarditis with left sided involvement I would want to make sure there is clear cut consensus on the pathology in the brain and if not pursue a diagnostic brain biopsy  I suppose one could simultaneusly treat for IE and presumed brain mets with antibiotics, XRT and steroids. However obviously the steroids in particular would not be desirable if brain pathology involved embolized bacterial material from the heart valve.  #3 Worsening TTpenia: likely due to his septic picture but may begin to worry about drug effect  #4 Klebsiella in urine: I am really not terribly worried about this organism. The enterococcus is driving his infectious pathology. The kleb pna is S to gentamicin regardless    I spent greater than 40 minutes with the patient including greater than 50% of time in face to face counsel of the patient and in coordination of their care.  Dr. Baxter Flattery to assume care tomorrow.    LOS: 2 days   Alcide Evener 12/08/2015, 7:08 PM

## 2015-12-08 NOTE — Progress Notes (Signed)
Pharmacy Antibiotic Note  Chad Ball is a 70 y.o. male admitted on 12/06/2015 with  with sepsis. PMH CAD w/ MI s/p stent, arthritis, GERD, and metastatic melanoma; recently admitted 7/30-8/10 for confusion and headaches during which brain mets were identified. Discharged to SNF and started radiation on 8/10   Pharmacy has been consulted for gentamicin dosing for bacteremia.  Plan: Continue Gentamicin 7mg /kg (560mg ) IV q24h Follow renal function, clinical course   Height: 6\' 2"  (188 cm) Weight: 181 lb 14.1 oz (82.5 kg) IBW/kg (Calculated) : 82.2  Temp (24hrs), Avg:97.9 F (36.6 C), Min:97.6 F (36.4 C), Max:98.2 F (36.8 C)   Recent Labs Lab 12/02/15 0339 12/06/15 1436 12/06/15 1455 12/06/15 1800 12/07/15 0413 12/07/15 2317 12/08/15 0240  WBC 22.2* 20.3*  --   --  13.9*  --  10.8*  CREATININE 0.87 0.97  --   --  0.71 0.66 0.56*  LATICACIDVEN  --   --  2.24* 1.6  --   --   --   GENTRANDOM  --   --   --   --   --   --  4.4    Estimated Creatinine Clearance: 101.3 mL/min (by C-G formula based on SCr of 0.8 mg/dL).    Allergies  Allergen Reactions  . Food Anaphylaxis and Other (See Comments)    Pt is allergic to [place] nuts.     Antimicrobials this admission: Vancomycin 8/11>>8/12 Zosyn 8/11>>8/12 Ampicillin 8/12 >>  gentamicin 8/12 >>  Dose adjustments this admission:  Medication Levels: 12/08/15 @ 02:40: 10 hr Gent random = 4.4 mcg/ml : OK per nomogram. Continue Gent regimen  Microbiology results: 8/11BCx: GPC pairs/chains 2/2 - BCID Enterococcus species - no vanco resistance (organism ID pending) 8/11UCx: GNR 8/11 MRSA PCR: neg  Thank you for allowing pharmacy to be a part of this patient's care.  Leone Haven, PharmD 12/08/2015, 4:47 AM

## 2015-12-08 NOTE — Progress Notes (Signed)
Subjective:  Alert; no chest pain.  Receive a dose of atropine at 6: 40 am with HR in 40's; now in mid 50s  Objective:   Vital Signs : Vitals:   12/08/15 0700 12/08/15 0800 12/08/15 0801 12/08/15 0900  BP: (!) 162/82 (!) 158/83  (!) 143/70  Pulse: 77 65  (!) 55  Resp: _0 Temp:   98.7 F (37.1 C)   TempSrc:   Oral   SpO2: 91% 93%  98%  Weight:      Height:        Intake/Output from previous day:  Intake/Output Summary (Last 24 hours) at 12/08/15 0938 Last data filed at 12/08/15 0800  Gross per 24 hour  Intake           2321.5 ml  Output             1375 ml  Net            946.5 ml    I/O since admission: +[phone]  Wt Readings from Last 3 Encounters:  12/08/15 181 lb 14.1 oz (82.5 kg)  11/26/15 180 lb (81.6 kg)  12/02/15 180 lb (81.6 kg)    Medications: . ampicillin (OMNIPEN) IV  2 g Intravenous Q4H  . aspirin  81 mg Oral Daily  . atorvastatin  80 mg Oral QPM  . calcium carbonate (dosed in mg elemental calcium)  500 mg of elemental calcium Oral TID  . dexamethasone  4 mg Oral QID  . feeding supplement (ENSURE ENLIVE)  237 mL Oral TID BM  . gentamicin  120 mg Intravenous Q12H  . levETIRAcetam  750 mg Intravenous Q12H  . pantoprazole  40 mg Oral Daily  . potassium chloride  10 mEq Intravenous Q1 Hr x 4  . sodium chloride flush  3 mL Intravenous Q12H  . tamsulosin  0.4 mg Oral Daily    . sodium chloride 125 mL/hr at 12/08/15 0128  . sodium chloride    . calcium gluconate infusion(930 mg elemental calcium/L)      Physical Exam:   General appearance: no distress Neck: no adenopathy, no JVD, supple, symmetrical, trachea midline and thyroid not enlarged, symmetric, no tenderness/mass/nodules Lungs: clear to auscultation bilaterally Heart: Regualr rhythm in the 50s.; did not appreciate a significant murmur Abdomen: soft, non-tender; bowel sounds normal; no masses,  no organomegaly Extremities: extremities normal, atraumatic, no cyanosis or  edema Pulses: 2+ and symmetric Neurologic: Grossly normal   Rate: 54  Rhythm: sinus bradycardia  ECG (independently read by me): NSR at 73; T wave inversion V2-4    Lab Results:   Recent Labs  12/07/15 0413 12/07/15 1147 12/07/15 2317 12/08/15 0240  NA 136  --  137 135  K 3.7  --  3.4* 3.3*  CL 112*  --  111 111  CO2 20*  --  22 22  GLUCOSE 105*  --  118* 99  BUN 24*  --  21* 22*  CREATININE 0.71  --  0.66 0.56*  CALCIUM 6.8*  --  7.1* 7.1*  MG  --  1.6*  --   --   PHOS  --  2.9  --   --     Hepatic Function Latest Ref Rng & Units 12/06/2015 11/29/2015 11/24/2015  Total Protein 6.5 - 8.1 g/dL 6.1(L) 5.8(L) 7.2  Albumin 3.5 - 5.0 g/dL 2.6(L) 3.0(L) 3.8  AST 15 - 41 U/L 34 28 32  ALT 17 - 63 U/L 30 25 28  Alk Phosphatase 38 - 126 U/L 95 73 81  Total Bilirubin 0.3 - 1.2 mg/dL 1.2 1.7(H) 1.3(H)     Recent Labs  12/06/15 1436 12/07/15 0413 12/08/15 0240  WBC 20.3* 13.9* 10.8*  NEUTROABS 17.5*  --   --   HGB 15.7 12.3* 10.8*  HCT 44.6 36.0* 31.2*  MCV 88.0 89.6 88.1  PLT 115* 83* 78*     Recent Labs  12/07/15 1828 12/07/15 2317 12/08/15 0838  TROPONINI 0.12* 0.09* 0.07*    Lab Results  Component Value Date   TSH 0.228 (L) 12/07/2015   No results for input(s): HGBA1C in the last 72 hours.   Recent Labs  12/06/15 1436  PROT 6.1*  ALBUMIN 2.6*  AST 34  ALT 30  ALKPHOS 95  BILITOT 1.2    Recent Labs  12/06/15 1800  INR 1.17   BNP (last 3 results)  Recent Labs  12/07/15 1900  BNP 270.6*    ProBNP (last 3 results) No results for input(s): PROBNP in the last 8760 hours.   Lipid Panel  No results found for: CHOL, TRIG, HDL, CHOLHDL, VLDL, LDLCALC, LDLDIRECT    Imaging:  Dg Chest 2 View  Result Date: 12/06/2015 CLINICAL DATA:  Fever; possible sepsis; chemo yesterday for brain ca EXAM: CHEST  2 VIEW COMPARISON:  Chest CT, 11/27/2015 FINDINGS: Left upper lobe mass without significant change from the prior study. Remainder of the  lungs is clear. No pleural effusion or pneumothorax. Cardiac silhouette is normal in size. No mediastinal or hilar masses or evidence of adenopathy. Bony thorax is intact. IMPRESSION: No acute cardiopulmonary disease. Electronically Signed   By: Lajean Manes M.D.   On: 12/06/2015 15:07    MR BRAIN IMPRESSION: Numerous intracranial masses within the supratentorial and infratentorial region numbering approximately 25 containing blood breakdown products or melanin suggestive of metastatic melanoma given patient's history.  Some of the larger lesions include:  Right anterior frontal 4.8 x 4.9 x 5 cm mass with marked surrounding vasogenic edema. Cannot exclude extension into the superior right orbital region as bone of the orbital roof may be destroyed. This causes significant mass effect upon the right frontal horn of the lateral ventricular system which is displaced posteriorly and to the right.  Left temporal lobe 4.8 x 3.2 x 3.1 cm mass with marked surrounding vasogenic edema and flattening of the left temporal horn.  Superior right cerebellar 1.8 cm and 1.4 cm mass with surrounding vasogenic edema and mild compression the right lateral aspect of the fourth ventricle.  Blood or proteinaceous material layering within the deep dependent aspect of the lateral ventricles suggesting breakthrough of hemorrhagic metastatic lesion into the ventricular system. Primary intracranial hemorrhage is a secondary less likely consideration.  Abnormal appearance of the sulci on FLAIR imaging may be related to oxygen saturation secondary to the fact this was performed under general anesthesia limiting detection of the possibility of subarachnoid hemorrhage.  In addition to significant distortion of the lateral ventricle by metastatic disease, mild hydrocephalus may be present.   CT CHEST/ABDOMEN IMPRESSION: 1. Pulmonary nodule within the left upper lobe is identified and suspicious for  metastases. 2. No specific findings identified to suggest metastatic disease to the abdomen or pelvis. 3. Striated nephrographic appearance of the right kidney. This is a nonspecific finding and may be seen with pyelonephritis as well as vascular abnormality such as embolic phenomenon. Clinical correlation suggested.  MR LIVER IMPRESSION: 1. Moderate motion degradation. 2. The hepatic lesions are consistent with cysts.  No evidence of hepatic metastasis. 3. Right renal abnormality is favored to represent pyelonephritis. Correlate with urinalysis. 4. Ascending aortic aneurysm, on the order of 4.5 cm. Recommend semi-annual imaging followup by CTA or MRA and referral to cardiothoracic surgery if not already obtained. This recommendation follows 2010 ACCF/AHA/AATS/ACR/ASA/SCA/SCAI/SIR/STS/SVM Guidelines for the Diagnosis and Management of Patients With Thoracic Aortic Disease. Circulation. 2010; 121: H086-V784  ------------------------------------------------------------------- 12/07/15 ECHO Study Conclusions  - Left ventricle: The cavity size was normal. Wall thickness was   normal. Systolic function was normal. The estimated ejection   fraction was in the range of 60% to 65%. Wall motion was normal;   there were no regional wall motion abnormalities. Doppler   parameters are consistent with abnormal left ventricular   relaxation (grade 1 diastolic dysfunction). - Aortic valve: There was moderate regurgitation. Valve area (VTI):   2.41 cm^2. Valve area (Vmax): 2.27 cm^2. Valve area (Vmean): 2.18   cm^2. - Aorta: Aortic root dimension: 44 mm (ED). Ascending aortic   diameter: 48 mm (S). - Aortic root: The aortic root was mildly dilated. - Ascending aorta: The ascending aorta was moderately dilated. - Left atrium: The atrium was mildly dilated. - Technically difficult study. If high suspicion of valvular   endocarditis consider TEE.   Assessment/Plan:   Principal Problem:    Sepsis due to urinary tract infection (HCC) Active Problems:   Essential hypertension   Hyperlipidemia   CAD in native artery   Brain metastasis (HCC)   Metastatic melanoma (HCC)   Sepsis (HCC)   Hyperglycemia   Acute encephalopathy   Hypocalcemia   Hypoalbuminemia   Nonspecific abnormal electrocardiogram (ECG) (EKG)   Sepsis due to enterococcus ([place])   Septic shock (HCC)   Acute endocarditis   Septic embolism ([place])   1. Sinus Bradycardia;  HR now in mid 50's, received 1 dose aof atropine yesterday and 1 at 6: 40 am today 2. Metastatic melanoma 3. Ascending Aorta Aneurysm  4.  Enteroccoccal bacteremia and possible endocarditis  R/O septic emboli to CNS vs mets. 5.  Moderate AR on echo.  Currently maintaining Sinus Bradycardia. No further atropine since 6:40 am.  Ok to eat today. Keep NPO in am and will see if schedule allows for TEE Monday and if not then Tuesday.     Troy Sine, MD, [facility] 12/08/2015, 9:38 AM

## 2015-12-09 ENCOUNTER — Encounter: Payer: Self-pay | Admitting: Radiation Oncology

## 2015-12-09 ENCOUNTER — Ambulatory Visit: Payer: Medicare Other

## 2015-12-09 DIAGNOSIS — C439 Malignant melanoma of skin, unspecified: Secondary | ICD-10-CM

## 2015-12-09 DIAGNOSIS — R51 Headache: Secondary | ICD-10-CM

## 2015-12-09 LAB — CULTURE, BLOOD (ROUTINE X 2)

## 2015-12-09 LAB — CBC
HCT: 35.8 % — ABNORMAL LOW (ref 39.0–52.0)
Hemoglobin: 12.4 g/dL — ABNORMAL LOW (ref 13.0–17.0)
MCH: 30.2 pg (ref 26.0–34.0)
MCHC: 34.6 g/dL (ref 30.0–36.0)
MCV: 87.3 fL (ref 78.0–100.0)
PLATELETS: 102 10*3/uL — AB (ref 150–400)
RBC: 4.1 MIL/uL — AB (ref 4.22–5.81)
RDW: 13.7 % (ref 11.5–15.5)
WBC: 9.7 10*3/uL (ref 4.0–10.5)

## 2015-12-09 LAB — BASIC METABOLIC PANEL
Anion gap: 7 (ref 5–15)
BUN: 17 mg/dL (ref 6–20)
CALCIUM: 8.8 mg/dL — AB (ref 8.9–10.3)
CHLORIDE: 104 mmol/L (ref 101–111)
CO2: 25 mmol/L (ref 22–32)
CREATININE: 0.68 mg/dL (ref 0.61–1.24)
GFR calc non Af Amer: >60 mL/min (ref >60)
GLUCOSE: 120 mg/dL — AB (ref 65–99)
Potassium: 3.5 mmol/L (ref 3.5–5.1)
Sodium: 136 mmol/L (ref 135–145)

## 2015-12-09 LAB — HEMOGLOBIN A1C
Hgb A1c MFr Bld: 6 % — ABNORMAL HIGH (ref 4.8–5.6)
MEAN PLASMA GLUCOSE: 126 mg/dL

## 2015-12-09 MED ORDER — POTASSIUM CHLORIDE CRYS ER 10 MEQ PO TBCR
30.0000 meq | EXTENDED_RELEASE_TABLET | Freq: Once | ORAL | Status: AC
  Administered 2015-12-09: 30 meq via ORAL
  Filled 2015-12-09: qty 3

## 2015-12-09 NOTE — Progress Notes (Signed)
Subjective:  Alert; no chest pain.  HR in 40's; tolerating well.  Objective:   Vital Signs : Vitals:   12/09/15 1000 12/09/15 1100 12/09/15 1200 12/09/15 1204  BP: (!) 151/71 138/82 128/69   Pulse: (!) 44 (!) 43 (!) 44   Resp: 17 19 (!) 22   Temp:    98 F (36.7 C)  TempSrc:    Oral  SpO2: 96% 97% 95%   Weight:      Height:        Intake/Output from previous day:  Intake/Output Summary (Last 24 hours) at 12/09/15 1321 Last data filed at 12/09/15 1218  Gross per 24 hour  Intake           2578.1 ml  Output             5651 ml  Net          -3072.9 ml    I/O since admission: +9750  Wt Readings from Last 3 Encounters:  12/09/15 174 lb 6.1 oz (79.1 kg)  11/26/15 180 lb (81.6 kg)  12/02/15 180 lb (81.6 kg)    Medications: . ampicillin (OMNIPEN) IV  2 g Intravenous Q4H  . antiseptic oral rinse  7 mL Mouth Rinse BID  . atorvastatin  80 mg Oral QPM  . calcium carbonate (dosed in mg elemental calcium)  500 mg of elemental calcium Oral TID  . dexamethasone  4 mg Oral QID  . feeding supplement (ENSURE ENLIVE)  237 mL Oral TID BM  . gentamicin  120 mg Intravenous Q12H  . levETIRAcetam  750 mg Intravenous Q12H  . pantoprazole  40 mg Oral Daily  . sodium chloride flush  3 mL Intravenous Q12H  . tamsulosin  0.4 mg Oral Daily       Physical Exam:   General appearance: no distress Neck: no adenopathy, no JVD, supple, symmetrical, trachea midline and thyroid not enlarged, symmetric, no tenderness/mass/nodules Lungs: clear to auscultation bilaterally Heart: Regualr rhythm in the 50s.; did not appreciate a significant murmur Abdomen: soft, non-tender; bowel sounds normal; no masses,  no organomegaly Extremities: extremities normal, atraumatic, no cyanosis or edema Pulses: 2+ and symmetric Neurologic: Grossly normal   Rate: 44  Rhythm: sinus bradycardia  ECG (independently read by me): NSR at 73; T wave inversion V2-4    Lab Results:   Recent Labs   12/07/15 1147 12/07/15 2317 12/08/15 0240 12/09/15 0400  NA  --  137 135 136  K  --  3.4* 3.3* 3.5  CL  --  111 111 104  CO2  --  '22 22 25  ' GLUCOSE  --  118* 99 120*  BUN  --  21* 22* 17  CREATININE  --  0.66 0.56* 0.68  CALCIUM  --  7.1* 7.1* 8.8*  MG 1.6*  --   --   --   PHOS 2.9  --   --   --     Hepatic Function Latest Ref Rng & Units 12/06/2015 11/29/2015 11/24/2015  Total Protein 6.5 - 8.1 g/dL 6.1(L) 5.8(L) 7.2  Albumin 3.5 - 5.0 g/dL 2.6(L) 3.0(L) 3.8  AST 15 - 41 U/L 34 28 32  ALT 17 - 63 U/L '30 25 28  ' Alk Phosphatase 38 - 126 U/L 95 73 81  Total Bilirubin 0.3 - 1.2 mg/dL 1.2 1.7(H) 1.3(H)     Recent Labs  12/06/15 1436 12/07/15 0413 12/08/15 0240 12/09/15 0400  WBC 20.3* 13.9* 10.8* 9.7  NEUTROABS 17.5*  --   --   --  HGB 15.7 12.3* 10.8* 12.4*  HCT 44.6 36.0* 31.2* 35.8*  MCV 88.0 89.6 88.1 87.3  PLT 115* 83* 78* 102*     Recent Labs  12/08/15 0838 12/08/15 1509 12/08/15 2124  TROPONINI 0.07* 0.07* 0.07*    Lab Results  Component Value Date   TSH 0.228 (L) 12/07/2015    Recent Labs  12/07/15 0413  HGBA1C 6.0*     Recent Labs  12/06/15 1436  PROT 6.1*  ALBUMIN 2.6*  AST 34  ALT 30  ALKPHOS 95  BILITOT 1.2    Recent Labs  12/06/15 1800  INR 1.17   BNP (last 3 results)  Recent Labs  12/07/15 1900  BNP 270.6*     Imaging:  Mr [name] VE Contrast  Result Date: 12/08/2015 CLINICAL DATA:  History of metastatic melanoma with multiple brain lesions. Treated with steroids. Preadmission with possible sepsis 2 days ago. EXAM: MRI HEAD WITHOUT AND WITH CONTRAST TECHNIQUE: Multiplanar, multiecho pulse sequences of the brain and surrounding structures were obtained without and with intravenous contrast. CONTRAST:  30m MULTIHANCE GADOBENATE DIMEGLUMINE 529 MG/ML IV SOLN COMPARISON:  11/26/2011 FINDINGS: As demonstrated previously, there are multiple infra and supratentorial brain masses with associated hemorrhage consistent with  metastatic melanoma with associated hemorrhage. There has been evolutionary maturation of the associated blood products, but there are no newly seen lesions, larger lesions or new hemorrhage. Mass effect is most prominent in the right frontal lobe with the largest lesion measures 5 cm in diameter and is associated with vasogenic edema. Second largest lesion in the left temporal lobe is also similar, with associated vasogenic edema. Ventricular size is stable. No residual layering blood in the ventricles. No extra-axial collection. No evidence of new ischemic infarction. Major vessels at the base of the brain show flow. IMPRESSION: Multiple hemorrhagic metastatic melanoma lesions, approximately 25 in number. No change in the size, number or mass effect of the lesions. Hemorrhagic components are undergoing expected maturational changes in signal characteristics. Mass effect and edema associated with the 2 largest lesions, in the right frontal brain and in the left temporal brain appear quite similar. Electronically Signed   By: MNelson ChimesM.D.   On: 12/08/2015 18:30    MR BRAIN IMPRESSION: Numerous intracranial masses within the supratentorial and infratentorial region numbering approximately 25 containing blood breakdown products or melanin suggestive of metastatic melanoma given patient's history.  Some of the larger lesions include:  Right anterior frontal 4.8 x 4.9 x 5 cm mass with marked surrounding vasogenic edema. Cannot exclude extension into the superior right orbital region as bone of the orbital roof may be destroyed. This causes significant mass effect upon the right frontal horn of the lateral ventricular system which is displaced posteriorly and to the right.  Left temporal lobe 4.8 x 3.2 x 3.1 cm mass with marked surrounding vasogenic edema and flattening of the left temporal horn.  Superior right cerebellar 1.8 cm and 1.4 cm mass with surrounding vasogenic edema and mild  compression the right lateral aspect of the fourth ventricle.  Blood or proteinaceous material layering within the deep dependent aspect of the lateral ventricles suggesting breakthrough of hemorrhagic metastatic lesion into the ventricular system. Primary intracranial hemorrhage is a secondary less likely consideration.  Abnormal appearance of the sulci on FLAIR imaging may be related to oxygen saturation secondary to the fact this was performed under general anesthesia limiting detection of the possibility of subarachnoid hemorrhage.  In addition to significant distortion of the lateral ventricle  by metastatic disease, mild hydrocephalus may be present.   CT CHEST/ABDOMEN IMPRESSION: 1. Pulmonary nodule within the left upper lobe is identified and suspicious for metastases. 2. No specific findings identified to suggest metastatic disease to the abdomen or pelvis. 3. Striated nephrographic appearance of the right kidney. This is a nonspecific finding and may be seen with pyelonephritis as well as vascular abnormality such as embolic phenomenon. Clinical correlation suggested.  MR LIVER IMPRESSION: 1. Moderate motion degradation. 2. The hepatic lesions are consistent with cysts. No evidence of hepatic metastasis. 3. Right renal abnormality is favored to represent pyelonephritis. Correlate with urinalysis. 4. Ascending aortic aneurysm, on the order of 4.5 cm. Recommend semi-annual imaging followup by CTA or MRA and referral to cardiothoracic surgery if not already obtained. This recommendation follows 2010 ACCF/AHA/AATS/ACR/ASA/SCA/SCAI/SIR/STS/SVM Guidelines for the Diagnosis and Management of Patients With Thoracic Aortic Disease. Circulation. 2010; 121: E527-P824  ------------------------------------------------------------------- 12/07/15 ECHO Study Conclusions  - Left ventricle: The cavity size was normal. Wall thickness was   normal. Systolic function was normal.  The estimated ejection   fraction was in the range of 60% to 65%. Wall motion was normal;   there were no regional wall motion abnormalities. Doppler   parameters are consistent with abnormal left ventricular   relaxation (grade 1 diastolic dysfunction). - Aortic valve: There was moderate regurgitation. Valve area (VTI):   2.41 cm^2. Valve area (Vmax): 2.27 cm^2. Valve area (Vmean): 2.18   cm^2. - Aorta: Aortic root dimension: 44 mm (ED). Ascending aortic   diameter: 48 mm (S). - Aortic root: The aortic root was mildly dilated. - Ascending aorta: The ascending aorta was moderately dilated. - Left atrium: The atrium was mildly dilated. - Technically difficult study. If high suspicion of valvular   endocarditis consider TEE.   Assessment/Plan:   Principal Problem:   Sepsis due to urinary tract infection (HCC) Active Problems:   Essential hypertension   Hyperlipidemia   CAD in native artery   Brain metastasis (HCC)   Metastatic melanoma (HCC)   Sepsis (HCC)   Hyperglycemia   Acute encephalopathy   Hypocalcemia   Hypoalbuminemia   Nonspecific abnormal electrocardiogram (ECG) (EKG)   Sepsis due to enterococcus (HCC)   Septic shock (HCC)   Acute endocarditis   Septic embolism (HCC)   Normocytic anemia   Hypokalemia   1. Sinus Bradycardia;  HR now in mid 40's  2. Metastatic melanoma 3. Ascending Aorta Aneurysm  4.  Enteroccoccal bacteremia and possible endocarditis  R/O septic emboli to CNS vs mets. 5.  Moderate AR on echo.  Currently maintaining Sinus Bradycardia and asymptomatic. Could consider IV Dopamine if he becomes symptomatic or HR sustains less than 40. Poor pacemaker candidate at this time secondary to infection.  Ok to eat today. Keep NPO after midnight for TEE Tuesday.     [facility] has been requested to perform a transesophageal echocardiogram on Chad Ball for endocarditis.  After careful review of history and examination, the risks and benefits of  transesophageal echocardiogram have been explained including risks of esophageal damage, perforation (1:10,000 risk), bleeding, pharyngeal hematoma as well as other potential complications associated with conscious sedation including aspiration, arrhythmia, respiratory failure and death. Alternatives to treatment were discussed, questions were answered. Patient is willing to proceed.   Kerin Ransom,  12/09/2015 1:24 PM    Patient seen and examined. Agree with assessment and plan. No chest pain or dyspnea. More alert today on decadron. Sinus bradycardia in upper 40s to low 50s. Could  not arrange TEE today; scheduled for 2 pm tomorrow with Dr. Acie Fredrickson. NPO post MN.   Troy Sine, MD, [facility] 12/09/2015 1:40 PM

## 2015-12-09 NOTE — Progress Notes (Signed)
Progress Note    Chad Ball  [id number] DOB: 03-Apr-1946  DOA: 12/06/2015 PCP: [name], MD    Brief Narrative:   Chad Ball is an 70 y.o. 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 Workup in the ED was consistent with sepsis from a UTI.  Assessment/Plan:   Principal Problem:   Enterococcal sepsis due to complicated urinary tract infection ([place]) complicated by acute encephalopathy, rule out endocarditis Patient was admitted as inpatient due to intensity of service and high risk for decompensation. He has a chronic indwelling Foley with evidence of urinary retention (bladder scan performed with 100 mL of residual urine noted). Continue Flomax.Blood pressure has improved, lactate has cleared and his WBC count is improving. Broad-spectrum antibiotics with vancomycin/Zosyn were initiated in the ED. His antibiotics were changed to ampicillin and gentamicin by ID. Since he is chronically on Decadron for his brain metastasis, stress dose steroids were initiated as well, But now back on oral Decadron which has better bioavailability in the brain. Blood cultures are positive for enterococcus. ID recommends TEE to rule out endocarditis. TTE done but was a poor study to rule out vegetations, so TEE scheduled for 12/10/15.    Klebsiella UTI Ampicillin resistant, but sensitive to gentamycin.  Discussed with Dr. Tommy Medal who favors not broadening ampicillin to Unasyn.    Active Problems:   Normocytic anemia Hemoglobin currently stable.    Hypokalemia  We'll replete.    Hypocalcemia/hypoalbuminemia Calcium up to 9.9 when corrected for low  albumin. Likely from sepsis (was normal on 11/25/15).  Less likely tumor lysis (other electrolytes OK). Phosphorus WNL. Continue calcium carbonate supplements.    Sinus bradycardia and demand ischemia TSH low, so hypothyroidism not likely the cause of his bradycardia.  Continue to replace calcium as low calcium can be associated with heart rhythm abnormalities. Atropine ordered as needed.12-lead EKG showed anterior T-wave inversions. Troponins elevated with downward trend. Cardiology following with thought that patient's bradycardia might be related to increased intracranial hypertension.    Hyperglycemia/prediabetes Likely steroid-induced. Hemoglobin A1c is 6%, consistent with prediabetes.    Essential hypertension Blood pressure stable, now off IV fluids.    Hyperlipidemia/CAD in native artery/abnormal EKG Continue aspirin and Lipitor. Troponins elevated but trend is down. Cardiology following. ST & T wave abnormalities on EKG worrisome for anterolateral ischemia. 2-D echo did not show any evidence of regional wall motion abnormalities.    Metastatic melanoma (HCC)/brain metastasis/suspected increased intracranial pressure Continue Decadron and Keppra. Have discussed with neurologist, Dr. Tasia Catchings, (thinks brain lesions are more consistent with metastatic melanoma rather than septic emboli) and Dr. Lisbeth Renshaw.  I have spoke with Dr. Kathyrn Sheriff who feels that the patient's brain masses are MOST consistent with metastasis given the the presence of blood products near the metastasis, which is a fairly common feature of melanoma involving the brain. At this point, would continue Decadron and proceed with radiation therapy as planned. Consider palliative care consultation given prognosis.  Family Communication/Anticipated D/C date and plan/Code Status   DVT prophylaxis: SCDs ordered. Code Status: Full Code.  Family Communication: Wife updated at bedside. Disposition Plan: From SNF.   Medical  Consultants:  Cardiology  Infectious Disease  Critical Care  Radiation Oncology   Procedures:    2-D echocardiogram 12/07/15  Study Conclusions  - Left ventricle: The cavity size was normal. Wall thickness was   normal. Systolic function was normal. The estimated ejection   fraction was in the range of 60% to 65%. Wall motion was normal;   there were no regional wall motion abnormalities. Doppler   parameters are consistent with abnormal left ventricular   relaxation (grade 1 diastolic dysfunction). - Aortic valve: There was moderate regurgitation. Valve area (VTI):   2.41 cm^2. Valve area (Vmax): 2.27 cm^2. Valve area (Vmean): 2.18   cm^2. - Aorta: Aortic root dimension: 44 mm (ED). Ascending aortic   diameter: 48 mm (S). - Aortic root: The aortic root was mildly dilated. - Ascending aorta: The ascending aorta was moderately dilated. - Left atrium: The atrium was mildly dilated. - Technically difficult study. If high suspicion of valvular   endocarditis consider TEE.  Anti-Infectives:   Vancomycin 12/06/15--->12/07/15 Zosyn 12/06/15---> 12/07/15 Ampicillin 12/07/15---> Gentamicin 12/07/15--->  Subjective:   Chad Ball is more awake/alert.  He still reports a mild headache, but no nausea, vomiting, or dyspnea.   Objective:    Vitals:   12/09/15 1000 12/09/15 1100 12/09/15 1200 12/09/15 1204  BP: (!) 151/71 138/82 128/69   Pulse: (!) 44 (!) 43 (!) 44   Resp: 17 19 (!) 22   Temp:    98 F (36.7 C)  TempSrc:    Oral  SpO2: 96% 97% 95%   Weight:      Height:        Intake/Output Summary (Last 24 hours) at 12/09/15 1541 Last data filed at 12/09/15 1218  Gross per 24 hour  Intake           2139.3 ml  Output             4776 ml  Net          -2636.7 ml   Filed Weights   12/07/15 0400 12/08/15 0445 12/09/15 0444  Weight: 80.3 kg (177 lb 0.5 oz) 82.5 kg (181 lb 14.1 oz) 79.1 kg (174 lb 6.1 oz)    Exam: General exam: Appears calm and comfortable,  but is a bit lethargic. Respiratory system: Clear to auscultation. Respiratory effort normal. Cardiovascular system: S1 & S2 heard, bradycardic. No JVD,  rubs, gallops or clicks. No murmurs. Gastrointestinal system: Abdomen is nondistended, soft and nontender. No organomegaly or masses felt. Normal bowel sounds heard. Central nervous system: Lethargic. No focal neurological deficits. Extremities: No clubbing,  or cyanosis. No edema. Skin: No rashes, lesions or ulcers. Psychiatry: Judgement and insight appear normal. Mood & affect appropriate.   Data Reviewed:   I have personally reviewed following labs and imaging studies:  Labs: Basic Metabolic Panel:  Recent Labs Lab 12/06/15 1436 12/07/15 0413 12/07/15 1147 12/07/15 2317 12/08/15 0240 12/09/15 0400  NA 135 136  --  137 135 136  K 4.0 3.7  --  3.4* 3.3* 3.5  CL 104 112*  --  111 111 104  CO2 23 20*  --  22 22 25   GLUCOSE 142* 105*  --  118* 99 120*  BUN 39* 24*  --  21* 22* 17  CREATININE 0.97 0.71  --  0.66 0.56* 0.68  CALCIUM 8.2* 6.8*  --  7.1* 7.1* 8.8*  MG  --   --  1.6*  --   --   --   PHOS  --   --  2.9  --   --   --    GFR Estimated Creatinine Clearance: 97.5 mL/min (by C-G formula based on SCr of 0.8 mg/dL). Liver Function Tests:  Recent Labs Lab 12/06/15 1436  AST 34  ALT 30  ALKPHOS 95  BILITOT 1.2  PROT 6.1*  ALBUMIN 2.6*   Coagulation profile  Recent Labs Lab 12/06/15 1800  INR 1.17    CBC:  Recent Labs Lab 12/06/15 1436 12/07/15 0413 12/08/15 0240 12/09/15 0400  WBC 20.3* 13.9* 10.8* 9.7  NEUTROABS 17.5*  --   --   --   HGB 15.7 12.3* 10.8* 12.4*  HCT 44.6 36.0* 31.2* 35.8*  MCV 88.0 89.6 88.1 87.3  PLT 115* 83* 78* 102*   Cardiac Enzymes:  Recent Labs Lab 12/07/15 1828 12/07/15 2317 12/08/15 0838 12/08/15 1509 12/08/15 2124  TROPONINI 0.12* 0.09* 0.07* 0.07* 0.07*   BNP: Invalid input(s): POCBNP CBG:  Recent Labs Lab 12/04/15 1221 12/04/15 1757 12/04/15 2153  12/05/15 0750 12/05/15 1142  GLUCAP 173* 142* 170* 129* 150*   D-Dimer No results for input(s): DDIMER in the last 72 hours. Hgb A1c  Recent Labs  12/07/15 0413  HGBA1C 6.0*   Lipid Profile No results for input(s): CHOL, HDL, LDLCALC, TRIG, CHOLHDL, LDLDIRECT in the last 72 hours. Thyroid function studies  Recent Labs  12/07/15 1147  TSH 0.228*   Anemia work up No results for input(s): VITAMINB12, FOLATE, FERRITIN, TIBC, IRON, RETICCTPCT in the last 72 hours. Sepsis Labs Invalid input(s): PROCALCITONIN,  WBC,  LACTICIDVEN Microbiology Recent Results (from the past 240 hour(s))  Blood Culture (routine x 2)     Status: Abnormal   Collection Time: 12/06/15  1:33 PM  Result Value Ref Range Status   Specimen Description BLOOD LEFT HAND  Final   Special Requests BOTTLES DRAWN AEROBIC AND ANAEROBIC 5CC  Final   Culture  Setup Time   Final    GRAM POSITIVE COCCI IN CHAINS IN PAIRS IN BOTH AEROBIC AND ANAEROBIC BOTTLES CRITICAL RESULT CALLED TO, READ BACK BY AND VERIFIED WITH: L POINDEXTER PHARMD 0527 12/07/15 A BROWNING    Culture (A)  Final    ENTEROCOCCUS SPECIES SUSCEPTIBILITIES PERFORMED ON PREVIOUS CULTURE WITHIN THE LAST 5 DAYS. Performed at [facility]    Report Status 12/09/2015 FINAL  Final  Urine culture     Status: Abnormal   Collection Time: 12/06/15  2:00 PM  Result Value Ref Range Status   Specimen Description URINE, CLEAN CATCH  Final   Special Requests NONE  Final   Culture >=100,000 COLONIES/mL KLEBSIELLA PNEUMONIAE (A)  Final   Report Status 12/08/2015 FINAL  Final   Organism ID, Bacteria KLEBSIELLA PNEUMONIAE (A)  Final      Susceptibility   Klebsiella pneumoniae - MIC*    AMPICILLIN >=32 RESISTANT Resistant     CEFAZOLIN <=4 SENSITIVE Sensitive     CEFTRIAXONE <=1 SENSITIVE Sensitive     CIPROFLOXACIN <=0.25 SENSITIVE Sensitive     GENTAMICIN <=1 SENSITIVE Sensitive     IMIPENEM <=0.25 SENSITIVE Sensitive     NITROFURANTOIN 64  INTERMEDIATE Intermediate     TRIMETH/SULFA <=20 SENSITIVE Sensitive     AMPICILLIN/SULBACTAM 4 SENSITIVE Sensitive     PIP/TAZO <=4 SENSITIVE Sensitive     Extended ESBL NEGATIVE Sensitive     * >=100,000 COLONIES/mL KLEBSIELLA PNEUMONIAE  Blood Culture (routine x 2)     Status: Abnormal   Collection Time: 12/06/15  2:37 PM  Result Value Ref Range Status  Specimen Description BLOOD RIGHT HAND  Final   Special Requests BOTTLES DRAWN AEROBIC AND ANAEROBIC 5 CC EA  Final   Culture  Setup Time   Final    GRAM POSITIVE COCCI IN CHAINS IN PAIRS IN BOTH AEROBIC AND ANAEROBIC BOTTLES CRITICAL RESULT CALLED TO, READ BACK BY AND VERIFIED WITHElenore Paddy PHARMD [id number] 12/07/15 A BROWNING Performed at [facility] (A)  Final   Report Status 12/09/2015 FINAL  Final   Organism ID, Bacteria ENTEROCOCCUS SPECIES  Final      Susceptibility   Enterococcus species - MIC*    AMPICILLIN <=2 SENSITIVE Sensitive     VANCOMYCIN 1 SENSITIVE Sensitive     GENTAMICIN SYNERGY SENSITIVE Sensitive     * ENTEROCOCCUS SPECIES  Blood Culture ID Panel (Reflexed)     Status: Abnormal   Collection Time: 12/06/15  2:37 PM  Result Value Ref Range Status   Enterococcus species DETECTED (A) NOT DETECTED Final    Comment: CRITICAL RESULT CALLED TO, READ BACK BY AND VERIFIED WITH: L POINDEXTER PHARMD [id number] 12/07/15 A BROWNING    Vancomycin resistance NOT DETECTED NOT DETECTED Final   Listeria monocytogenes NOT DETECTED NOT DETECTED Final   Staphylococcus species NOT DETECTED NOT DETECTED Final   Staphylococcus aureus NOT DETECTED NOT DETECTED Final   Methicillin resistance NOT DETECTED NOT DETECTED Final   Streptococcus species NOT DETECTED NOT DETECTED Final   Streptococcus agalactiae NOT DETECTED NOT DETECTED Final   Streptococcus pneumoniae NOT DETECTED NOT DETECTED Final   Streptococcus pyogenes NOT DETECTED NOT DETECTED Final   Acinetobacter baumannii NOT DETECTED NOT  DETECTED Final   Enterobacteriaceae species NOT DETECTED NOT DETECTED Final   Enterobacter cloacae complex NOT DETECTED NOT DETECTED Final   Escherichia coli NOT DETECTED NOT DETECTED Final   Klebsiella oxytoca NOT DETECTED NOT DETECTED Final   Klebsiella pneumoniae NOT DETECTED NOT DETECTED Final   Proteus species NOT DETECTED NOT DETECTED Final   Serratia marcescens NOT DETECTED NOT DETECTED Final   Carbapenem resistance NOT DETECTED NOT DETECTED Final   Haemophilus influenzae NOT DETECTED NOT DETECTED Final   Neisseria meningitidis NOT DETECTED NOT DETECTED Final   Pseudomonas aeruginosa NOT DETECTED NOT DETECTED Final   Candida albicans NOT DETECTED NOT DETECTED Final   Candida glabrata NOT DETECTED NOT DETECTED Final   Candida krusei NOT DETECTED NOT DETECTED Final   Candida parapsilosis NOT DETECTED NOT DETECTED Final   Candida tropicalis NOT DETECTED NOT DETECTED Final    Comment: Performed at [facility]  MRSA PCR Screening     Status: None   Collection Time: 12/06/15  5:30 PM  Result Value Ref Range Status   MRSA by PCR NEGATIVE NEGATIVE Final    Comment:        The GeneXpert MRSA Assay (FDA approved for NASAL specimens only), is one component of a comprehensive MRSA colonization surveillance program. It is not intended to diagnose MRSA infection nor to guide or monitor treatment for MRSA infections.   Culture, blood (Routine X 2) w Reflex to ID Panel     Status: None (Preliminary result)   Collection Time: 12/07/15  9:47 AM  Result Value Ref Range Status   Specimen Description BLOOD LEFT HAND  Final   Special Requests IN PEDIATRIC BOTTLE 1CC  Final   Culture   Final    NO GROWTH 2 DAYS Performed at [facility]    Report Status PENDING  Incomplete  Culture, blood (  Routine X 2) w Reflex to ID Panel     Status: None (Preliminary result)   Collection Time: 12/07/15 10:35 AM  Result Value Ref Range Status   Specimen Description BLOOD LEFT HAND  Final    Special Requests Immunocompromised  Final   Culture   Final    NO GROWTH 2 DAYS Performed at [facility]    Report Status PENDING  Incomplete    Radiology: Mr [name] Wo Contrast  Result Date: 12/08/2015 CLINICAL DATA:  History of metastatic melanoma with multiple brain lesions. Treated with steroids. Preadmission with possible sepsis 2 days ago. EXAM: MRI HEAD WITHOUT AND WITH CONTRAST TECHNIQUE: Multiplanar, multiecho pulse sequences of the brain and surrounding structures were obtained without and with intravenous contrast. CONTRAST:  92mL MULTIHANCE GADOBENATE DIMEGLUMINE 529 MG/ML IV SOLN COMPARISON:  11/26/2011 FINDINGS: As demonstrated previously, there are multiple infra and supratentorial brain masses with associated hemorrhage consistent with metastatic melanoma with associated hemorrhage. There has been evolutionary maturation of the associated blood products, but there are no newly seen lesions, larger lesions or new hemorrhage. Mass effect is most prominent in the right frontal lobe with the largest lesion measures 5 cm in diameter and is associated with vasogenic edema. Second largest lesion in the left temporal lobe is also similar, with associated vasogenic edema. Ventricular size is stable. No residual layering blood in the ventricles. No extra-axial collection. No evidence of new ischemic infarction. Major vessels at the base of the brain show flow. IMPRESSION: Multiple hemorrhagic metastatic melanoma lesions, approximately 25 in number. No change in the size, number or mass effect of the lesions. Hemorrhagic components are undergoing expected maturational changes in signal characteristics. Mass effect and edema associated with the 2 largest lesions, in the right frontal brain and in the left temporal brain appear quite similar. Electronically Signed   By: Nelson Chimes M.D.   On: 12/08/2015 18:30    Medications:   . ampicillin (OMNIPEN) IV  2 g Intravenous Q4H  .  antiseptic oral rinse  7 mL Mouth Rinse BID  . atorvastatin  80 mg Oral QPM  . calcium carbonate (dosed in mg elemental calcium)  500 mg of elemental calcium Oral TID  . dexamethasone  4 mg Oral QID  . feeding supplement (ENSURE ENLIVE)  237 mL Oral TID BM  . gentamicin  120 mg Intravenous Q12H  . levETIRAcetam  750 mg Intravenous Q12H  . pantoprazole  40 mg Oral Daily  . sodium chloride flush  3 mL Intravenous Q12H  . tamsulosin  0.4 mg Oral Daily   Continuous Infusions:    Time spent: 35 minutes.  The patient is medically complex with multiple co-morbidities and is at high risk for clinical deterioration and requires high complexity decision making.    LOS: 3 days   [facility] Pager [phone]. If unable to reach me by pager, please call my cell phone at [phone].  *Please refer to [email or web address], password [email or web address] to get updated schedule on who will round on this patient, as hospitalists switch teams weekly. If 7PM-7AM, please contact night-coverage at [email or web address], password [email or web address] for any overnight needs.  12/09/2015, 3:41 PM

## 2015-12-09 NOTE — Progress Notes (Signed)
[facility] for Infectious Disease    Date of Admission:  12/06/2015   Total days of antibiotics 5        Day 4 amp        Day 4 gent           ID: Chad Ball is a 70 y.o. male with metastatic melanoma admitted for sepsis due to enterococcal bacteremia Principal Problem:   Sepsis due to urinary tract infection ([place]) Active Problems:   Essential hypertension   Hyperlipidemia   CAD in native artery   Brain metastasis (HCC)   Metastatic melanoma (HCC)   Sepsis (HCC)   Hyperglycemia   Acute encephalopathy   Hypocalcemia   Hypoalbuminemia   Nonspecific abnormal electrocardiogram (ECG) (EKG)   Sepsis due to enterococcus (HCC)   Septic shock (HCC)   Acute endocarditis   Septic embolism (HCC)   Normocytic anemia   Hypokalemia    Subjective: Afebrile, initially was bradycardic in 40s this am but since this afternoon has been in 70s. He had regularly formed BM today. He denies abd pain. Has mild headache this afternoon, no fever, chills, nightsweats  Medications:  . ampicillin (OMNIPEN) IV  2 g Intravenous Q4H  . antiseptic oral rinse  7 mL Mouth Rinse BID  . atorvastatin  80 mg Oral QPM  . calcium carbonate (dosed in mg elemental calcium)  500 mg of elemental calcium Oral TID  . dexamethasone  4 mg Oral QID  . feeding supplement (ENSURE ENLIVE)  237 mL Oral TID BM  . gentamicin  120 mg Intravenous Q12H  . levETIRAcetam  750 mg Intravenous Q12H  . pantoprazole  40 mg Oral Daily  . potassium chloride  30 mEq Oral Once  . sodium chloride flush  3 mL Intravenous Q12H  . tamsulosin  0.4 mg Oral Daily    Objective: Vital signs in last 24 hours: Temp:  [97 F (36.1 C)-98.3 F (36.8 C)] 97.8 F (36.6 C) (08/14 1547) Pulse Rate:  [36-76] 76 (08/14 1600) Resp:  [15-23] 22 (08/14 1600) BP: (107-151)/(57-82) 110/61 (08/14 1600) SpO2:  [92 %-99 %] 93 % (08/14 1600) Weight:  [174 lb 6.1 oz (79.1 kg)] 174 lb 6.1 oz (79.1 kg) (08/14 0444) Physical Exam    Constitutional: He is oriented to person, place, and time. He appears frail, chronically ill. No distress.  HENT:  Mouth/Throat: Oropharynx is clear and moist. No oropharyngeal exudate.  Cardiovascular: Normal rate, regular rhythm and normal heart sounds. Exam reveals no gallop and no friction rub.  No murmur heard.  Pulmonary/Chest: Effort normal and breath sounds normal. No respiratory distress. He has no wheezes.  Abdominal: Soft. Bowel sounds are normal. He exhibits no distension. There is no tenderness.  Lymphadenopathy:  He has no cervical adenopathy.  Neurological: He is alert and oriented to person, place, and time.  Skin: Skin is warm and dry. No rash noted. No erythema.  Psychiatric: He has a normal mood and affect. His behavior is normal.     Lab Results  Recent Labs  12/08/15 0240 12/09/15 0400  WBC 10.8* 9.7  HGB 10.8* 12.4*  HCT 31.2* 35.8*  NA 135 136  K 3.3* 3.5  CL 111 104  CO2 22 25  BUN 22* 17  CREATININE 0.56* 0.68   Sedimentation Rate  Recent Labs  12/08/15 0240  ESRSEDRATE 2   C-Reactive Protein  Recent Labs  12/08/15 0240  CRP 3.8*    Microbiology: 8/12 blood cx ngtd 8/11  blood cx enterococcus amp S 8/11 urine cx klebsiella /amp R, amp/sub S, gent s  Studies/Results: Mr Chad Ball Wo Contrast  Result Date: 12/08/2015 CLINICAL DATA:  History of metastatic melanoma with multiple brain lesions. Treated with steroids. Preadmission with possible sepsis 2 days ago. EXAM: MRI HEAD WITHOUT AND WITH CONTRAST TECHNIQUE: Multiplanar, multiecho pulse sequences of the brain and surrounding structures were obtained without and with intravenous contrast. CONTRAST:  78mL MULTIHANCE GADOBENATE DIMEGLUMINE 529 MG/ML IV SOLN COMPARISON:  11/26/2011 FINDINGS: As demonstrated previously, there are multiple infra and supratentorial brain masses with associated hemorrhage consistent with metastatic melanoma with associated hemorrhage. There has been evolutionary  maturation of the associated blood products, but there are no newly seen lesions, larger lesions or new hemorrhage. Mass effect is most prominent in the right frontal lobe with the largest lesion measures 5 cm in diameter and is associated with vasogenic edema. Second largest lesion in the left temporal lobe is also similar, with associated vasogenic edema. Ventricular size is stable. No residual layering blood in the ventricles. No extra-axial collection. No evidence of new ischemic infarction. Major vessels at the base of the brain show flow. IMPRESSION: Multiple hemorrhagic metastatic melanoma lesions, approximately 25 in number. No change in the size, number or mass effect of the lesions. Hemorrhagic components are undergoing expected maturational changes in signal characteristics. Mass effect and edema associated with the 2 largest lesions, in the right frontal brain and in the left temporal brain appear quite similar. Electronically Signed   By: Nelson Chimes M.D.   On: 12/08/2015 18:30   Assessment/Plan:  Enterococcal bacteremia = currently on ampicillin and gentamicin dual therapy to cover for possible endocarditis. He is slated for TEE tomorrow. CNS lesions are not c/w septic emboli but more with multiple hemorrhagic mets from underlying metastatic melanoma. Repeat blood cx on 8/12 are NGTD. If TEE is negative, recommend to treat for 2 wk only, monotherapy with ampicillin  Bradycardia = TEE will help see if any abscess related, but appears to be improving. Could consider MR or CT to see if TEE is negative if symptoms persist  +urine culture klebsiella = patient is likely colonized with kleb. Poor collection since many squamous cells in urine specimen. His current abtx regimen would cover uropathogen  Cns lesion = c/w melanoma mets. Continue with decadron to minimize edema. Defer to dr. Lisbeth Renshaw to resume radiation treatment   [place], [facility] Cell:  [phone] Pager: [phone]  12/09/2015, 4:16 PM

## 2015-12-09 NOTE — Progress Notes (Signed)
PULMONARY / CRITICAL CARE MEDICINE   Name: COVE HONDROS MRN: [id number] DOB: 10-02-1945    ADMISSION DATE:  12/06/2015 CONSULTATION DATE: 12/08/2015  REFERRING MD:  Dr. Rockne Menghini, [facility]  CHIEF COMPLAINT:  Bradycardia  HISTORY OF PRESENT ILLNESS:   70 yo male with history of melanoma with metastatic brain lesions.  He as in hospital from 11/24/15 to 12/05/15.  He was started on decadron, and was being evaluated by oncology and radiation oncology.  He was d/c to SNF.  On 12/06/15 he was brought back to ER with concern for sepsis.  He had low grade fever, low blood pressure, and altered mental status.  He was started on Abx for UTI.  He was found to have Enterococcal bacteremia, and automatic ID consult was done.  There was concern for septic emboli and endocarditis.  He developed bradycardia.  Cardiology was consulted, and there concern was for increased intracranial pressure causing hypotension and bradycardia.  CT abd/pelvis and MRI liver from recent admission showed evidence concerning for Rt pyelonephritis.  His wife reports that he has problems with his prostate and difficulty passing urine.  He was treated for a bladder infection about 1 month ago, and had foley catheter recently.    She also reports that he has know about "stretched" aorta and leaky heart valve for years.  He still has headache, but mental status is improved as per wife.  He denies chest pain, nausea, abdominal pain, or dyspnea.  He feels tired, and wants to rest.   SUBJECTIVE:  No issues overnight. Comfortable. Not in distress. (-) subj complaints.  Per RN > on and off confusion.   VITAL SIGNS: BP 130/71   Pulse (!) 43   Temp 97.8 F (36.6 C) (Oral)   Resp 15   Ht 6\' 2"  (1.88 m)   Wt 79.1 kg (174 lb 6.1 oz)   SpO2 94%   BMI 22.39 kg/m   HEMODYNAMICS:    VENTILATOR SETTINGS:    INTAKE / OUTPUT: I/O last 3 completed shifts: In: 4558.4 [I.V.:3187.4; IV Piggyback:1371] Out: [id number]  PHYSICAL  EXAMINATION: General: Pleasant not in distress. Comfortable. Some confusion.  Neuro:  Alert, normal strength, moves all extremities, CN intact HEENT:  Pupils reactive, no stridor Cardiovascular:  Regular, 2/6 SM Lungs:  No wheeze/rales. Good ae. CTA.  Abdomen:  Soft, non tender (-) masses.  Musculoskeletal:  No edema Skin:  No rashes  LABS:  BMET  Recent Labs Lab 12/07/15 2317 12/08/15 0240 12/09/15 0400  NA 137 135 136  K 3.4* 3.3* 3.5  CL 111 111 104  CO2 22 22 25   BUN 21* 22* 17  CREATININE 0.66 0.56* 0.68  GLUCOSE 118* 99 120*    Electrolytes  Recent Labs Lab 12/07/15 1147 12/07/15 2317 12/08/15 0240 12/09/15 0400  CALCIUM  --  7.1* 7.1* 8.8*  MG 1.6*  --   --   --   PHOS 2.9  --   --   --     CBC  Recent Labs Lab 12/07/15 0413 12/08/15 0240 12/09/15 0400  WBC 13.9* 10.8* 9.7  HGB 12.3* 10.8* 12.4*  HCT 36.0* 31.2* 35.8*  PLT 83* 78* 102*    Coag's  Recent Labs Lab 12/06/15 1800  INR 1.17    Sepsis Markers  Recent Labs Lab 12/06/15 1455 12/06/15 1800  LATICACIDVEN 2.24* 1.6    ABG No results for input(s): PHART, PCO2ART, PO2ART in the last 168 hours.  Liver Enzymes  Recent Labs Lab 12/06/15 1436  AST 34  ALT 30  ALKPHOS 95  BILITOT 1.2  ALBUMIN 2.6*    Cardiac Enzymes  Recent Labs Lab 12/08/15 0838 12/08/15 1509 12/08/15 2124  TROPONINI 0.07* 0.07* 0.07*    Glucose  Recent Labs Lab 12/04/15 0823 12/04/15 1221 12/04/15 1757 12/04/15 2153 12/05/15 0750 12/05/15 1142  GLUCAP 144* 173* 142* 170* 129* 150*    Imaging Mr Brain W Wo Contrast  Result Date: 12/08/2015 CLINICAL DATA:  History of metastatic melanoma with multiple brain lesions. Treated with steroids. Preadmission with possible sepsis 2 days ago. EXAM: MRI HEAD WITHOUT AND WITH CONTRAST TECHNIQUE: Multiplanar, multiecho pulse sequences of the brain and surrounding structures were obtained without and with intravenous contrast. CONTRAST:  72mL  MULTIHANCE GADOBENATE DIMEGLUMINE 529 MG/ML IV SOLN COMPARISON:  11/26/2011 FINDINGS: As demonstrated previously, there are multiple infra and supratentorial brain masses with associated hemorrhage consistent with metastatic melanoma with associated hemorrhage. There has been evolutionary maturation of the associated blood products, but there are no newly seen lesions, larger lesions or new hemorrhage. Mass effect is most prominent in the right frontal lobe with the largest lesion measures 5 cm in diameter and is associated with vasogenic edema. Second largest lesion in the left temporal lobe is also similar, with associated vasogenic edema. Ventricular size is stable. No residual layering blood in the ventricles. No extra-axial collection. No evidence of new ischemic infarction. Major vessels at the base of the brain show flow. IMPRESSION: Multiple hemorrhagic metastatic melanoma lesions, approximately 25 in number. No change in the size, number or mass effect of the lesions. Hemorrhagic components are undergoing expected maturational changes in signal characteristics. Mass effect and edema associated with the 2 largest lesions, in the right frontal brain and in the left temporal brain appear quite similar. Electronically Signed   By: Nelson Chimes M.D.   On: 12/08/2015 18:30     STUDIES:  8/01 MRI brain >> numerous masses containing blood products or melanin, surrounding vasogenic edema, possible hemorrhage in ventricular system and mild hydrocephalus 8/02 CT chest >> coronary calcifications, 2.4 cm LUL nodule 8/02 CT abd/pelvis >> 2 low attenuation liver lesions, small hiatal hernia, enlarged prostate, striated appearance of Rt kidney 8/08 MRI liver >> liver cysts, Rt renal pyelonephritis, 4.5 cm ascending aortic aneurysm 8/12 TTE >> EF 60 to 65%, mod AR, aortic root 44 mm, ascending aorta 48 mm 8/13 MRI brain >> similar as 8/1 MRI. Numerous hemorrhagic masses c/w metastatic melanoma.   CULTURES: 8/11  Blood >> Enterococcus 8/11 Urine >> Klebsiella pneumoniae 8/12 Blood >> (-) so far  ANTIBIOTICS: 8/11 Vancomycin >> 8/11 8/11 Zosyn >> 8/11 8/12 Gentamycin >> 8/12 Ampicillin >>   SIGNIFICANT EVENTS: 8/11 Admit  LINES/TUBES:  DISCUSSION: 70 yo male with recent dx of metastatic melanoma with brain metastases.  He has recently been started on decadron, AEDs, and XRT.  He has hx of prostate enlargement and UTI's.  Imaging studies during recent admission showed concern for Rt pyelonephritis.  He returned to hospital from SNF on 8/11 with sepsis, UTI.  He was found to have bacteremia and bradycardia.  He has prior history of ascending aortic aneurysm and aortic regurgitation.  ID is concerned about endocarditis with septic emboli >> MRI brain from 8/01 was more consistent with metastatic melanoma.  Cardiology is concerned about bradycardia being related to increased intracranial pressure.  This is possible, but improvement in mental status with antibiotics/fluids for sepsis and UTI would seem to speak against significant increased intracranial pressure as cause.  ASSESSMENT / PLAN:  NEUROLOGY A: Acute encephalopathy likely related to sepsis, UTI, bacteremia. Metastatic brain lesions most likely related to melanoma. P: -  rpt brain MRI with now change from MRI on 8/1. Still c/w metastatic melanoma.  - continue decadron, keppra  INFECTION A: Sepsis. UTI, Rt pyelonephritis with Klebsiella in urine culture. Enterococcal Bactermia. P: - ABx per ID  CARDIAC A: Hx of ascending aortic aneurysm and aortic regurgitation. Bradycardia. Hx of CAD, HTN, HLD. P: - for TEE with cardiology this am.  - monitor hemodynamics - hold ASA for now pending MRI brain results - continue lipitor - hold outpt coreg, cozaar  PULMONARY A: 2.4 cm LUL nodule >> likely metastatic lesion from melanoma. P: - follow up imaging studies as outpt  RENAL A: Hypokalemia, hypocalcemia, hypomagnesemia.  Improved.  Prostate enlargement. P: - replace electrolytes as needed - continue flomax   GASTROENTEROLOGY A: Nutrition. Hx of GERD. P: - NPO for now 2/2 planned TEE.  - protonix for SUP  HEMATOLOGY A: Anemia, thrombocytopenia in setting of sepsis. P: - f/u CBC - SCD's for DVT prophylaxis  ENDOCRINOLOGY A: Steroid induced hyperglycemia. P: - SSI   Updated pt's wife and daughter at bedside.  Monica Becton, MD 12/09/2015, 10:45 AM [facility] Pager [phone] After 3 pm or if no answer, call [phone]

## 2015-12-09 NOTE — Progress Notes (Signed)
CSW following to assist with d/c planning. Pt d/c from [facility] to [facility] on 12/05/15. Pt was readmitted to [facility] on 12/06/15 with sepsis due to UTI. Please see CSW psychosocial assessment from previous  admission 12/03/15. CSW met with pt / spouse this am. Spouse reports that pt was dc from [facility] too soon. " I don't want that to happen again. " Reassurance provided. Spouse would like pt to return to [facility] ( [facility] ) when stable. SNF contacted and is willing to readmit pending bed availability. CSW will keep SNF updated and continue to follow to assist with d/c planning.  Werner Lean LCSW [phone]

## 2015-12-09 NOTE — Progress Notes (Signed)
Unable to do TEE today, I called and re scheduled for Tuesday.  Kerin Ransom PA-C 12/09/2015 12:54 PM

## 2015-12-10 ENCOUNTER — Inpatient Hospital Stay (HOSPITAL_COMMUNITY): Payer: Medicare Other

## 2015-12-10 ENCOUNTER — Encounter (HOSPITAL_COMMUNITY)
Admission: EM | Disposition: A | Discharge status: Skilled Nursing Facility | Payer: Self-pay | Source: Home / Self Care | Attending: Internal Medicine

## 2015-12-10 ENCOUNTER — Telehealth: Payer: Self-pay | Admitting: *Deleted

## 2015-12-10 ENCOUNTER — Encounter (HOSPITAL_COMMUNITY): Payer: Self-pay

## 2015-12-10 ENCOUNTER — Ambulatory Visit
Admit: 2015-12-10 | Discharge: 2015-12-10 | Disposition: A | Discharge status: Home / Self Care | Payer: Medicare Other | Attending: Radiation Oncology | Admitting: Radiation Oncology

## 2015-12-10 DIAGNOSIS — R5381 Other malaise: Secondary | ICD-10-CM

## 2015-12-10 DIAGNOSIS — I351 Nonrheumatic aortic (valve) insufficiency: Secondary | ICD-10-CM

## 2015-12-10 DIAGNOSIS — I340 Nonrheumatic mitral (valve) insufficiency: Secondary | ICD-10-CM

## 2015-12-10 DIAGNOSIS — R4182 Altered mental status, unspecified: Secondary | ICD-10-CM

## 2015-12-10 DIAGNOSIS — R400 Somnolence: Secondary | ICD-10-CM

## 2015-12-10 DIAGNOSIS — N390 Urinary tract infection, site not specified: Secondary | ICD-10-CM

## 2015-12-10 HISTORY — PX: TEE WITHOUT CARDIOVERSION: SHX5443

## 2015-12-10 LAB — CBC
HCT: 41 % (ref 39.0–52.0)
HEMOGLOBIN: 14.1 g/dL (ref 13.0–17.0)
MCH: 30 pg (ref 26.0–34.0)
MCHC: 34.4 g/dL (ref 30.0–36.0)
MCV: 87.2 fL (ref 78.0–100.0)
PLATELETS: 127 10*3/uL — AB (ref 150–400)
RBC: 4.7 MIL/uL (ref 4.22–5.81)
RDW: 13.3 % (ref 11.5–15.5)
WBC: 13 10*3/uL — AB (ref 4.0–10.5)

## 2015-12-10 LAB — BASIC METABOLIC PANEL
ANION GAP: 9 (ref 5–15)
BUN: 21 mg/dL — ABNORMAL HIGH (ref 6–20)
CALCIUM: 8.5 mg/dL — AB (ref 8.9–10.3)
CHLORIDE: 101 mmol/L (ref 101–111)
CO2: 26 mmol/L (ref 22–32)
CREATININE: 0.86 mg/dL (ref 0.61–1.24)
GFR calc Af Amer: >60 mL/min (ref >60)
Glucose, Bld: 125 mg/dL — ABNORMAL HIGH (ref 65–99)
POTASSIUM: 3.7 mmol/L (ref 3.5–5.1)
Sodium: 136 mmol/L (ref 135–145)

## 2015-12-10 LAB — HEPATITIS PANEL, ACUTE
Hep A IgM: NEGATIVE
Hep B C IgM: NEGATIVE
Hepatitis B Surface Ag: NEGATIVE

## 2015-12-10 SURGERY — ECHOCARDIOGRAM, TRANSESOPHAGEAL
Anesthesia: Moderate Sedation

## 2015-12-10 MED ORDER — MIDAZOLAM HCL 10 MG/2ML IJ SOLN
INTRAMUSCULAR | Status: DC | PRN
  Administered 2015-12-10: 1 mg via INTRAVENOUS
  Administered 2015-12-10 (×2): 2 mg via INTRAVENOUS

## 2015-12-10 MED ORDER — SODIUM CHLORIDE 0.9 % IV SOLN
INTRAVENOUS | Status: DC
  Administered 2015-12-10: 500 mL via INTRAVENOUS

## 2015-12-10 MED ORDER — FENTANYL CITRATE (PF) 100 MCG/2ML IJ SOLN
INTRAMUSCULAR | Status: DC | PRN
  Administered 2015-12-10: 12.5 ug via INTRAVENOUS
  Administered 2015-12-10: 25 ug via INTRAVENOUS

## 2015-12-10 MED ORDER — MIDAZOLAM HCL 5 MG/ML IJ SOLN
INTRAMUSCULAR | Status: AC
  Filled 2015-12-10: qty 2

## 2015-12-10 MED ORDER — BUTAMBEN-TETRACAINE-BENZOCAINE 2-2-14 % EX AERO
INHALATION_SPRAY | CUTANEOUS | Status: DC | PRN
  Administered 2015-12-10: 2 via TOPICAL

## 2015-12-10 MED ORDER — FENTANYL CITRATE (PF) 100 MCG/2ML IJ SOLN
INTRAMUSCULAR | Status: AC
  Filled 2015-12-10: qty 2

## 2015-12-10 NOTE — Progress Notes (Signed)
Complete med necessity form for patient transfer to [facility] for TEE per Dr. Rockne Menghini.

## 2015-12-10 NOTE — Progress Notes (Signed)
[place] for Infect[facility]    Date of Admission:  12/06/2015   Total days of antibiotics 6        Day 5 amp        Day 5 gent           ID: Chad Ball is a 70 y.o. male with metastatic melanoma admitted for sepsis due to enterococcal bacteremia Principal Problem:   Sepsis due to urinary tract infection ([place]) Active Problems:   Essential hypertension   Hyperlipidemia   CAD in native artery   Brain metastasis (HCC)   Metastatic melanoma (HCC)   Sepsis (HCC)   Hyperglycemia   Acute encephalopathy   Hypocalcemia   Hypoalbuminemia   Nonspecific abnormal electrocardiogram (ECG) (EKG)   Sepsis due to enterococcus (HCC)   Septic shock (HCC)   Acute endocarditis   Septic embolism (HCC)   Normocytic anemia   Hypokalemia   UTI (lower urinary tract infection)    Subjective: Afebrile, underwent TEE which was negative for vegetations. He underwent radiation this morning as well. He is somewhat groggy from procedure.   Objective : on telemetry, HR in 50s , BP WNL, asx in bed  Medications:  . ampicillin (OMNIPEN) IV  2 g Intravenous Q4H  . antiseptic oral rinse  7 mL Mouth Rinse BID  . atorvastatin  80 mg Oral QPM  . calcium carbonate (dosed in mg elemental calcium)  500 mg of elemental calcium Oral TID  . dexamethasone  4 mg Oral QID  . feeding supplement (ENSURE ENLIVE)  237 mL Oral TID BM  . gentamicin  120 mg Intravenous Q12H  . levETIRAcetam  750 mg Intravenous Q12H  . pantoprazole  40 mg Oral Daily  . sodium chloride flush  3 mL Intravenous Q12H  . tamsulosin  0.4 mg Oral Daily    Objective: Vital signs in last 24 hours: Temp:  [97.4 F (36.3 C)-98.4 F (36.9 C)] 97.4 F (36.3 C) (08/15 1420) Pulse Rate:  [46-103] 51 (08/15 1600) Resp:  [12-21] 18 (08/15 1600) BP: (114-170)/(68-93) 138/85 (08/15 1600) SpO2:  [91 %-100 %] 95 % (08/15 1600) Weight:  [165 lb 5.5 oz (75 kg)] 165 lb 5.5 oz (75 kg) (08/15 0329) Physical Exam  Constitutional: He is  oriented to person,sleepy. He appears frail, chronically ill. No distress.  HENT:   Cardiovascular: brady, regular rhythm and normal heart sounds. Exam reveals no gallop and no friction rub.  No murmur heard.  Pulmonary/Chest: Effort normal and breath sounds normal. No respiratory distress. He has no wheezes.  Abdominal: Soft. Bowel sounds are normal. He exhibits no distension. There is no tenderness.  Lymphadenopathy:  He has no cervical adenopathy.  Neurological: He is alert and oriented to person, place, and time.  Skin: Skin is warm and dry. No rash noted. No erythema.  Psychiatric: He has a normal mood and affect. His behavior is normal.     Lab Results  Recent Labs  12/09/15 0400 12/10/15 0329  WBC 9.7 13.0*  HGB 12.4* 14.1  HCT 35.8* 41.0  NA 136 136  K 3.5 3.7  CL 104 101  CO2 25 26  BUN 17 21*  CREATININE 0.68 0.86   Sedimentation Rate  Recent Labs  12/08/15 0240  ESRSEDRATE 2   C-Reactive Protein  Recent Labs  12/08/15 0240  CRP 3.8*    Microbiology: 8/12 blood cx ngtd 8/11 blood cx enterococcus amp S 8/11 urine cx klebsiella /amp R, amp/sub S, gent s  Studies/Results: Mr Chad Ball Wo Contrast  Result Date: 12/08/2015 CLINICAL DATA:  History of metastatic melanoma with multiple brain lesions. Treated with steroids. Preadmission with possible sepsis 2 days ago. EXAM: MRI HEAD WITHOUT AND WITH CONTRAST TECHNIQUE: Multiplanar, multiecho pulse sequences of the brain and surrounding structures were obtained without and with intravenous contrast. CONTRAST:  38mL MULTIHANCE GADOBENATE DIMEGLUMINE 529 MG/ML IV SOLN COMPARISON:  11/26/2011 FINDINGS: As demonstrated previously, there are multiple infra and supratentorial brain masses with associated hemorrhage consistent with metastatic melanoma with associated hemorrhage. There has been evolutionary maturation of the associated blood products, but there are no newly seen lesions, larger lesions or new hemorrhage.  Mass effect is most prominent in the right frontal lobe with the largest lesion measures 5 cm in diameter and is associated with vasogenic edema. Second largest lesion in the left temporal lobe is also similar, with associated vasogenic edema. Ventricular size is stable. No residual layering blood in the ventricles. No extra-axial collection. No evidence of new ischemic infarction. Major vessels at the base of the brain show flow. IMPRESSION: Multiple hemorrhagic metastatic melanoma lesions, approximately 25 in number. No change in the size, number or mass effect of the lesions. Hemorrhagic components are undergoing expected maturational changes in signal characteristics. Mass effect and edema associated with the 2 largest lesions, in the right frontal brain and in the left temporal brain appear quite similar. Electronically Signed   By: Nelson Chimes M.D.   On: 12/08/2015 18:30   Assessment/Plan:  Enterococcal bacteremia = currently on ampicillin and gentamicin dual therapy to cover for possible endocarditis. TEE is negative for vegetation. CNS lesions are not c/w septic emboli but more with multiple hemorrhagic mets from underlying metastatic melanoma. Repeat blood cx on 8/12 are NGTD. recommend to treat for 2 wk only, monotherapy with ampicillin. Will d/c gent after today's dose  Bradycardia = continue on telemetry. Once more alert, maybe of value to see if symptomatic when he is ambulating with assistance  +urine culture klebsiella = patient is likely colonized with klebsiella. Poor collection since many squamous cells in urine specimen. His current abtx regimen would cover uropathogen  Cns lesion c/w melanoma mets. Continue with decadron to minimize edema. Plan is to continue with whole brain radiation prior to seeing Dr. Inda Ball. Defer to dr. Lisbeth Ball for further schedule of session  Deconditioning = patient will likely need home health since wife is unsure if she can do all his needs.  Chad Ball  [facility] Cell: [phone] Pager: [phone]  12/10/2015, 4:18 PM

## 2015-12-10 NOTE — Progress Notes (Signed)
Echocardiogram Echocardiogram Transesophageal has been performed.  Chad Ball 12/10/2015, 2:36 PM

## 2015-12-10 NOTE — Progress Notes (Addendum)
Hospital Problem List     Principal Problem:   Sepsis due to urinary tract infection ([place]) Active Problems:   Essential hypertension   Hyperlipidemia   CAD in native artery   Brain metastasis (HCC)   Metastatic melanoma (HCC)   Sepsis (HCC)   Hyperglycemia   Acute encephalopathy   Hypocalcemia   Hypoalbuminemia   Nonspecific abnormal electrocardiogram (ECG) (EKG)   Sepsis due to enterococcus (HCC)   Septic shock (HCC)   Acute endocarditis   Septic embolism (HCC)   Normocytic anemia   Hypokalemia    Patient Profile:   Primary Cardiologist: New - Dr. Tamala Julian  70 yo male w/ PMH of CAD (s/p PCI), GERD, and melanoma (now with metastatic brain lesions and possible lung metastasis) admitted for sepsis. Cards initially consulted for bradycardia on admission.   Subjective   Asleep this AM. Wife at the bedside. No remaining questions or concerns regarding TEE this afternoon. She remains concerned about his episodes of confusion this admission. Upset about his declining conditioning since retiring in May of this year.   Inpatient Medications    . ampicillin (OMNIPEN) IV  2 g Intravenous Q4H  . antiseptic oral rinse  7 mL Mouth Rinse BID  . atorvastatin  80 mg Oral QPM  . calcium carbonate (dosed in mg elemental calcium)  500 mg of elemental calcium Oral TID  . dexamethasone  4 mg Oral QID  . feeding supplement (ENSURE ENLIVE)  237 mL Oral TID BM  . gentamicin  120 mg Intravenous Q12H  . levETIRAcetam  750 mg Intravenous Q12H  . pantoprazole  40 mg Oral Daily  . sodium chloride flush  3 mL Intravenous Q12H  . tamsulosin  0.4 mg Oral Daily    Vital Signs    Vitals:   12/10/15 0400 12/10/15 0426 12/10/15 0500 12/10/15 0600  BP: 140/78  124/68 140/82  Pulse:   (!) 103   Resp: (!) 21  18 16   Temp:  98.4 F (36.9 C)    TempSrc:  Oral    SpO2:      Weight:      Height:        Intake/Output Summary (Last 24 hours) at 12/10/15 0759 Last data filed at 12/10/15 0535  Gross per 24 hour  Intake            931.5 ml  Output             3301 ml  Net          -2369.5 ml   Filed Weights   12/08/15 0445 12/09/15 0444 12/10/15 0329  Weight: 181 lb 14.1 oz (82.5 kg) 174 lb 6.1 oz (79.1 kg) 165 lb 5.5 oz (75 kg)    Physical Exam    General: Thin-appearing Caucasian, male currently in no acute distress. Head: Normocephalic, atraumatic.  Neck: Supple without bruits, JVD not elevated. Lungs:  Resp regular and unlabored, CTA without wheezing or rales. Heart: RRR, S1, S2, no S3, S4, or murmur; no rub. Abdomen: Soft, non-tender, non-distended with normoactive bowel sounds. No hepatomegaly. No rebound/guarding. No obvious abdominal masses. Extremities: No clubbing, cyanosis, or edema. Distal pedal pulses are 2+ bilaterally. Neuro: Alert and oriented X 3. Moves all extremities spontaneously. Psych: Normal affect.  Labs    CBC  Recent Labs  12/09/15 0400 12/10/15 0329  WBC 9.7 13.0*  HGB 12.4* 14.1  HCT 35.8* 41.0  MCV 87.3 87.2  PLT 102* [id number]*   Basic Metabolic Panel  Recent Labs  12/07/15 1147  12/09/15 0400 12/10/15 0329  NA  --   < > 136 136  K  --   < > 3.5 3.7  CL  --   < > 104 101  CO2  --   < > 25 26  GLUCOSE  --   < > 120* 125*  BUN  --   < > 17 21*  CREATININE  --   < > 0.68 0.86  CALCIUM  --   < > 8.8* 8.5*  MG 1.6*  --   --   --   PHOS 2.9  --   --   --   < > = values in this interval not displayed. Liver Function Tests No results for input(s): AST, ALT, ALKPHOS, BILITOT, PROT, ALBUMIN in the last 72 hours. No results for input(s): LIPASE, AMYLASE in the last 72 hours. Cardiac Enzymes  Recent Labs  12/08/15 0838 12/08/15 1509 12/08/15 2124  TROPONINI 0.07* 0.07* 0.07*   Thyroid Function Tests  Recent Labs  12/07/15 1147  TSH 0.228*    Telemetry    Sinus bradycardia, HR in mid-40's - 60's.   ECG    No new tracings.    Cardiac Studies and Radiology    Dg Chest 2 View  Result Date: 12/06/2015 CLINICAL  DATA:  Fever; possible sepsis; chemo yesterday for brain ca EXAM: CHEST  2 VIEW COMPARISON:  Chest CT, 11/27/2015 FINDINGS: Left upper lobe mass without significant change from the prior study. Remainder of the lungs is clear. No pleural effusion or pneumothorax. Cardiac silhouette is normal in size. No mediastinal or hilar masses or evidence of adenopathy. Bony thorax is intact. IMPRESSION: No acute cardiopulmonary disease. Electronically Signed   By: Lajean Manes M.D.   On: 12/06/2015 15:07   Ct Chest W Contrast  Result Date: 11/27/2015 CLINICAL DATA:  Metastatic melanoma. EXAM: CT CHEST, ABDOMEN, AND PELVIS WITH CONTRAST TECHNIQUE: Multidetector CT imaging of the chest, abdomen and pelvis was performed following the standard protocol during bolus administration of intravenous contrast. CONTRAST:  121mL ISOVUE-300 IOPAMIDOL (ISOVUE-300) INJECTION 61% COMPARISON:  None. FINDINGS: CT CHEST FINDINGS Mediastinum/Lymph Nodes: The heart size appears normal. No pericardial effusion. Aortic atherosclerosis noted. LAD coronary artery calcification is identified. The trachea appears patent and is midline. Unremarkable appearance of the esophagus. No enlarged mediastinal, axillary or supraclavicular lymph nodes. Lungs/Pleura: No pleural effusion identified. Pulmonary nodule in the left upper lobe measures 2.4 cm, image 51 of series 4. Scarring noted within both lower lobes. Musculoskeletal: No chest wall mass or suspicious bone lesions identified. CT ABDOMEN PELVIS FINDINGS Hepatobiliary: There are 2 low-attenuation foci within the liver. The largest measure 9 mm. These are too small to reliably characterize. The gallbladder appears normal. No biliary dilatation. Pancreas: No mass, inflammatory changes, or other significant abnormality. Spleen: Within normal limits in size and appearance. Adrenals/Urinary Tract: Normal appearance of the adrenal glands. Striated nephrographic appearance of the right kidney is identified,  image number 18 of series 8. The urinary bladder appears normal. Stomach/Bowel: Small hiatal hernia. There is no pathologic dilatation of the small bowel loops. Numerous colonic diverticula are identified. No acute inflammation. Vascular/Lymphatic: Calcified atherosclerotic disease involves the abdominal aorta. No aneurysm. No enlarged upper abdominal lymph nodes identified. No pelvic or inguinal adenopathy. Reproductive: Prostate gland enlargement noted. Other: There is no ascites or focal fluid collections within the abdomen or pelvis. Musculoskeletal:  No aggressive lytic or sclerotic bone lesions. IMPRESSION: 1. Pulmonary nodule within the left  upper lobe is identified and suspicious for metastases. 2. No specific findings identified to suggest metastatic disease to the abdomen or pelvis. 3. Striated nephrographic appearance of the right kidney. This is a nonspecific finding and may be seen with pyelonephritis as well as vascular abnormality such as embolic phenomenon. Clinical correlation suggested. Electronically Signed   By: Kerby Moors M.D.   On: 11/27/2015 17:46   Mr [name] [id number] Contrast  Result Date: 12/08/2015 CLINICAL DATA:  History of metastatic melanoma with multiple brain lesions. Treated with steroids. Preadmission with possible sepsis 2 days ago. EXAM: MRI HEAD WITHOUT AND WITH CONTRAST TECHNIQUE: Multiplanar, multiecho pulse sequences of the brain and surrounding structures were obtained without and with intravenous contrast. CONTRAST:  45mL MULTIHANCE GADOBENATE DIMEGLUMINE 529 MG/ML IV SOLN COMPARISON:  11/26/2011 FINDINGS: As demonstrated previously, there are multiple infra and supratentorial brain masses with associated hemorrhage consistent with metastatic melanoma with associated hemorrhage. There has been evolutionary maturation of the associated blood products, but there are no newly seen lesions, larger lesions or new hemorrhage. Mass effect is most prominent in the right frontal  lobe with the largest lesion measures 5 cm in diameter and is associated with vasogenic edema. Second largest lesion in the left temporal lobe is also similar, with associated vasogenic edema. Ventricular size is stable. No residual layering blood in the ventricles. No extra-axial collection. No evidence of new ischemic infarction. Major vessels at the base of the brain show flow. IMPRESSION: Multiple hemorrhagic metastatic melanoma lesions, approximately 25 in number. No change in the size, number or mass effect of the lesions. Hemorrhagic components are undergoing expected maturational changes in signal characteristics. Mass effect and edema associated with the 2 largest lesions, in the right frontal brain and in the left temporal brain appear quite similar. Electronically Signed   By: Nelson Chimes M.D.   On: 12/08/2015 18:30   Mr [name] [id number] Contrast  Result Date: 11/26/2015 CLINICAL DATA:  70 year old male with speech difficulty and imbalance. Symptoms started 1 week ago complaining of headache. History melanoma removed 1 month ago. Abnormal imaging at [facility] in [place]. Initial encounter. EXAM: MRI HEAD WITHOUT AND WITH CONTRAST TECHNIQUE: Multiplanar, multiecho pulse sequences of the brain and surrounding structures were obtained without and with intravenous contrast. CONTRAST:  86mL MULTIHANCE GADOBENATE DIMEGLUMINE 529 MG/ML IV SOLN COMPARISON:  No comparison exams available. FINDINGS: Exam was performed under general anesthesia. Numerous intracranial masses within the supratentorial and infratentorial region numbering approximately 25 containing blood breakdown products or melanin suggestive of metastatic melanoma given patient's history. Some of the larger lesions include: Right anterior frontal 4.8 x 4.9 x 5 cm mass with marked surrounding vasogenic edema. Cannot exclude extension into the superior right orbital region as bone of the orbital roof may be destroyed. This causes  significant mass effect upon the right frontal horn of the lateral ventricular system which is displaced posteriorly and to the right. Left temporal lobe 4.8 x 3.2 x 3.1 cm mass with marked surrounding vasogenic edema and flattening of the left temporal horn. Superior right cerebellar 1.8 cm and 1.4 cm mass with surrounding vasogenic edema and mild compression the right lateral aspect of the fourth ventricle. Blood or proteinaceous material layering within the deep dependent aspect of the lateral ventricles suggesting breakthrough of hemorrhagic metastatic lesion into the ventricular system. Primary intracranial hemorrhage is a secondary less likely consideration. Abnormal appearance of the sulci on FLAIR imaging may be related to oxygen saturation secondary to the fact this  was performed under general anesthesia limiting detection of the possibility of subarachnoid hemorrhage. In addition to significant distortion of the lateral ventricle by metastatic disease, mild hydrocephalus may be present. Major intracranial vascular structures are patent. No acute thrombotic infarct. IMPRESSION: Numerous intracranial masses within the supratentorial and infratentorial region numbering approximately 25 containing blood breakdown products or melanin suggestive of metastatic melanoma given patient's history. Some of the larger lesions include: Right anterior frontal 4.8 x 4.9 x 5 cm mass with marked surrounding vasogenic edema. Cannot exclude extension into the superior right orbital region as bone of the orbital roof may be destroyed. This causes significant mass effect upon the right frontal horn of the lateral ventricular system which is displaced posteriorly and to the right. Left temporal lobe 4.8 x 3.2 x 3.1 cm mass with marked surrounding vasogenic edema and flattening of the left temporal horn. Superior right cerebellar 1.8 cm and 1.4 cm mass with surrounding vasogenic edema and mild compression the right lateral aspect  of the fourth ventricle. Blood or proteinaceous material layering within the deep dependent aspect of the lateral ventricles suggesting breakthrough of hemorrhagic metastatic lesion into the ventricular system. Primary intracranial hemorrhage is a secondary less likely consideration. Abnormal appearance of the sulci on FLAIR imaging may be related to oxygen saturation secondary to the fact this was performed under general anesthesia limiting detection of the possibility of subarachnoid hemorrhage. In addition to significant distortion of the lateral ventricle by metastatic disease, mild hydrocephalus may be present. These results will be called to the ordering clinician or representative by the Radiologist Assistant, and communication documented in the PACS or zVision Dashboard. Electronically Signed   By: Genia Del M.D.   On: 11/26/2015 15:32   Ct Abdomen Pelvis W Contrast  Result Date: 11/27/2015 CLINICAL DATA:  Metastatic melanoma. EXAM: CT CHEST, ABDOMEN, AND PELVIS WITH CONTRAST TECHNIQUE: Multidetector CT imaging of the chest, abdomen and pelvis was performed following the standard protocol during bolus administration of intravenous contrast. CONTRAST:  131mL ISOVUE-300 IOPAMIDOL (ISOVUE-300) INJECTION 61% COMPARISON:  None. FINDINGS: CT CHEST FINDINGS Mediastinum/Lymph Nodes: The heart size appears normal. No pericardial effusion. Aortic atherosclerosis noted. LAD coronary artery calcification is identified. The trachea appears patent and is midline. Unremarkable appearance of the esophagus. No enlarged mediastinal, axillary or supraclavicular lymph nodes. Lungs/Pleura: No pleural effusion identified. Pulmonary nodule in the left upper lobe measures 2.4 cm, image 51 of series 4. Scarring noted within both lower lobes. Musculoskeletal: No chest wall mass or suspicious bone lesions identified. CT ABDOMEN PELVIS FINDINGS Hepatobiliary: There are 2 low-attenuation foci within the liver. The largest measure  9 mm. These are too small to reliably characterize. The gallbladder appears normal. No biliary dilatation. Pancreas: No mass, inflammatory changes, or other significant abnormality. Spleen: Within normal limits in size and appearance. Adrenals/Urinary Tract: Normal appearance of the adrenal glands. Striated nephrographic appearance of the right kidney is identified, image number 18 of series 8. The urinary bladder appears normal. Stomach/Bowel: Small hiatal hernia. There is no pathologic dilatation of the small bowel loops. Numerous colonic diverticula are identified. No acute inflammation. Vascular/Lymphatic: Calcified atherosclerotic disease involves the abdominal aorta. No aneurysm. No enlarged upper abdominal lymph nodes identified. No pelvic or inguinal adenopathy. Reproductive: Prostate gland enlargement noted. Other: There is no ascites or focal fluid collections within the abdomen or pelvis. Musculoskeletal:  No aggressive lytic or sclerotic bone lesions. IMPRESSION: 1. Pulmonary nodule within the left upper lobe is identified and suspicious for metastases. 2. No specific findings  identified to suggest metastatic disease to the abdomen or pelvis. 3. Striated nephrographic appearance of the right kidney. This is a nonspecific finding and may be seen with pyelonephritis as well as vascular abnormality such as embolic phenomenon. Clinical correlation suggested. Electronically Signed   By: Kerby Moors M.D.   On: 11/27/2015 17:46   Mr Liver W [id number] Contrast  Result Date: 12/03/2015 CLINICAL DATA:  Metastatic melanoma.  Evaluate liver lesion. EXAM: MRI ABDOMEN WITHOUT AND WITH CONTRAST TECHNIQUE: Multiplanar multisequence MR imaging of the abdomen was performed both before and after the administration of intravenous contrast. CONTRAST:  16 cc MultiHance COMPARISON:  CT 11/27/2015 FINDINGS: Moderate motion degradation throughout. The initial series, including the TT respiratory triggered axials, are of moderate  to good quality. The later series, including the pre and postcontrast dynamic, are severely motion degraded and essentially nondiagnostic. Lower chest: Normal heart size without pericardial or pleural effusion. Incidental note is made of ascending aortic dilatation, including at 4.5 cm on image 2/series 1802. Hepatobiliary: The 2 left hepatic lobe lesions are markedly T2 hyperintense on image 16/ series 3 and demonstrate no significant post-contrast enhancement. Most consistent with simple cysts. Larger is in the lateral segment left liver lobe at 8 mm. No suspicious liver lesion. Normal gallbladder, without biliary ductal dilatation. Pancreas: Normal, without mass or ductal dilatation. Spleen: Normal in size, without focal abnormality. Adrenals/Urinary Tract: Normal adrenal glands. Tiny bilateral renal lesions which are likely cysts. Heterogeneous T2 signal within the interpolar right kidney, including wedge-shaped area of T2 hypo intensity on image 28/ series 3. Concurrent hypo enhancement. No hydronephrosis. Stomach/Bowel: Small hiatal hernia. Otherwise normal stomach and abdominal bowel loops. Vascular/Lymphatic: Normal caliber of the aorta and branch vessels. Retroaortic left renal vein. Right renal vein grossly patent. No retroperitoneal or retrocrural adenopathy. Other: No ascites. Musculoskeletal: No acute osseous abnormality. IMPRESSION: 1. Moderate motion degradation. 2. The hepatic lesions are consistent with cysts. No evidence of hepatic metastasis. 3. Right renal abnormality is favored to represent pyelonephritis. Correlate with urinalysis. 4. Ascending aortic aneurysm, on the order of 4.5 cm. Recommend semi-annual imaging followup by CTA or MRA and referral to cardiothoracic surgery if not already obtained. This recommendation follows 2010 ACCF/AHA/AATS/ACR/ASA/SCA/SCAI/SIR/STS/SVM Guidelines for the Diagnosis and Management of Patients With Thoracic Aortic Disease. Circulation. 2010; 121SP:1689793  Electronically Signed   By: Abigail Miyamoto M.D.   On: 12/03/2015 08:25    Echocardiogram: 12/07/2015 Study Conclusions  - Left ventricle: The cavity size was normal. Wall thickness was   normal. Systolic function was normal. The estimated ejection   fraction was in the range of 60% to 65%. Wall motion was normal;   there were no regional wall motion abnormalities. Doppler   parameters are consistent with abnormal left ventricular   relaxation (grade 1 diastolic dysfunction). - Aortic valve: There was moderate regurgitation. Valve area (VTI):   2.41 cm^2. Valve area (Vmax): 2.27 cm^2. Valve area (Vmean): 2.18   cm^2. - Aorta: Aortic root dimension: 44 mm (ED). Ascending aortic   diameter: 48 mm (S). - Aortic root: The aortic root was mildly dilated. - Ascending aorta: The ascending aorta was moderately dilated. - Left atrium: The atrium was mildly dilated. - Technically difficult study. If high suspicion of valvular   endocarditis consider TEE.  Assessment & Plan    1. Sinus Bradycardia - HR in the low-40's on admission. Poor PPM candidate at this time secondary to infection.  - no Atropine required since 12/08/2015. HR improved to 60's this AM.  Continue to avoid AV nodal blocking agents.   2. Ascending Aortic Aneurysm - echo this admission showing Aortic root dimension: 44 mm (ED). Ascending aortic diameter: 48 mm (S).  3. CAD - denies any recent anginal symptoms. Troponin values flat at 0.07 this admission. - continue statin therapy. ASA stopped secondary to thrombocytopenia (platelet count 78K on 8/13). No BB secondary to bradycardia.    4.  Enteroccoccal bacteremia and possible endocarditis   - ID following - for TEE today at 1400. Risks and benefits have been explained and the patient and his wife agree to proceed. Has been NPO since midnight.   5. Metastatic Melanoma - now with metastatic brain lesions and possible lung mets.   Signed, Erma Heritage , PA-C 7:59  AM 12/10/2015 Pager: [phone]  Procedure: Transesophageal Echocardiogram Indications: bacteremia  Procedure Details Consent: Obtained Time Out: Verified patient identification, verified procedure, site/side was marked, verified correct patient position, special equipment/implants available, Radiology Safety Procedures followed,  medications/allergies/relevent history reviewed, required imaging and test results available.  Performed  Medications:  During this procedure the patient is administered a total of Versed 4 mg and Fentanyl 37.5 mcg  to achieve and maintain moderate conscious sedation.  The patient's heart rate, blood pressure, and oxygen saturation are monitored continuously during the procedure. The period of conscious sedation is 30 minutes, of which I was present face-to-face 100% of this time.  Left Ventrical:  Normal LV function  Mitral Valve: normal , no vegetation.   Mild MR  Aortic Valve: mild - moderate AI,  No vegetation  Tricuspid Valve: no vegetation,   Trace TR   Pulmonic Valve: normal , no vegetation  Left Atrium/ Left atrial appendage: no thrombi   Atrial septum: no PFO or ASD by color doppler   Aorta:  Dilated aortic root and ascending aorta    Complications: No apparent complications Patient did tolerate procedure well.  Patient seen and examined. Agree with assessment and plan. Just back from TEE; results reviewed as above. No vegetations.   Currently sleeping. Sinus bradycardia at 48 with BP 136/76.   Troy Sine, MD, [facility] 12/10/2015 3:54 PM

## 2015-12-10 NOTE — Progress Notes (Signed)
Progress Note    Chad Ball  [id number] DOB: 10-Oct-1945  DOA: 12/06/2015 PCP: [name], MD    Brief Narrative:   Chad Ball is an 69 y.o. 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 Workup in the ED was consistent with sepsis from a UTI.  Assessment/Plan:   Principal Problem:   Enterococcal sepsis due to complicated urinary tract infection ([place]) complicated by acute encephalopathy, rule out endocarditis Patient was admitted as inpatient due to intensity of service and high risk for decompensation. He has a chronic indwelling Foley with evidence of urinary retention (bladder scan performed with 100 mL of residual urine noted). Continue Flomax.Blood pressure has improved, lactate has cleared and his WBC count is improving. Broad-spectrum antibiotics with vancomycin/Zosyn were initiated in the ED. His antibiotics were changed to ampicillin and gentamicin by ID. Since he is chronically on Decadron for his brain metastasis, stress dose steroids were initiated as well, But now back on oral Decadron which has better bioavailability in the brain. Blood cultures are positive for enterococcus. ID recommends TEE to rule out endocarditis. TTE done but was a poor study to rule out vegetations, so TEE scheduled for 12/10/15. Follow-up results.    Klebsiella UTI Ampicillin resistant, but sensitive to gentamycin.  Discussed with Dr. Tommy Medal who favors not broadening ampicillin to Unasyn.    Active Problems:   Normocytic anemia Hemoglobin currently stable.    Hypokalemia  We'll replete.    Hypocalcemia/hypoalbuminemia  Continue calcium  carbonate supplements.    Sinus bradycardia and demand ischemia TSH low, so hypothyroidism not likely the cause of his bradycardia.  Continue to replace calcium as low calcium can be associated with heart rhythm abnormalities. Atropine ordered as needed.12-lead EKG showed anterior T-wave inversions. Troponins elevated with downward trend. Cardiology following with thought that patient's bradycardia might be related to increased intracranial hypertension.    Hyperglycemia/prediabetes Likely steroid-induced. Hemoglobin A1c is 6%, consistent with prediabetes.    Essential hypertension Blood pressure stable, now off IV fluids.    Hyperlipidemia/CAD in native artery/abnormal EKG Continue aspirin and Lipitor. Troponins elevated but trend is down. Cardiology following. ST & T wave abnormalities on EKG worrisome for anterolateral ischemia. 2-D echo did not show any evidence of regional wall motion abnormalities.    Metastatic melanoma (HCC)/brain metastasis/suspected increased intracranial pressure Continue Decadron and Keppra. Have discussed with neurologist, Dr. Tasia Catchings, (thinks brain lesions are more consistent with metastatic melanoma rather than septic emboli) and Dr. Lisbeth Renshaw.  I have spoke with Dr. Kathyrn Sheriff who feels that the patient's brain masses are MOST consistent with metastasis given the the presence of blood products near the metastasis, which is a fairly common feature of melanoma involving the brain. At this point, would continue Decadron and proceed with radiation therapy as planned. Consider palliative care consultation given prognosis (family is still coping with new diagnosis is not yet ready for palliative discussion, prefers to discuss with oncologist). Spoke with Dr. Lisbeth Renshaw who will see the patient in the hospital.  Family Communication/Anticipated D/C date and plan/Code Status   DVT prophylaxis: SCDs ordered. Code Status: Full Code.  Family Communication: Wife updated at bedside  12/09/15. Disposition  Plan: From SNF.   Medical Consultants:    Cardiology  Infectious Disease  Critical Care  Radiation Oncology   Procedures:    2-D echocardiogram 12/07/15  Study Conclusions  - Left ventricle: The cavity size was normal. Wall thickness was   normal. Systolic function was normal. The estimated ejection   fraction was in the range of 60% to 65%. Wall motion was normal;   there were no regional wall motion abnormalities. Doppler   parameters are consistent with abnormal left ventricular   relaxation (grade 1 diastolic dysfunction). - Aortic valve: There was moderate regurgitation. Valve area (VTI):   2.41 cm^2. Valve area (Vmax): 2.27 cm^2. Valve area (Vmean): 2.18   cm^2. - Aorta: Aortic root dimension: 44 mm (ED). Ascending aortic   diameter: 48 mm (S). - Aortic root: The aortic root was mildly dilated. - Ascending aorta: The ascending aorta was moderately dilated. - Left atrium: The atrium was mildly dilated. - Technically difficult study. If high suspicion of valvular   endocarditis consider TEE.  Anti-Infectives:   Vancomycin 12/06/15--->12/07/15 Zosyn 12/06/15---> 12/07/15 Ampicillin 12/07/15---> Gentamicin 12/07/15--->  Subjective:   Chad Ball is without any significant changes.  He still reports a mild headache, but no nausea, vomiting, or dyspnea.   Objective:    Vitals:   12/10/15 0400 12/10/15 0426 12/10/15 0500 12/10/15 0600  BP: 140/78  124/68 140/82  Pulse:   (!) 103   Resp: (!) 21  18 16   Temp:  98.4 F (36.9 C)    TempSrc:  Oral    SpO2:      Weight:      Height:        Intake/Output Summary (Last 24 hours) at 12/10/15 0854 Last data filed at 12/10/15 0535  Gross per 24 hour  Intake            837.1 ml  Output             3300 ml  Net          -2462.9 ml   Filed Weights   12/08/15 0445 12/09/15 0444 12/10/15 0329  Weight: 82.5 kg (181 lb 14.1 oz) 79.1 kg (174 lb 6.1 oz) 75 kg (165 lb 5.5 oz)     Exam: General exam: Appears calm and comfortable, but is a bit lethargic. Respiratory system: Clear to auscultation. Respiratory effort normal. Cardiovascular system: S1 & S2 heard, bradycardic. No JVD,  rubs, gallops or clicks. No murmurs. Gastrointestinal system: Abdomen is nondistended, soft and nontender. No organomegaly or masses felt. Normal bowel sounds heard. Central nervous system: Lethargic. No focal neurological deficits. Extremities: No clubbing,  or cyanosis. No edema. Skin: No rashes, lesions or ulcers. Psychiatry: Judgement and insight appear normal. Mood & affect appropriate.   Data Reviewed:   I have personally reviewed following labs and imaging studies:  Labs: Basic Metabolic Panel:  Recent Labs Lab 12/07/15 0413 12/07/15 1147 12/07/15 2317 12/08/15 0240 12/09/15 0400 12/10/15 0329  NA 136  --  137 135 136 136  K 3.7  --  3.4* 3.3* 3.5 3.7  CL 112*  --  111 111 104 101  CO2 20*  --  22 22 25 26   GLUCOSE 105*  --  118* 99 120* 125*  BUN 24*  --  21* 22* 17 21*  CREATININE 0.71  --  0.66 0.56* 0.68 0.86  CALCIUM 6.8*  --  7.1* 7.1* 8.8* 8.5*  MG  --  1.6*  --   --   --   --  PHOS  --  2.9  --   --   --   --    GFR Estimated Creatinine Clearance: 86 mL/min (by C-G formula based on SCr of 0.86 mg/dL). Liver Function Tests:  Recent Labs Lab 12/06/15 1436  AST 34  ALT 30  ALKPHOS 95  BILITOT 1.2  PROT 6.1*  ALBUMIN 2.6*   Coagulation profile  Recent Labs Lab 12/06/15 1800  INR 1.17    CBC:  Recent Labs Lab 12/06/15 1436 12/07/15 0413 12/08/15 0240 12/09/15 0400 12/10/15 0329  WBC 20.3* 13.9* 10.8* 9.7 13.0*  NEUTROABS 17.5*  --   --   --   --   HGB 15.7 12.3* 10.8* 12.4* 14.1  HCT 44.6 36.0* 31.2* 35.8* 41.0  MCV 88.0 89.6 88.1 87.3 87.2  PLT 115* 83* 78* 102* 127*   Cardiac Enzymes:  Recent Labs Lab 12/07/15 1828 12/07/15 2317 12/08/15 0838 12/08/15 1509 12/08/15 2124  TROPONINI 0.12* 0.09* 0.07* 0.07* 0.07*    BNP: Invalid input(s): POCBNP CBG:  Recent Labs Lab 12/04/15 1221 12/04/15 1757 12/04/15 2153 12/05/15 0750 12/05/15 1142  GLUCAP 173* 142* 170* 129* 150*   D-Dimer No results for input(s): DDIMER in the last 72 hours. Hgb A1c No results for input(s): HGBA1C in the last 72 hours. Lipid Profile No results for input(s): CHOL, HDL, LDLCALC, TRIG, CHOLHDL, LDLDIRECT in the last 72 hours. Thyroid function studies  Recent Labs  12/07/15 1147  TSH 0.228*   Anemia work up No results for input(s): VITAMINB12, FOLATE, FERRITIN, TIBC, IRON, RETICCTPCT in the last 72 hours. Sepsis Labs Invalid input(s): PROCALCITONIN,  WBC,  LACTICIDVEN Microbiology Recent Results (from the past 240 hour(s))  Blood Culture (routine x 2)     Status: Abnormal   Collection Time: 12/06/15  1:33 PM  Result Value Ref Range Status   Specimen Description BLOOD LEFT HAND  Final   Special Requests BOTTLES DRAWN AEROBIC AND ANAEROBIC 5CC  Final   Culture  Setup Time   Final    GRAM POSITIVE COCCI IN CHAINS IN PAIRS IN BOTH AEROBIC AND ANAEROBIC BOTTLES CRITICAL RESULT CALLED TO, READ BACK BY AND VERIFIED WITH: L POINDEXTER PHARMD 0527 12/07/15 A BROWNING    Culture (A)  Final    ENTEROCOCCUS SPECIES SUSCEPTIBILITIES PERFORMED ON PREVIOUS CULTURE WITHIN THE LAST 5 DAYS. Performed at [facility]    Report Status 12/09/2015 FINAL  Final  Urine culture     Status: Abnormal   Collection Time: 12/06/15  2:00 PM  Result Value Ref Range Status   Specimen Description URINE, CLEAN CATCH  Final   Special Requests NONE  Final   Culture >=100,000 COLONIES/mL KLEBSIELLA PNEUMONIAE (A)  Final   Report Status 12/08/2015 FINAL  Final   Organism ID, Bacteria KLEBSIELLA PNEUMONIAE (A)  Final      Susceptibility   Klebsiella pneumoniae - MIC*    AMPICILLIN >=32 RESISTANT Resistant     CEFAZOLIN <=4 SENSITIVE Sensitive     CEFTRIAXONE <=1 SENSITIVE Sensitive     CIPROFLOXACIN <=0.25 SENSITIVE Sensitive      GENTAMICIN <=1 SENSITIVE Sensitive     IMIPENEM <=0.25 SENSITIVE Sensitive     NITROFURANTOIN 64 INTERMEDIATE Intermediate     TRIMETH/SULFA <=20 SENSITIVE Sensitive     AMPICILLIN/SULBACTAM 4 SENSITIVE Sensitive     PIP/TAZO <=4 SENSITIVE Sensitive     Extended ESBL NEGATIVE Sensitive     * >=100,000 COLONIES/mL KLEBSIELLA PNEUMONIAE  Blood Culture (routine x 2)     Status: Abnormal  Collection Time: 12/06/15  2:37 PM  Result Value Ref Range Status   Specimen Description BLOOD RIGHT HAND  Final   Special Requests BOTTLES DRAWN AEROBIC AND ANAEROBIC 5 CC EA  Final   Culture  Setup Time   Final    GRAM POSITIVE COCCI IN CHAINS IN PAIRS IN BOTH AEROBIC AND ANAEROBIC BOTTLES CRITICAL RESULT CALLED TO, READ BACK BY AND VERIFIED WITHElenore Paddy PHARMD [id number] 12/07/15 A BROWNING Performed at [place] (A)  Final   Report Status 12/09/2015 FINAL  Final   Organism ID, Bacteria ENTEROCOCCUS SPECIES  Final      Susceptibility   Enterococcus species - MIC*    AMPICILLIN <=2 SENSITIVE Sensitive     VANCOMYCIN 1 SENSITIVE Sensitive     GENTAMICIN SYNERGY SENSITIVE Sensitive     * ENTEROCOCCUS SPECIES  Blood Culture ID Panel (Reflexed)     Status: Abnormal   Collection Time: 12/06/15  2:37 PM  Result Value Ref Range Status   Enterococcus species DETECTED (A) NOT DETECTED Final    Comment: CRITICAL RESULT CALLED TO, READ BACK BY AND VERIFIED WITH: L POINDEXTER PHARMD 0527 12/07/15 A BROWNING    Vancomycin resistance NOT DETECTED NOT DETECTED Final   Listeria monocytogenes NOT DETECTED NOT DETECTED Final   Staphylococcus species NOT DETECTED NOT DETECTED Final   Staphylococcus aureus NOT DETECTED NOT DETECTED Final   Methicillin resistance NOT DETECTED NOT DETECTED Final   Streptococcus species NOT DETECTED NOT DETECTED Final   Streptococcus agalactiae NOT DETECTED NOT DETECTED Final   Streptococcus pneumoniae NOT DETECTED NOT DETECTED Final    Streptococcus pyogenes NOT DETECTED NOT DETECTED Final   Acinetobacter baumannii NOT DETECTED NOT DETECTED Final   Enterobacteriaceae species NOT DETECTED NOT DETECTED Final   Enterobacter cloacae complex NOT DETECTED NOT DETECTED Final   Escherichia coli NOT DETECTED NOT DETECTED Final   Klebsiella oxytoca NOT DETECTED NOT DETECTED Final   Klebsiella pneumoniae NOT DETECTED NOT DETECTED Final   Proteus species NOT DETECTED NOT DETECTED Final   Serratia marcescens NOT DETECTED NOT DETECTED Final   Carbapenem resistance NOT DETECTED NOT DETECTED Final   Haemophilus influenzae NOT DETECTED NOT DETECTED Final   Neisseria meningitidis NOT DETECTED NOT DETECTED Final   Pseudomonas aeruginosa NOT DETECTED NOT DETECTED Final   Candida albicans NOT DETECTED NOT DETECTED Final   Candida glabrata NOT DETECTED NOT DETECTED Final   Candida krusei NOT DETECTED NOT DETECTED Final   Candida parapsilosis NOT DETECTED NOT DETECTED Final   Candida tropicalis NOT DETECTED NOT DETECTED Final    Comment: Performed at [facility]  MRSA PCR Screening     Status: None   Collection Time: 12/06/15  5:30 PM  Result Value Ref Range Status   MRSA by PCR NEGATIVE NEGATIVE Final    Comment:        The GeneXpert MRSA Assay (FDA approved for NASAL specimens only), is one component of a comprehensive MRSA colonization surveillance program. It is not intended to diagnose MRSA infection nor to guide or monitor treatment for MRSA infections.   Culture, blood (Routine X 2) w Reflex to ID Panel     Status: None (Preliminary result)   Collection Time: 12/07/15  9:47 AM  Result Value Ref Range Status   Specimen Description BLOOD LEFT HAND  Final   Special Requests IN PEDIATRIC BOTTLE 1CC  Final   Culture   Final    NO GROWTH 2 DAYS Performed at  [facility]    Report Status PENDING  Incomplete  Culture, blood (Routine X 2) w Reflex to ID Panel     Status: None (Preliminary result)   Collection  Time: 12/07/15 10:35 AM  Result Value Ref Range Status   Specimen Description BLOOD LEFT HAND  Final   Special Requests Immunocompromised  Final   Culture   Final    NO GROWTH 2 DAYS Performed at [facility]    Report Status PENDING  Incomplete    Radiology: Mr [name] Wo Contrast  Result Date: 12/08/2015 CLINICAL DATA:  History of metastatic melanoma with multiple brain lesions. Treated with steroids. Preadmission with possible sepsis 2 days ago. EXAM: MRI HEAD WITHOUT AND WITH CONTRAST TECHNIQUE: Multiplanar, multiecho pulse sequences of the brain and surrounding structures were obtained without and with intravenous contrast. CONTRAST:  2mL MULTIHANCE GADOBENATE DIMEGLUMINE 529 MG/ML IV SOLN COMPARISON:  11/26/2011 FINDINGS: As demonstrated previously, there are multiple infra and supratentorial brain masses with associated hemorrhage consistent with metastatic melanoma with associated hemorrhage. There has been evolutionary maturation of the associated blood products, but there are no newly seen lesions, larger lesions or new hemorrhage. Mass effect is most prominent in the right frontal lobe with the largest lesion measures 5 cm in diameter and is associated with vasogenic edema. Second largest lesion in the left temporal lobe is also similar, with associated vasogenic edema. Ventricular size is stable. No residual layering blood in the ventricles. No extra-axial collection. No evidence of new ischemic infarction. Major vessels at the base of the brain show flow. IMPRESSION: Multiple hemorrhagic metastatic melanoma lesions, approximately 25 in number. No change in the size, number or mass effect of the lesions. Hemorrhagic components are undergoing expected maturational changes in signal characteristics. Mass effect and edema associated with the 2 largest lesions, in the right frontal brain and in the left temporal brain appear quite similar. Electronically Signed   By: Nelson Chimes M.D.    On: 12/08/2015 18:30    Medications:   . ampicillin (OMNIPEN) IV  2 g Intravenous Q4H  . antiseptic oral rinse  7 mL Mouth Rinse BID  . atorvastatin  80 mg Oral QPM  . calcium carbonate (dosed in mg elemental calcium)  500 mg of elemental calcium Oral TID  . dexamethasone  4 mg Oral QID  . feeding supplement (ENSURE ENLIVE)  237 mL Oral TID BM  . gentamicin  120 mg Intravenous Q12H  . levETIRAcetam  750 mg Intravenous Q12H  . pantoprazole  40 mg Oral Daily  . sodium chloride flush  3 mL Intravenous Q12H  . tamsulosin  0.4 mg Oral Daily   Continuous Infusions:    Time spent: 25 minutes.    LOS: 4 days   [facility] Pager [phone]. If unable to reach me by pager, please call my cell phone at [phone].  *Please refer to [email or web address], password [email or web address] to get updated schedule on who will round on this patient, as hospitalists switch teams weekly. If 7PM-7AM, please contact night-coverage at [email or web address], password [email or web address] for any overnight needs.  12/10/2015, 8:54 AM

## 2015-12-10 NOTE — CV Procedure (Signed)
    Transesophageal Echocardiogram Note  Chad Ball IP:[id number] 15-Jun-1945  Procedure: Transesophageal Echocardiogram Indications: bacteremia  Procedure Details Consent: Obtained Time Out: Verified patient identification, verified procedure, site/side was marked, verified correct patient position, special equipment/implants available, Radiology Safety Procedures followed,  medications/allergies/relevent history reviewed, required imaging and test results available.  Performed  Medications:  During this procedure the patient is administered a total of Versed 4 mg and Fentanyl 37.5 mcg  to achieve and maintain moderate conscious sedation.  The patient's heart rate, blood pressure, and oxygen saturation are monitored continuously during the procedure. The period of conscious sedation is 30 minutes, of which I was present face-to-face 100% of this time.  Left Ventrical:  Normal LV function  Mitral Valve: normal , no vegetation.   Mild MR  Aortic Valve: mild - moderate AI,  No vegetation  Tricuspid Valve: no vegetation,   Trace TR   Pulmonic Valve: normal , no vegetation  Left Atrium/ Left atrial appendage: no thrombi   Atrial septum: no PFO or ASD by color doppler   Aorta:  Dilated aortic root and ascending aorta    Complications: No apparent complications Patient did tolerate procedure well.   Thayer Headings, [name], MD, [facility] 12/10/2015, 2:08 PM

## 2015-12-10 NOTE — Progress Notes (Signed)
PULMONARY / CRITICAL CARE MEDICINE   Name: Chad Ball MRN: [id number] DOB: 16-Feb-1946    ADMISSION DATE:  12/06/2015 CONSULTATION DATE: 12/08/2015  REFERRING MD:  Dr. Rockne Menghini, [facility]  CHIEF COMPLAINT:  Bradycardia  HISTORY OF PRESENT ILLNESS:   70 yo male with history of melanoma with metastatic brain lesions.  He as in hospital from 11/24/15 to 12/05/15.  He was started on decadron, and was being evaluated by oncology and radiation oncology.  He was d/c'd to SNF.  On 12/06/15 he was brought back to ER with concern for sepsis.  He had low grade fever, low blood pressure, and altered mental status.  He was started on Abx for UTI.  He was found to have Enterococcal bacteremia, and automatic ID consult was done.  There was concern for septic emboli and endocarditis.  He developed bradycardia.  Cardiology was consulted, and there concern was for increased intracranial pressure causing hypotension and bradycardia.  CT abd/pelvis and MRI liver from recent admission showed evidence concerning for Rt pyelonephritis.  His wife reports that he has problems with his prostate and difficulty passing urine.  He was treated for a bladder infection about 1 month ago, and had foley catheter recently.    She also reports that he has know about "stretched" aorta and leaky heart valve for years.   SUBJECTIVE:  Pt denies SOB, chest pain, headache.  Wife reports concern regarding how much he is sleeping and appears tired.  No acute events overnight.  Pending TEE per Cardiology.   VITAL SIGNS: BP 119/77   Pulse (!) 48   Temp 98.2 F (36.8 C)   Resp 14   Ht 6\' 2"  (1.88 m)   Wt 165 lb 5.5 oz (75 kg)   SpO2 94%   BMI 21.23 kg/m   HEMODYNAMICS:    VENTILATOR SETTINGS:    INTAKE / OUTPUT: I/O last 3 completed shifts: In: 1884.8 [I.V.:936.3; Other:120; IV Piggyback:828.5] Out: 5601 [Urine:5600; Stool:1]  PHYSICAL EXAMINATION: General: chronically ill appearing male in NAD PSY: flat affect but  pleasant  Neuro:  Alert, normal strength, moves all extremities, CN intact HEENT:  Pupils reactive, no stridor, MM pink/moist, no jvd Cardiovascular:  Regular, 2/6 SEM Lungs:  No wheeze/rales. Good ae. CTA.  Abdomen:  Soft, non tender (-) masses.  Musculoskeletal:  No edema Skin:  No rashes or lesions  LABS:  BMET  Recent Labs Lab 12/08/15 0240 12/09/15 0400 12/10/15 0329  NA 135 136 136  K 3.3* 3.5 3.7  CL 111 104 101  CO2 22 25 26   BUN 22* 17 21*  CREATININE 0.56* 0.68 0.86  GLUCOSE 99 120* 125*    Electrolytes  Recent Labs Lab 12/07/15 1147  12/08/15 0240 12/09/15 0400 12/10/15 0329  CALCIUM  --   < > 7.1* 8.8* 8.5*  MG 1.6*  --   --   --   --   PHOS 2.9  --   --   --   --   < > = values in this interval not displayed.  CBC  Recent Labs Lab 12/08/15 0240 12/09/15 0400 12/10/15 0329  WBC 10.8* 9.7 13.0*  HGB 10.8* 12.4* 14.1  HCT 31.2* 35.8* 41.0  PLT 78* 102* 127*    Coag's  Recent Labs Lab 12/06/15 1800  INR 1.17    Sepsis Markers  Recent Labs Lab 12/06/15 1455 12/06/15 1800  LATICACIDVEN 2.24* 1.6    ABG No results for input(s): PHART, PCO2ART, PO2ART in the last 168 hours.  Liver Enzymes  Recent Labs Lab 12/06/15 1436  AST 34  ALT 30  ALKPHOS 95  BILITOT 1.2  ALBUMIN 2.6*    Cardiac Enzymes  Recent Labs Lab 12/08/15 0838 12/08/15 1509 12/08/15 2124  TROPONINI 0.07* 0.07* 0.07*    Glucose  Recent Labs Lab 12/04/15 0823 12/04/15 1221 12/04/15 1757 12/04/15 2153 12/05/15 0750 12/05/15 1142  GLUCAP 144* 173* 142* 170* 129* 150*    Imaging No results found.   STUDIES:  8/01  MRI brain >> numerous masses containing blood products or melanin, surrounding vasogenic edema, possible hemorrhage in ventricular system and mild hydrocephalus 8/02  CT chest >> coronary calcifications, 2.4 cm LUL nodule 8/02  CT abd/pelvis >> 2 low attenuation liver lesions, small hiatal hernia, enlarged prostate, striated  appearance of Rt kidney 8/08  MRI liver >> liver cysts, Rt renal pyelonephritis, 4.5 cm ascending aortic aneurysm 8/12  TTE >> EF 60 to 65%, mod AR, aortic root 44 mm, ascending aorta 48 mm 8/13  MRI brain >> similar as 8/1 MRI. Numerous hemorrhagic masses c/w metastatic melanoma.  8/15  TEE >>   CULTURES: 8/11 Blood >> Enterococcus >> pan sensitive 8/11 Urine >> Klebsiella pneumoniae >> S - rocephin, zosyn, imipenem, R- ampicillin 8/12 Blood >>    ANTIBIOTICS: 8/11 Vancomycin >> 8/11 8/11 Zosyn >> 8/11 8/12 Gentamycin >> 8/12 Ampicillin >>   SIGNIFICANT EVENTS: 8/11  Admit with sepsis, AMS  8/13  For repeat MRI to r/o septic emboli, study c/w metastatic melanoma 8/15  To [facility] for TEE    LINES/TUBES:   DISCUSSION: 70 yo male with recent dx of metastatic melanoma with brain metastases.  He has recently been started on decadron, AEDs, and XRT.  He has hx of prostate enlargement and UTI's.  Imaging studies during recent admission showed concern for Rt pyelonephritis.  He returned to hospital from SNF on 8/11 with sepsis, UTI.  He was found to have bacteremia and bradycardia.  He has prior history of ascending aortic aneurysm and aortic regurgitation.  ID is concerned about endocarditis with septic emboli >> MRI brain from 8/01 was more consistent with metastatic melanoma.  Repeat MRI on 8/13 consistent with numerous hemorrhagic lesions in the setting of metastatic melanoma.  Cardiology is concerned about bradycardia being related to increased intracranial pressure.  This is possible, but improvement in mental status with antibiotics/fluids for sepsis and UTI would seem to speak against significant increased intracranial pressure as cause.    ASSESSMENT / PLAN:  NEUROLOGY A: Acute encephalopathy - in the setting of sepsis, UTI, bacteremia, and metastatic melanoma with hemorrhagic lesions in brain Metastatic brain lesions - suspect lesions related to melanoma and not septic  emboli. P: Monitor neuro status  Supportive care   Continue decadron, keppra Feel CNS lesions likely related to metastatic disease.  TEE pending 8/15  INFECTION A: Sepsis - secondary to enterococcal bacteremia, klebsiella UTI  UTI, Rt pyelonephritis with Klebsiella in urine culture. Enterococcal Bacteremia. P: ABx per ID, appreciate input   CARDIAC A: Hx of ascending aortic aneurysm and aortic regurgitation. Bradycardia. Hx of CAD, HTN, HLD. P: Pending TEE with cardiology 8/15 pm  Monitor hemodynamics Hold ASA given hemorrhagic lesions on MRI  Continue lipitor Hold outpt coreg, cozaar  PULMONARY A: 2.4 cm LUL nodule >> likely metastatic lesion from melanoma. P: Follow up imaging studies as outpt / ONC  RENAL A: Hypokalemia, hypocalcemia, hypomagnesemia. Improved.  Prostate enlargement. P: Trend BMP / UOP  Replace electrolytes as needed Continue flomax  GASTROENTEROLOGY A: Nutrition. Hx of GERD. P: NPO for now 2/2 planned TEE.  Protonix for SUP  HEMATOLOGY A: Anemia, thrombocytopenia in setting of sepsis. P: F/u CBC SCD's for DVT prophylaxis  ENDOCRINOLOGY A: Steroid induced hyperglycemia. P: SSI   GOC:  Patient remains a full code.  Consider early palliative care involvement for goals of care.    PCCM will be available PRN. Please call back if new needs arise.    Noe Gens, NP-C [facility] Pgr: [phone] or if no answer [phone] 12/10/2015, 10:03 AM   ATTENDING NOTE / ATTESTATION NOTE :   I have discussed the case with the resident/APP  Noe Gens.  I agree with the resident/APP's  history, physical examination, assessment, and plans.    I have edited the above note and modified it according to our agreed history, physical examination, assessment and plan.   Unfortunate gentleman with most likely metastatic melanoma to brain and possible lung.  He is undergoing Rx for CA and had Radiation today.  ID is  seeing pt for bacteremia and UTI and sepsis.  There is concern for I.E. Plan for TEE today.  Pt has been haemodynamically stable the last 24 hrs. Awake and oreinted x 3.  PCCM will sign off for now.  Call back if with questions.    Family :Family updated at length today.    Monica Becton, MD 12/10/2015, 11:35 AM [facility] Pager [phone] After 3 pm or if no answer, call [phone]

## 2015-12-10 NOTE — Telephone Encounter (Addendum)
Called ICU 1236 pt  Room, spoke with [facility], we will come get the patient at 10`15 for 1030 am rad tx , last 15 minutes, Per RN, patient to be transported to [facility] for his TEE at 1145am  Via [facility] for 2pm   thanked Rn 9:15 AM

## 2015-12-11 ENCOUNTER — Ambulatory Visit
Admit: 2015-12-11 | Discharge: 2015-12-11 | Disposition: A | Discharge status: Home / Self Care | Payer: Medicare Other | Attending: Radiation Oncology | Admitting: Radiation Oncology

## 2015-12-11 ENCOUNTER — Encounter (HOSPITAL_COMMUNITY): Payer: Self-pay | Admitting: Cardiovascular Disease

## 2015-12-11 DIAGNOSIS — C7931 Secondary malignant neoplasm of brain: Secondary | ICD-10-CM

## 2015-12-11 LAB — CBC WITH DIFFERENTIAL/PLATELET
BASOS PCT: 0 %
Basophils Absolute: 0 10*3/uL (ref 0.0–0.1)
Eosinophils Absolute: 0 10*3/uL (ref 0.0–0.7)
Eosinophils Relative: 0 %
HEMATOCRIT: 38.8 % — AB (ref 39.0–52.0)
Hemoglobin: 13.4 g/dL (ref 13.0–17.0)
Lymphocytes Relative: 9 %
Lymphs Abs: 1.2 10*3/uL (ref 0.7–4.0)
MCH: 30.2 pg (ref 26.0–34.0)
MCHC: 34.5 g/dL (ref 30.0–36.0)
MCV: 87.6 fL (ref 78.0–100.0)
MONO ABS: 0.8 10*3/uL (ref 0.1–1.0)
MONOS PCT: 6 %
NEUTROS ABS: 11.3 10*3/uL — AB (ref 1.7–7.7)
Neutrophils Relative %: 85 %
Platelets: 146 10*3/uL — ABNORMAL LOW (ref 150–400)
RBC: 4.43 MIL/uL (ref 4.22–5.81)
RDW: 13.6 % (ref 11.5–15.5)
WBC: 13.4 10*3/uL — ABNORMAL HIGH (ref 4.0–10.5)

## 2015-12-11 LAB — RENAL FUNCTION PANEL
ALBUMIN: 2.6 g/dL — AB (ref 3.5–5.0)
ANION GAP: 7 (ref 5–15)
BUN: 21 mg/dL — ABNORMAL HIGH (ref 6–20)
CALCIUM: 8 mg/dL — AB (ref 8.9–10.3)
CO2: 26 mmol/L (ref 22–32)
CREATININE: 0.84 mg/dL (ref 0.61–1.24)
Chloride: 100 mmol/L — ABNORMAL LOW (ref 101–111)
GLUCOSE: 118 mg/dL — AB (ref 65–99)
Phosphorus: 3.7 mg/dL (ref 2.5–4.6)
Potassium: 3.9 mmol/L (ref 3.5–5.1)
SODIUM: 133 mmol/L — AB (ref 135–145)

## 2015-12-11 NOTE — Progress Notes (Signed)
[facility] Progress Note  Patient: Chad Ball [id number]   PCP: [name], MD DOB: Jun 29, 1945   DOA: 12/06/2015   DOS: 12/11/2015   Date of Service: the patient was seen and examined on 12/11/2015  Subjective: Patient denies any acute complaint. No nausea no vomiting. No dizziness or lightheadedness. No chest pain. Nutrition: Tolerating oral diet  Brief hospital course: Chad Ball is an 70 y.o. 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 Workup in the ED was consistent with sepsis from a UTI. Currently further plan is continue IV antibiotics and monitor on telemetry.  Assessment and Plan: 1. Enterococcal sepsis due to complicated urinary tract infection ([place]) complicated by acute encephalopathy, rule out endocarditis Patient was admitted as inpatient due to intensity of service and high risk for decompensation. He has a chronic indwelling Foley with evidence of urinary retention (bladder scan performed with 100 mL of residual urine noted). Continue Flomax.Blood pressure has improved, lactate has cleared and his WBC count is improving. Broad-spectrum antibiotics with vancomycin/Zosyn were initiated in the ED. His antibiotics were changed to ampicillin and gentamicin by ID. Since he is chronically on Decadron for his brain metastasis, stress dose steroids were initiated as well, But now back on oral Decadron which has better bioavailability in the brain. Blood cultures are positive for enterococcus. ID recommends TEE to rule out endocarditis. Which was negative for endocarditis and gentamicin discontinued. Continue ampicillin.   Klebsiella UTI Ampicillin resistant, but sensitive to gentamycin.  Discussed with Dr. Tommy Medal who favors not broadening ampicillin to Unasyn.    Active Problems:   Normocytic anemia Hemoglobin currently stable.    Hypokalemia  We'll replete.    Hypocalcemia/hypoalbuminemia  Continue calcium carbonate supplements.    Sinus bradycardia and demand ischemia TSH low, so hypothyroidism not likely the cause of his bradycardia.  Continue to replace calcium as low calcium can be associated with heart rhythm abnormalities. Atropine ordered as needed.12-lead EKG showed anterior T-wave inversions. Troponins elevated with downward trend. Cardiology following with thought that patient's bradycardia might be related to increased intracranial hypertension.    Hyperglycemia/prediabetes Likely steroid-induced. Hemoglobin A1c is 6%, consistent with prediabetes.    Essential hypertension Blood pressure stable, now off IV fluids.    Hyperlipidemia/CAD in native artery/abnormal EKG Continue aspirin and Lipitor. Troponins elevated but trend is down. Cardiology following. ST & T wave abnormalities on EKG worrisome for anterolateral ischemia. 2-D echo did not show any evidence of regional wall motion abnormalities.    Metastatic melanoma (HCC)/brain metastasis/suspected increased intracranial pressure Continue Decadron and Keppra. Have discussed with neurologist, Dr. Tasia Catchings, (thinks brain lesions are more consistent with metastatic melanoma rather than septic emboli) and Dr. Lisbeth Renshaw.   earlier provider have spoke with Dr. Kathyrn Sheriff who feels that the patient's brain masses are MOST consistent with metastasis given the the presence of blood products near the metastasis, which is a fairly common feature of melanoma involving the brain. At this point, would continue Decadron and proceed with radiation therapy as planned. Consider palliative care consultation given prognosis (family is still coping with new  diagnosis is not yet ready for palliative discussion, prefers to discuss with oncologist). Spoke with Dr.  Moody who will see the patient in the hospital. Patient started on palliative radiation.  Pain management: When necessary Tylenol Activity: Consulted physical therapy Bowel regimen: last BM 12/10/2015 Diet: Cardiac diet DVT Prophylaxis: mechanical compression device.  Advance goals of care discussion: Full code  Family Communication: family was present at bedside, at the time of interview. The pt provided permission to discuss medical plan with the family. Opportunity was given to ask question and all questions were answered satisfactorily.   Disposition:  Discharge to home with home health versus SNF. Expected discharge date: 12/13/2015,   Consultants: Cardiology, critical care, radiation oncology Procedures: Radiation, echogram, TEE  Antibiotics: Anti-infectives    Start     Dose/Rate Route Frequency Ordered Stop   12/08/15 1800  gentamicin (GARAMYCIN) 120 mg in dextrose 5 % 50 mL IVPB  Status:  Discontinued     120 mg 106 mL/hr over 30 Minutes Intravenous Every 12 hours 12/08/15 0831 12/11/15 1047   12/07/15 1800  Ampicillin-Sulbactam (UNASYN) 3 g in sodium chloride 0.9 % 100 mL IVPB  Status:  Discontinued     3 g 100 mL/hr over 60 Minutes Intravenous Every 6 hours 12/07/15 1548 12/07/15 1553   12/07/15 1700  gentamicin (GARAMYCIN) 560 mg in dextrose 5 % 100 mL IVPB  Status:  Discontinued     7 mg/kg  80.3 kg 114 mL/hr over 60 Minutes Intravenous Every 24 hours 12/07/15 1558 12/08/15 0831   12/07/15 1630  ampicillin (OMNIPEN) 2 g in sodium chloride 0.9 % 50 mL IVPB     2 g 150 mL/hr over 20 Minutes Intravenous Every 4 hours 12/07/15 1558     12/07/15 1600  ampicillin (OMNIPEN) 2 g in sodium chloride 0.9 % 50 mL IVPB  Status:  Discontinued     2 g 150 mL/hr over 20 Minutes Intravenous Every 4 hours 12/07/15 1543 12/07/15 1547   12/07/15 0930  ampicillin (OMNIPEN) 2 g in  sodium chloride 0.9 % 50 mL IVPB  Status:  Discontinued     2 g 150 mL/hr over 20 Minutes Intravenous Every 4 hours 12/07/15 0906 12/07/15 1525   12/06/15 2200  vancomycin (VANCOCIN) IVPB 750 mg/150 ml premix  Status:  Discontinued     750 mg 150 mL/hr over 60 Minutes Intravenous Every 8 hours 12/06/15 1535 12/07/15 0906   12/06/15 2200  piperacillin-tazobactam (ZOSYN) IVPB 3.375 g  Status:  Discontinued     3.375 g 12.5 mL/hr over 240 Minutes Intravenous Every 8 hours 12/06/15 1535 12/07/15 0906   12/06/15 1415  piperacillin-tazobactam (ZOSYN) IVPB 3.375 g     3.375 g 100 mL/hr over 30 Minutes Intravenous  Once 12/06/15 1413 12/06/15 1551   12/06/15 1415  vancomycin (VANCOCIN) IVPB 1000 mg/200 mL premix     1,000 mg 200 mL/hr over 60 Minutes Intravenous  Once 12/06/15 1413 12/06/15 1619        Intake/Output Summary (Last 24 hours) at 12/11/15 1908 Last data filed at 12/11/15 1900  Gross per 24 hour  Intake            845.5 ml  Output             1700 ml  Net           -854.5 ml   Filed Weights   12/09/15 0444 12/10/15 0329 12/11/15 0300  Weight: 79.1 kg (174 lb 6.1 oz) 75 kg (165 lb 5.5 oz) 73.7 kg (162 lb 7.7 oz)    Objective: Physical Exam: Vitals:  12/11/15 1200 12/11/15 1300 12/11/15 1400 12/11/15 1500  BP: 123/85 114/76 112/84 120/76  Pulse: 84 93 90 81  Resp: 16 19 16 16   Temp: 98.4 F (36.9 C)   98.4 F (36.9 C)  TempSrc: Oral   Oral  SpO2: 95% (!) 86% 91% 92%  Weight:      Height:        General: Alert, Awake and Oriented to Time, Place and Person. Appear in mild distress Eyes: PERRL, Conjunctiva normal ENT: Oral Mucosa clear moist. Neck: no JVD, no Abnormal Mass Or lumps Cardiovascular: S1 and S2 Present, aortic systolic Murmur, Respiratory: Bilateral Air entry equal and Decreased, Clear to Auscultation, no Crackles, no wheezes Abdomen: Bowel Sound present, Soft and no tenderness Skin: no redness, n Rash  Extremities: ono Pedal edema, no calf  tenderness Neurologic: Grossly no focal neuro deficit. Bilaterally Equal motor strength  Data Reviewed: CBC:  Recent Labs Lab 12/06/15 1436 12/07/15 0413 12/08/15 0240 12/09/15 0400 12/10/15 0329 12/11/15 0658  WBC 20.3* 13.9* 10.8* 9.7 13.0* 13.4*  NEUTROABS 17.5*  --   --   --   --  11.3*  HGB 15.7 12.3* 10.8* 12.4* 14.1 13.4  HCT 44.6 36.0* 31.2* 35.8* 41.0 38.8*  MCV 88.0 89.6 88.1 87.3 87.2 87.6  PLT 115* 83* 78* 102* 127* 123456*   Basic Metabolic Panel:  Recent Labs Lab 12/07/15 1147 12/07/15 2317 12/08/15 0240 12/09/15 0400 12/10/15 0329 12/11/15 0658  NA  --  137 135 136 136 133*  K  --  3.4* 3.3* 3.5 3.7 3.9  CL  --  111 111 104 101 100*  CO2  --  22 22 25 26 26   GLUCOSE  --  118* 99 120* 125* 118*  BUN  --  21* 22* 17 21* 21*  CREATININE  --  0.66 0.56* 0.68 0.86 0.84  CALCIUM  --  7.1* 7.1* 8.8* 8.5* 8.0*  MG 1.6*  --   --   --   --   --   PHOS 2.9  --   --   --   --  3.7    Liver Function Tests:  Recent Labs Lab 12/06/15 1436 12/11/15 0658  AST 34  --   ALT 30  --   ALKPHOS 95  --   BILITOT 1.2  --   PROT 6.1*  --   ALBUMIN 2.6* 2.6*   No results for input(s): LIPASE, AMYLASE in the last 168 hours. No results for input(s): AMMONIA in the last 168 hours. Coagulation Profile:  Recent Labs Lab 12/06/15 1800  INR 1.17   Cardiac Enzymes:  Recent Labs Lab 12/07/15 1828 12/07/15 2317 12/08/15 0838 12/08/15 1509 12/08/15 2124  TROPONINI 0.12* 0.09* 0.07* 0.07* 0.07*   BNP (last 3 results) No results for input(s): PROBNP in the last 8760 hours.  CBG:  Recent Labs Lab 12/04/15 2153 12/05/15 0750 12/05/15 1142  GLUCAP 170* 129* 150*    Studies: No results found.   Scheduled Meds: . ampicillin (OMNIPEN) IV  2 g Intravenous Q4H  . antiseptic oral rinse  7 mL Mouth Rinse BID  . atorvastatin  80 mg Oral QPM  . calcium carbonate (dosed in mg elemental calcium)  500 mg of elemental calcium Oral TID  . dexamethasone  4 mg Oral  QID  . feeding supplement (ENSURE ENLIVE)  237 mL Oral TID BM  . levETIRAcetam  750 mg Intravenous Q12H  . pantoprazole  40 mg Oral Daily  . sodium chloride flush  3 mL Intravenous Q12H  . tamsulosin  0.4 mg Oral Daily   Continuous Infusions:  PRN Meds: atropine, HYDROcodone-acetaminophen, lip balm, nitroGLYCERIN, ondansetron **OR** ondansetron (ZOFRAN) IV, sodium chloride  Time spent: 30 minutes  Author: Berle Mull, MD [facility] Pager: [phone] 12/11/2015 7:08 PM  If 7PM-7AM, please contact night-coverage at [email or web address], password [facility]

## 2015-12-11 NOTE — Progress Notes (Signed)
Department of Radiation Oncology  Phone:  [phone] Fax:        [phone]   INPATIENT    Weekly Treatment Note    Name: Chad Ball Date: 12/11/2015 MRN: [id number] DOB: 1945-12-10   Diagnosis:     ICD-9-CM ICD-10-CM   1. Brain metastasis (HCC) 198.3 C79.31      Current dose: 18 Gy  Current fraction: 6   MEDICATIONS: No current facility-administered medications for this encounter.    No current outpatient prescriptions on file.   Facility-Administered Medications Ordered in Other Encounters  Medication Dose Route Frequency Provider Last Rate Last Dose  . ampicillin (OMNIPEN) 2 g in sodium chloride 0.9 % 50 mL IVPB  2 g Intravenous Q4H Venetia Maxon Rama, MD   2 g at 12/11/15 1144  . antiseptic oral rinse (CPC / CETYLPYRIDINIUM CHLORIDE 0.05%) solution 7 mL  7 mL Mouth Rinse BID Venetia Maxon Rama, MD   7 mL at 12/11/15 0956  . atorvastatin (LIPITOR) tablet 80 mg  80 mg Oral QPM Thurnell Lose, MD   80 mg at 12/10/15 1708  . atropine injection 1 mg  1 mg Intravenous Q2H PRN Venetia Maxon Rama, MD   1 mg at 12/08/15 1630  . calcium carbonate (dosed in mg elemental calcium) suspension 500 mg of elemental calcium  500 mg of elemental calcium Oral TID Venetia Maxon Rama, MD   500 mg of elemental calcium at 12/11/15 0955  . dexamethasone (DECADRON) tablet 4 mg  4 mg Oral QID Venetia Maxon Rama, MD   4 mg at 12/11/15 0955  . feeding supplement (ENSURE ENLIVE) (ENSURE ENLIVE) liquid 237 mL  237 mL Oral TID BM Thurnell Lose, MD   237 mL at 12/11/15 0955  . HYDROcodone-acetaminophen (NORCO/VICODIN) 5-325 MG per tablet 1 tablet  1 tablet Oral Q4H PRN Thurnell Lose, MD      . levETIRAcetam (KEPPRA) 750 mg in sodium chloride 0.9 % 100 mL IVPB  750 mg Intravenous Q12H Ritta Slot, NP   750 mg at 12/11/15 0956  . lip balm (CARMEX) ointment   Topical PRN Venetia Maxon Rama, MD      . nitroGLYCERIN (NITROSTAT) SL tablet 0.4 mg  0.4 mg Sublingual Q5 min PRN Thurnell Lose, MD       . ondansetron (ZOFRAN) tablet 4 mg  4 mg Oral Q6H PRN Thurnell Lose, MD       Or  . ondansetron (ZOFRAN) injection 4 mg  4 mg Intravenous Q6H PRN Thurnell Lose, MD      . pantoprazole (PROTONIX) EC tablet 40 mg  40 mg Oral Daily Thurnell Lose, MD   40 mg at 12/11/15 0955  . sodium chloride 0.9 % bolus 1,000 mL  1,000 mL Intravenous PRN Thurnell Lose, MD   1,000 mL at 12/07/15 0905  . sodium chloride flush (NS) 0.9 % injection 3 mL  3 mL Intravenous Q12H Thurnell Lose, MD   3 mL at 12/11/15 0956  . tamsulosin (FLOMAX) capsule 0.4 mg  0.4 mg Oral Daily Thurnell Lose, MD   0.4 mg at 12/11/15 0955     ALLERGIES: Food   LABORATORY DATA:  Lab Results  Component Value Date   WBC 13.4 (H) 12/11/2015   HGB 13.4 12/11/2015   HCT 38.8 (L) 12/11/2015   MCV 87.6 12/11/2015   PLT 146 (L) 12/11/2015   Lab Results  Component Value Date   NA 133 (L) 12/11/2015  K 3.9 12/11/2015   CL 100 (L) 12/11/2015   CO2 26 12/11/2015   Lab Results  Component Value Date   ALT 30 12/06/2015   AST 34 12/06/2015   ALKPHOS 95 12/06/2015   BILITOT 1.2 12/06/2015     NARRATIVE: Chad Ball was seen today for weekly treatment management. The chart was checked and the patient's films were reviewed.  The patient will probably at this time. He remains an inpatient and was able to resume treatment yesterday. No new complaints.  PHYSICAL EXAMINATION: vitals were not taken for this visit.   Alert, no acute distress,  ASSESSMENT: The patient is doing satisfactorily with treatment.  PLAN: We will continue with the patient's radiation treatment as planned. We will discuss tapering his steroids as he gets closer to finishing treatment.

## 2015-12-11 NOTE — Progress Notes (Signed)
Not sent to nursing for assessment, MD saw patient back on Lianc #! 1:50 PM

## 2015-12-11 NOTE — Progress Notes (Signed)
Pharmacy Antibiotic Note  Chad Ball is a 70 y.o. male admitted on 12/06/2015 with  with sepsis. PMH CAD w/ MI s/p stent, arthritis, GERD, and metastatic melanoma; recently admitted 7/30-8/10 for confusion and headaches during which brain mets were identified. 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  TEE from 8/15 negative for vegetations.  ID stopping gent; plan is to treat with ampicillin only for 2 wks total.  Per ID, urine is likely colonized with Amp-resistant Klebsiella, but ampicillin should cover most other potential uropathogens.  Plan:  Continue ampicillin 2gm IV q4h; consider transitioning to PO ampicillin as appropriate.  Pharmacy will sign off as renal function stable and dose adjustments unlikely.  Please reconsult if a change in clinical status warrants our attention.   Height: 6\' 2"  (188 cm) Weight: 162 lb 7.7 oz (73.7 kg) IBW/kg (Calculated) : 82.2  Temp (24hrs), Avg:97.6 F (36.4 C), Min:97.4 F (36.3 C), Max:98.1 F (36.7 C)   Recent Labs Lab 12/06/15 1455 12/06/15 1800 12/07/15 0413 12/07/15 2317 12/08/15 0240 12/09/15 0400 12/10/15 0329 12/11/15 0658  WBC  --   --  13.9*  --  10.8* 9.7 13.0* 13.4*  CREATININE  --   --  0.71 0.66 0.56* 0.68 0.86 0.84  LATICACIDVEN 2.24* 1.6  --   --   --   --   --   --   GENTRANDOM  --   --   --   --  4.4  --   --   --     Estimated Creatinine Clearance: 86.5 mL/min (by C-G formula based on SCr of 0.84 mg/dL).    Allergies  Allergen Reactions  . Food Anaphylaxis and Other (See Comments)    Pt is allergic to [place] nuts.     Antimicrobials this admission: Vancomycin 8/11 >> 8/12 Zosyn 8/11 >> 8/12 Ampicillin 8/12 >>  gentamicin 8/12 >> 8/16   Dose adjustments this admission: 8/13 gent 0240 level = 4.4 (10h level) : Contin 7  mg/kg q24h 8/13 change gent to 120mg  IV q12h (synergy dosing for gram +)   Microbiology results: 8/11 BCx: enterococcus spp 2/2 S-Amp 8/11 UCx: K. Pneumoniae (R to amp, I NTF) 8/11 MRSA PCR: neg 8/12 BCx: ngtd   Thank you for allowing pharmacy to be a part of this patient's care.  Reuel Boom, PharmD, BCPS Pager: [phone] 12/11/2015, 12:28 PM

## 2015-12-11 NOTE — Progress Notes (Signed)
Department of Radiation Oncology  Phone:  [phone] Fax:        [phone]  Weekly Treatment Note    Name: Chad Ball Date: 12/11/2015 MRN: [id number] DOB: 1945/10/18   Diagnosis:  No diagnosis found.   Current dose: 12 Gy  Current fraction: 4   MEDICATIONS: No current facility-administered medications for this visit.    No current outpatient prescriptions on file.   Facility-Administered Medications Ordered in Other Visits  Medication Dose Route Frequency Provider Last Rate Last Dose  . ampicillin (OMNIPEN) 2 g in sodium chloride 0.9 % 50 mL IVPB  2 g Intravenous Q4H Venetia Maxon Rama, MD   2 g at 12/11/15 1144  . antiseptic oral rinse (CPC / CETYLPYRIDINIUM CHLORIDE 0.05%) solution 7 mL  7 mL Mouth Rinse BID Venetia Maxon Rama, MD   7 mL at 12/11/15 0956  . atorvastatin (LIPITOR) tablet 80 mg  80 mg Oral QPM Thurnell Lose, MD   80 mg at 12/10/15 1708  . atropine injection 1 mg  1 mg Intravenous Q2H PRN Venetia Maxon Rama, MD   1 mg at 12/08/15 1630  . calcium carbonate (dosed in mg elemental calcium) suspension 500 mg of elemental calcium  500 mg of elemental calcium Oral TID Venetia Maxon Rama, MD   500 mg of elemental calcium at 12/11/15 0955  . dexamethasone (DECADRON) tablet 4 mg  4 mg Oral QID Venetia Maxon Rama, MD   4 mg at 12/11/15 0955  . feeding supplement (ENSURE ENLIVE) (ENSURE ENLIVE) liquid 237 mL  237 mL Oral TID BM Thurnell Lose, MD   237 mL at 12/11/15 0955  . HYDROcodone-acetaminophen (NORCO/VICODIN) 5-325 MG per tablet 1 tablet  1 tablet Oral Q4H PRN Thurnell Lose, MD      . levETIRAcetam (KEPPRA) 750 mg in sodium chloride 0.9 % 100 mL IVPB  750 mg Intravenous Q12H Ritta Slot, NP   750 mg at 12/11/15 0956  . lip balm (CARMEX) ointment   Topical PRN Venetia Maxon Rama, MD      . nitroGLYCERIN (NITROSTAT) SL tablet 0.4 mg  0.4 mg Sublingual Q5 min PRN Thurnell Lose, MD      . ondansetron (ZOFRAN) tablet 4 mg  4 mg Oral Q6H PRN Thurnell Lose, MD       Or  . ondansetron (ZOFRAN) injection 4 mg  4 mg Intravenous Q6H PRN Thurnell Lose, MD      . pantoprazole (PROTONIX) EC tablet 40 mg  40 mg Oral Daily Thurnell Lose, MD   40 mg at 12/11/15 0955  . sodium chloride 0.9 % bolus 1,000 mL  1,000 mL Intravenous PRN Thurnell Lose, MD   1,000 mL at 12/07/15 0905  . sodium chloride flush (NS) 0.9 % injection 3 mL  3 mL Intravenous Q12H Thurnell Lose, MD   3 mL at 12/11/15 0956  . tamsulosin (FLOMAX) capsule 0.4 mg  0.4 mg Oral Daily Thurnell Lose, MD   0.4 mg at 12/11/15 0955     ALLERGIES: Food   LABORATORY DATA:  Lab Results  Component Value Date   WBC 13.4 (H) 12/11/2015   HGB 13.4 12/11/2015   HCT 38.8 (L) 12/11/2015   MCV 87.6 12/11/2015   PLT 146 (L) 12/11/2015   Lab Results  Component Value Date   NA 133 (L) 12/11/2015   K 3.9 12/11/2015   CL 100 (L) 12/11/2015   CO2 26 12/11/2015  Lab Results  Component Value Date   ALT 30 12/06/2015   AST 34 12/06/2015   ALKPHOS 95 12/06/2015   BILITOT 1.2 12/06/2015     NARRATIVE: Chad Ball was seen today for weekly treatment management. The chart was checked and the patient's films were reviewed.  The patient is an inpatient currently. He states he is feeling better since admission. Doing better on steroids currently. He does have further workup with a TEE tomorrow.  PHYSICAL EXAMINATION: vitals were not taken for this visit.     Alert, no acute distress.  ASSESSMENT: The patient is doing satisfactorily with treatment although she has been admitted for further workup of several issues. He appears able to resume treatment tomorrow.  PLAN: We will continue with the patient's radiation treatment as planned. We will try to have the patient resume tomorrow morning prior to his study in the afternoon.

## 2015-12-11 NOTE — Addendum Note (Signed)
Encounter addended by: Doreen Beam, RN on: 12/11/2015  1:50 PM<BR>    Actions taken: Sign clinical note

## 2015-12-11 NOTE — Progress Notes (Signed)
Hospital Problem List     Principal Problem:   Sepsis due to urinary tract infection ([place]) Active Problems:   Essential hypertension   Hyperlipidemia   CAD in native artery   Brain metastasis (HCC)   Metastatic melanoma (HCC)   Sepsis (HCC)   Hyperglycemia   Acute encephalopathy   Hypocalcemia   Hypoalbuminemia   Nonspecific abnormal electrocardiogram (ECG) (EKG)   Sepsis due to enterococcus (HCC)   Septic shock (HCC)   Acute endocarditis   Septic embolism (HCC)   Normocytic anemia   Hypokalemia   UTI (lower urinary tract infection)   Altered mental status     Patient Profile:   Primary Cardiologist: Dr. Tamala Julian  70 yo male w/ PMH of CAD (s/p PCI), GERD, and melanoma (now with metastatic brain lesions and possible lung metastasis) admitted for sepsis. Cards initially consulted for bradycardia on admission.   Subjective   Sleepy, but arousable. Throat mildly sore yesterday evening following TEE.   Wife concerned about him being discharged too soon.   Inpatient Medications    . ampicillin (OMNIPEN) IV  2 g Intravenous Q4H  . antiseptic oral rinse  7 mL Mouth Rinse BID  . atorvastatin  80 mg Oral QPM  . calcium carbonate (dosed in mg elemental calcium)  500 mg of elemental calcium Oral TID  . dexamethasone  4 mg Oral QID  . feeding supplement (ENSURE ENLIVE)  237 mL Oral TID BM  . gentamicin  120 mg Intravenous Q12H  . levETIRAcetam  750 mg Intravenous Q12H  . pantoprazole  40 mg Oral Daily  . sodium chloride flush  3 mL Intravenous Q12H  . tamsulosin  0.4 mg Oral Daily    Vital Signs    Vitals:   12/11/15 0400 12/11/15 0500 12/11/15 0600 12/11/15 0805  BP: 129/71 104/68 126/69   Pulse: (!) 56 (!) 51 (!) 52   Resp: 16 12 14    Temp:    97.7 F (36.5 C)  TempSrc:    Axillary  SpO2: 95% 96% 95%   Weight:      Height:        Intake/Output Summary (Last 24 hours) at 12/11/15 0819 Last data filed at 12/11/15 0600  Gross per 24 hour  Intake             585.5 ml  Output             2625 ml  Net          -2039.5 ml   Filed Weights   12/09/15 0444 12/10/15 0329 12/11/15 0300  Weight: 174 lb 6.1 oz (79.1 kg) 165 lb 5.5 oz (75 kg) 162 lb 7.7 oz (73.7 kg)    Physical Exam    General: Thin Caucasian male appearing in no acute distress. Head: Normocephalic, atraumatic.  Neck: Supple without bruits, JVD not elevated. Lungs:  Resp regular and unlabored, CTA without wheezing or rales. Heart: RRR, S1, S2, no S3, S4, or murmur; no rub. Abdomen: Soft, non-tender, non-distended with normoactive bowel sounds. No hepatomegaly. No rebound/guarding. No obvious abdominal masses. Extremities: No clubbing, cyanosis, or edema. Distal pedal pulses are 2+ bilaterally. Neuro: Alert and oriented X 3. Moves all extremities spontaneously. Psych: Normal affect.  Labs    CBC  Recent Labs  12/10/15 0329 12/11/15 0658  WBC 13.0* 13.4*  NEUTROABS  --  11.3*  HGB 14.1 13.4  HCT 41.0 38.8*  MCV 87.2 87.6  PLT 127* [id number]*   Basic Metabolic Panel  Recent Labs  12/10/15 0329 12/11/15 0658  NA 136 133*  K 3.7 3.9  CL 101 100*  CO2 26 26  GLUCOSE 125* 118*  BUN 21* 21*  CREATININE 0.86 0.84  CALCIUM 8.5* 8.0*  PHOS  --  3.7   Liver Function Tests  Recent Labs  12/11/15 0658  ALBUMIN 2.6*   No results for input(s): LIPASE, AMYLASE in the last 72 hours. Cardiac Enzymes  Recent Labs  12/08/15 0838 12/08/15 1509 12/08/15 2124  TROPONINI 0.07* 0.07* 0.07*    Telemetry    Sinus bradycardia, HR in low to mid-50's.   ECG    No new tracings.   Cardiac Studies and Radiology    Dg Chest 2 View  Result Date: 12/06/2015 CLINICAL DATA:  Fever; possible sepsis; chemo yesterday for brain ca EXAM: CHEST  2 VIEW COMPARISON:  Chest CT, 11/27/2015 FINDINGS: Left upper lobe mass without significant change from the prior study. Remainder of the lungs is clear. No pleural effusion or pneumothorax. Cardiac silhouette is normal in size. No  mediastinal or hilar masses or evidence of adenopathy. Bony thorax is intact. IMPRESSION: No acute cardiopulmonary disease. Electronically Signed   By: Lajean Manes M.D.   On: 12/06/2015 15:07   Ct Chest W Contrast  Result Date: 11/27/2015 CLINICAL DATA:  Metastatic melanoma. EXAM: CT CHEST, ABDOMEN, AND PELVIS WITH CONTRAST TECHNIQUE: Multidetector CT imaging of the chest, abdomen and pelvis was performed following the standard protocol during bolus administration of intravenous contrast. CONTRAST:  132mL ISOVUE-300 IOPAMIDOL (ISOVUE-300) INJECTION 61% COMPARISON:  None. FINDINGS: CT CHEST FINDINGS Mediastinum/Lymph Nodes: The heart size appears normal. No pericardial effusion. Aortic atherosclerosis noted. LAD coronary artery calcification is identified. The trachea appears patent and is midline. Unremarkable appearance of the esophagus. No enlarged mediastinal, axillary or supraclavicular lymph nodes. Lungs/Pleura: No pleural effusion identified. Pulmonary nodule in the left upper lobe measures 2.4 cm, image 51 of series 4. Scarring noted within both lower lobes. Musculoskeletal: No chest wall mass or suspicious bone lesions identified. CT ABDOMEN PELVIS FINDINGS Hepatobiliary: There are 2 low-attenuation foci within the liver. The largest measure 9 mm. These are too small to reliably characterize. The gallbladder appears normal. No biliary dilatation. Pancreas: No mass, inflammatory changes, or other significant abnormality. Spleen: Within normal limits in size and appearance. Adrenals/Urinary Tract: Normal appearance of the adrenal glands. Striated nephrographic appearance of the right kidney is identified, image number 18 of series 8. The urinary bladder appears normal. Stomach/Bowel: Small hiatal hernia. There is no pathologic dilatation of the small bowel loops. Numerous colonic diverticula are identified. No acute inflammation. Vascular/Lymphatic: Calcified atherosclerotic disease involves the abdominal  aorta. No aneurysm. No enlarged upper abdominal lymph nodes identified. No pelvic or inguinal adenopathy. Reproductive: Prostate gland enlargement noted. Other: There is no ascites or focal fluid collections within the abdomen or pelvis. Musculoskeletal:  No aggressive lytic or sclerotic bone lesions. IMPRESSION: 1. Pulmonary nodule within the left upper lobe is identified and suspicious for metastases. 2. No specific findings identified to suggest metastatic disease to the abdomen or pelvis. 3. Striated nephrographic appearance of the right kidney. This is a nonspecific finding and may be seen with pyelonephritis as well as vascular abnormality such as embolic phenomenon. Clinical correlation suggested. Electronically Signed   By: Kerby Moors M.D.   On: 11/27/2015 17:46   Mr [name] [id number] Contrast  Result Date: 12/08/2015 CLINICAL DATA:  History of metastatic melanoma with multiple brain lesions. Treated with steroids. Preadmission with possible sepsis  2 days ago. EXAM: MRI HEAD WITHOUT AND WITH CONTRAST TECHNIQUE: Multiplanar, multiecho pulse sequences of the brain and surrounding structures were obtained without and with intravenous contrast. CONTRAST:  49mL MULTIHANCE GADOBENATE DIMEGLUMINE 529 MG/ML IV SOLN COMPARISON:  11/26/2011 FINDINGS: As demonstrated previously, there are multiple infra and supratentorial brain masses with associated hemorrhage consistent with metastatic melanoma with associated hemorrhage. There has been evolutionary maturation of the associated blood products, but there are no newly seen lesions, larger lesions or new hemorrhage. Mass effect is most prominent in the right frontal lobe with the largest lesion measures 5 cm in diameter and is associated with vasogenic edema. Second largest lesion in the left temporal lobe is also similar, with associated vasogenic edema. Ventricular size is stable. No residual layering blood in the ventricles. No extra-axial collection. No evidence of  new ischemic infarction. Major vessels at the base of the brain show flow. IMPRESSION: Multiple hemorrhagic metastatic melanoma lesions, approximately 25 in number. No change in the size, number or mass effect of the lesions. Hemorrhagic components are undergoing expected maturational changes in signal characteristics. Mass effect and edema associated with the 2 largest lesions, in the right frontal brain and in the left temporal brain appear quite similar. Electronically Signed   By: Nelson Chimes M.D.   On: 12/08/2015 18:30   Mr [name] [id number] Contrast  Result Date: 11/26/2015 CLINICAL DATA:  70 year old male with speech difficulty and imbalance. Symptoms started 1 week ago complaining of headache. History melanoma removed 1 month ago. Abnormal imaging at [facility] in [place]. Initial encounter. EXAM: MRI HEAD WITHOUT AND WITH CONTRAST TECHNIQUE: Multiplanar, multiecho pulse sequences of the brain and surrounding structures were obtained without and with intravenous contrast. CONTRAST:  83mL MULTIHANCE GADOBENATE DIMEGLUMINE 529 MG/ML IV SOLN COMPARISON:  No comparison exams available. FINDINGS: Exam was performed under general anesthesia. Numerous intracranial masses within the supratentorial and infratentorial region numbering approximately 25 containing blood breakdown products or melanin suggestive of metastatic melanoma given patient's history. Some of the larger lesions include: Right anterior frontal 4.8 x 4.9 x 5 cm mass with marked surrounding vasogenic edema. Cannot exclude extension into the superior right orbital region as bone of the orbital roof may be destroyed. This causes significant mass effect upon the right frontal horn of the lateral ventricular system which is displaced posteriorly and to the right. Left temporal lobe 4.8 x 3.2 x 3.1 cm mass with marked surrounding vasogenic edema and flattening of the left temporal horn. Superior right cerebellar 1.8 cm and 1.4 cm mass with  surrounding vasogenic edema and mild compression the right lateral aspect of the fourth ventricle. Blood or proteinaceous material layering within the deep dependent aspect of the lateral ventricles suggesting breakthrough of hemorrhagic metastatic lesion into the ventricular system. Primary intracranial hemorrhage is a secondary less likely consideration. Abnormal appearance of the sulci on FLAIR imaging may be related to oxygen saturation secondary to the fact this was performed under general anesthesia limiting detection of the possibility of subarachnoid hemorrhage. In addition to significant distortion of the lateral ventricle by metastatic disease, mild hydrocephalus may be present. Major intracranial vascular structures are patent. No acute thrombotic infarct. IMPRESSION: Numerous intracranial masses within the supratentorial and infratentorial region numbering approximately 25 containing blood breakdown products or melanin suggestive of metastatic melanoma given patient's history. Some of the larger lesions include: Right anterior frontal 4.8 x 4.9 x 5 cm mass with marked surrounding vasogenic edema. Cannot exclude extension into the superior right orbital region  as bone of the orbital roof may be destroyed. This causes significant mass effect upon the right frontal horn of the lateral ventricular system which is displaced posteriorly and to the right. Left temporal lobe 4.8 x 3.2 x 3.1 cm mass with marked surrounding vasogenic edema and flattening of the left temporal horn. Superior right cerebellar 1.8 cm and 1.4 cm mass with surrounding vasogenic edema and mild compression the right lateral aspect of the fourth ventricle. Blood or proteinaceous material layering within the deep dependent aspect of the lateral ventricles suggesting breakthrough of hemorrhagic metastatic lesion into the ventricular system. Primary intracranial hemorrhage is a secondary less likely consideration. Abnormal appearance of the  sulci on FLAIR imaging may be related to oxygen saturation secondary to the fact this was performed under general anesthesia limiting detection of the possibility of subarachnoid hemorrhage. In addition to significant distortion of the lateral ventricle by metastatic disease, mild hydrocephalus may be present. These results will be called to the ordering clinician or representative by the Radiologist Assistant, and communication documented in the PACS or zVision Dashboard. Electronically Signed   By: Genia Del M.D.   On: 11/26/2015 15:32   Ct Abdomen Pelvis W Contrast  Result Date: 11/27/2015 CLINICAL DATA:  Metastatic melanoma. EXAM: CT CHEST, ABDOMEN, AND PELVIS WITH CONTRAST TECHNIQUE: Multidetector CT imaging of the chest, abdomen and pelvis was performed following the standard protocol during bolus administration of intravenous contrast. CONTRAST:  133mL ISOVUE-300 IOPAMIDOL (ISOVUE-300) INJECTION 61% COMPARISON:  None. FINDINGS: CT CHEST FINDINGS Mediastinum/Lymph Nodes: The heart size appears normal. No pericardial effusion. Aortic atherosclerosis noted. LAD coronary artery calcification is identified. The trachea appears patent and is midline. Unremarkable appearance of the esophagus. No enlarged mediastinal, axillary or supraclavicular lymph nodes. Lungs/Pleura: No pleural effusion identified. Pulmonary nodule in the left upper lobe measures 2.4 cm, image 51 of series 4. Scarring noted within both lower lobes. Musculoskeletal: No chest wall mass or suspicious bone lesions identified. CT ABDOMEN PELVIS FINDINGS Hepatobiliary: There are 2 low-attenuation foci within the liver. The largest measure 9 mm. These are too small to reliably characterize. The gallbladder appears normal. No biliary dilatation. Pancreas: No mass, inflammatory changes, or other significant abnormality. Spleen: Within normal limits in size and appearance. Adrenals/Urinary Tract: Normal appearance of the adrenal glands. Striated  nephrographic appearance of the right kidney is identified, image number 18 of series 8. The urinary bladder appears normal. Stomach/Bowel: Small hiatal hernia. There is no pathologic dilatation of the small bowel loops. Numerous colonic diverticula are identified. No acute inflammation. Vascular/Lymphatic: Calcified atherosclerotic disease involves the abdominal aorta. No aneurysm. No enlarged upper abdominal lymph nodes identified. No pelvic or inguinal adenopathy. Reproductive: Prostate gland enlargement noted. Other: There is no ascites or focal fluid collections within the abdomen or pelvis. Musculoskeletal:  No aggressive lytic or sclerotic bone lesions. IMPRESSION: 1. Pulmonary nodule within the left upper lobe is identified and suspicious for metastases. 2. No specific findings identified to suggest metastatic disease to the abdomen or pelvis. 3. Striated nephrographic appearance of the right kidney. This is a nonspecific finding and may be seen with pyelonephritis as well as vascular abnormality such as embolic phenomenon. Clinical correlation suggested. Electronically Signed   By: Kerby Moors M.D.   On: 11/27/2015 17:46   Mr Liver W [id number] Contrast  Result Date: 12/03/2015 CLINICAL DATA:  Metastatic melanoma.  Evaluate liver lesion. EXAM: MRI ABDOMEN WITHOUT AND WITH CONTRAST TECHNIQUE: Multiplanar multisequence MR imaging of the abdomen was performed both before and after  the administration of intravenous contrast. CONTRAST:  16 cc MultiHance COMPARISON:  CT 11/27/2015 FINDINGS: Moderate motion degradation throughout. The initial series, including the TT respiratory triggered axials, are of moderate to good quality. The later series, including the pre and postcontrast dynamic, are severely motion degraded and essentially nondiagnostic. Lower chest: Normal heart size without pericardial or pleural effusion. Incidental note is made of ascending aortic dilatation, including at 4.5 cm on image 2/series  1802. Hepatobiliary: The 2 left hepatic lobe lesions are markedly T2 hyperintense on image 16/ series 3 and demonstrate no significant post-contrast enhancement. Most consistent with simple cysts. Larger is in the lateral segment left liver lobe at 8 mm. No suspicious liver lesion. Normal gallbladder, without biliary ductal dilatation. Pancreas: Normal, without mass or ductal dilatation. Spleen: Normal in size, without focal abnormality. Adrenals/Urinary Tract: Normal adrenal glands. Tiny bilateral renal lesions which are likely cysts. Heterogeneous T2 signal within the interpolar right kidney, including wedge-shaped area of T2 hypo intensity on image 28/ series 3. Concurrent hypo enhancement. No hydronephrosis. Stomach/Bowel: Small hiatal hernia. Otherwise normal stomach and abdominal bowel loops. Vascular/Lymphatic: Normal caliber of the aorta and branch vessels. Retroaortic left renal vein. Right renal vein grossly patent. No retroperitoneal or retrocrural adenopathy. Other: No ascites. Musculoskeletal: No acute osseous abnormality. IMPRESSION: 1. Moderate motion degradation. 2. The hepatic lesions are consistent with cysts. No evidence of hepatic metastasis. 3. Right renal abnormality is favored to represent pyelonephritis. Correlate with urinalysis. 4. Ascending aortic aneurysm, on the order of 4.5 cm. Recommend semi-annual imaging followup by CTA or MRA and referral to cardiothoracic surgery if not already obtained. This recommendation follows 2010 ACCF/AHA/AATS/ACR/ASA/SCA/SCAI/SIR/STS/SVM Guidelines for the Diagnosis and Management of Patients With Thoracic Aortic Disease. Circulation. 2010; [id number] Electronically Signed   By: Abigail Miyamoto M.D.   On: 12/03/2015 08:25    Echocardiogram: 12/07/2015 Study Conclusions  - Left ventricle: The cavity size was normal. Wall thickness was   normal. Systolic function was normal. The estimated ejection   fraction was in the range of 60% to 65%. Wall  motion was normal;   there were no regional wall motion abnormalities. Doppler   parameters are consistent with abnormal left ventricular   relaxation (grade 1 diastolic dysfunction). - Aortic valve: There was moderate regurgitation. Valve area (VTI):   2.41 cm^2. Valve area (Vmax): 2.27 cm^2. Valve area (Vmean): 2.18   cm^2. - Aorta: Aortic root dimension: 44 mm (ED). Ascending aortic   diameter: 48 mm (S). - Aortic root: The aortic root was mildly dilated. - Ascending aorta: The ascending aorta was moderately dilated. - Left atrium: The atrium was mildly dilated. - Technically difficult study. If high suspicion of valvular   endocarditis consider TEE.   TEE: 12/10/2015 Study Conclusions  - Left ventricle: The cavity size was normal. Wall thickness was   normal. Systolic function was normal. - Aortic valve: No evidence of vegetation. There was mild to   moderate regurgitation. - Mitral valve: No evidence of vegetation. There was mild   regurgitation. - Left atrium: No evidence of thrombus in the atrial cavity or   appendage. - Tricuspid valve: No evidence of vegetation. - Pulmonic valve: No evidence of vegetation.  Assessment & Plan    1. Sinus Bradycardia - HR in the low-40's on admission. Poor PPM candidate at this time secondary to infection.  - no Atropine required since 12/08/2015. HR improved to low-mid 50's. Continue to avoid AV nodal blocking agents. Does not appear symptomatic, however has not been  active since admission. Symptoms of dizziness/lightheadedness may be hard to distinguish from bradycardia and known brain mets (25 lesions identified on Brain MRI). Would likely benefit from PT consult.  2. Ascending Aortic Aneurysm - echo this admission showing Aortic root dimension: 44 mm (ED). Ascending aortic diameter: 48 mm (S).  3. CAD - denies any recent anginal symptoms. Troponin values flat at 0.07 this admission. - continue statin therapy. ASA stopped  secondary to thrombocytopenia (platelet count 78K on 8/13, improved to 146 today). No BB secondary to bradycardia.    4. Enteroccoccal bacteremia and possible endocarditis  - ID following. TEE on 8/15 showed no evidence of vegetation.   5. Metastatic Melanoma - now with metastatic brain lesions and possible lung mets.    Arna Medici , PA-C 8:19 AM 12/11/2015 Pager: [phone]   Patient seen and examined. Agree with assessment and plan. No chest pain or dyspnea. Just back from radiation treatment. Sinus rhythm now in the 80s without bradycardia. No vegetations seen on cardiac valves. Discussed with wife.  Will sign off and be available as needed.   Troy Sine, MD, [facility] 12/11/2015 1:41 PM

## 2015-12-11 NOTE — Progress Notes (Signed)
Report taken from ICU RN. Pt admitted to room 1429. Agree with Shift Assessment. Pt and wife oriented to room and unit.

## 2015-12-12 ENCOUNTER — Ambulatory Visit
Admit: 2015-12-12 | Discharge: 2015-12-12 | Disposition: A | Discharge status: Home / Self Care | Payer: Medicare Other | Attending: Radiation Oncology | Admitting: Radiation Oncology

## 2015-12-12 DIAGNOSIS — B370 Candidal stomatitis: Secondary | ICD-10-CM

## 2015-12-12 DIAGNOSIS — C7800 Secondary malignant neoplasm of unspecified lung: Secondary | ICD-10-CM

## 2015-12-12 LAB — BASIC METABOLIC PANEL
ANION GAP: 9 (ref 5–15)
BUN: 25 mg/dL — ABNORMAL HIGH (ref 6–20)
CALCIUM: 8.5 mg/dL — AB (ref 8.9–10.3)
CO2: 25 mmol/L (ref 22–32)
Chloride: 100 mmol/L — ABNORMAL LOW (ref 101–111)
Creatinine, Ser: 1.06 mg/dL (ref 0.61–1.24)
Glucose, Bld: 122 mg/dL — ABNORMAL HIGH (ref 65–99)
Potassium: 4.9 mmol/L (ref 3.5–5.1)
Sodium: 134 mmol/L — ABNORMAL LOW (ref 135–145)

## 2015-12-12 LAB — CULTURE, BLOOD (ROUTINE X 2)
CULTURE: NO GROWTH
Culture: NO GROWTH

## 2015-12-12 LAB — MAGNESIUM: Magnesium: 1.9 mg/dL (ref 1.7–2.4)

## 2015-12-12 MED ORDER — SODIUM CHLORIDE 0.9% FLUSH: 10.0000 mL | INTRAVENOUS | Status: DC | PRN

## 2015-12-12 NOTE — Progress Notes (Signed)
[facility] Progress Note  Patient: Chad Ball [id number]   PCP: [name], MD DOB: April 30, 1945   DOA: 12/06/2015   DOS: 12/12/2015   Date of Service: the patient was seen and examined on 12/12/2015  Subjective: Patient is feeling much better. Denies any acute complaint. No dizziness or lightheadedness. Appetite is also improving. Nutrition: Tolerating oral diet  Brief hospital course: Chad Ball is an 70 y.o. 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 Workup in the ED was consistent with sepsis from a UTI. Currently further plan is continue IV antibiotics and monitor on telemetry.  Assessment and Plan: 1. Enterococcal sepsis due to complicated urinary tract infection ([place]) complicated by acute encephalopathy, rule out endocarditis Patient was admitted as inpatient due to intensity of service and high risk for decompensation. He has a chronic indwelling Foley with evidence of urinary retention (bladder scan performed with 100 mL of residual urine noted). Continue Flomax.Blood pressure has improved, lactate has cleared and his WBC count is improving. Broad-spectrum antibiotics with vancomycin/Zosyn were initiated in the ED. His antibiotics were changed to ampicillin and gentamicin by ID. Since he is chronically on Decadron for his brain metastasis, stress dose steroids were initiated as well, But now back on oral Decadron which has better bioavailability in the brain. Blood cultures are positive for enterococcus. ID recommends TEE to rule out endocarditis. Which was negative for endocarditis and gentamicin discontinued. Continue  ampicillin. PICC line inserted.    Klebsiella UTI Ampicillin resistant, but sensitive to gentamycin.  Discussed with Dr. Tommy Medal who favors not broadening ampicillin to Unasyn.    Active Problems:   Normocytic anemia Hemoglobin currently stable.    Hypokalemia  Stable    Hypocalcemia/hypoalbuminemia  Continue calcium carbonate supplements.    Sinus bradycardia and demand ischemia TSH low, so hypothyroidism not likely the cause of his bradycardia.  Continue to replace calcium as low calcium can be associated with heart rhythm abnormalities. Atropine ordered as needed.12-lead EKG showed anterior T-wave inversions. Troponins elevated with downward trend. Cardiology following with thought that patient's bradycardia might be related to increased intracranial hypertension.    Hyperglycemia/prediabetes Likely steroid-induced. Hemoglobin A1c is 6%, consistent with prediabetes.    Essential hypertension Blood pressure stable, now off IV fluids.    Hyperlipidemia/CAD in native artery/abnormal EKG Continue aspirin and Lipitor. Troponins elevated but trend is down. Cardiology following. ST & T wave abnormalities on EKG worrisome for anterolateral ischemia. 2-D echo did not show any evidence of regional wall motion abnormalities.    Metastatic melanoma (HCC)/brain metastasis/suspected increased intracranial pressure Continue Decadron and Keppra. Have discussed with neurologist, Dr. Tasia Catchings, (thinks brain lesions are more consistent with metastatic melanoma rather than septic emboli) and Dr. Lisbeth Renshaw.   earlier provider have spoke with Dr. Kathyrn Sheriff who feels that the patient's brain masses are MOST consistent with metastasis given the the presence of blood products near the metastasis, which is a fairly common feature of melanoma involving the brain. At this point, would continue Decadron and proceed with radiation therapy as planned. Consider palliative care consultation given prognosis  (family is still coping with new diagnosis is not yet ready for palliative discussion, prefers to discuss  with oncologist). Spoke with Dr. Lisbeth Renshaw who will see the patient in the hospital. Patient started on palliative radiation. I spoke with Dr. Lindi Adie and patient has a follow-up appointment to establish care with medical oncology as well.  Pain management: When necessary Tylenol Activity: Consulted physical therapy Bowel regimen: last BM 12/10/2015 Diet: Cardiac diet DVT Prophylaxis: mechanical compression device.  Advance goals of care discussion: Full code  Family Communication: family was present at bedside, at the time of interview. The pt provided permission to discuss medical plan with the family. Opportunity was given to ask question and all questions were answered satisfactorily.   Disposition:  Discharge to home with SNF. Expected discharge date: 12/13/2015,   Consultants: Cardiology, critical care, radiation oncology Procedures: Radiation, echogram, TEE  Antibiotics: Anti-infectives    Start     Dose/Rate Route Frequency Ordered Stop   12/08/15 1800  gentamicin (GARAMYCIN) 120 mg in dextrose 5 % 50 mL IVPB  Status:  Discontinued     120 mg 106 mL/hr over 30 Minutes Intravenous Every 12 hours 12/08/15 0831 12/11/15 1047   12/07/15 1800  Ampicillin-Sulbactam (UNASYN) 3 g in sodium chloride 0.9 % 100 mL IVPB  Status:  Discontinued     3 g 100 mL/hr over 60 Minutes Intravenous Every 6 hours 12/07/15 1548 12/07/15 1553   12/07/15 1700  gentamicin (GARAMYCIN) 560 mg in dextrose 5 % 100 mL IVPB  Status:  Discontinued     7 mg/kg  80.3 kg 114 mL/hr over 60 Minutes Intravenous Every 24 hours 12/07/15 1558 12/08/15 0831   12/07/15 1630  ampicillin (OMNIPEN) 2 g in sodium chloride 0.9 % 50 mL IVPB     2 g 150 mL/hr over 20 Minutes Intravenous Every 4 hours 12/07/15 1558     12/07/15 1600  ampicillin (OMNIPEN) 2 g in sodium chloride 0.9 % 50 mL IVPB  Status:  Discontinued     2  g 150 mL/hr over 20 Minutes Intravenous Every 4 hours 12/07/15 1543 12/07/15 1547   12/07/15 0930  ampicillin (OMNIPEN) 2 g in sodium chloride 0.9 % 50 mL IVPB  Status:  Discontinued     2 g 150 mL/hr over 20 Minutes Intravenous Every 4 hours 12/07/15 0906 12/07/15 1525   12/06/15 2200  vancomycin (VANCOCIN) IVPB 750 mg/150 ml premix  Status:  Discontinued     750 mg 150 mL/hr over 60 Minutes Intravenous Every 8 hours 12/06/15 1535 12/07/15 0906   12/06/15 2200  piperacillin-tazobactam (ZOSYN) IVPB 3.375 g  Status:  Discontinued     3.375 g 12.5 mL/hr over 240 Minutes Intravenous Every 8 hours 12/06/15 1535 12/07/15 0906   12/06/15 1415  piperacillin-tazobactam (ZOSYN) IVPB 3.375 g     3.375 g 100 mL/hr over 30 Minutes Intravenous  Once 12/06/15 1413 12/06/15 1551   12/06/15 1415  vancomycin (VANCOCIN) IVPB 1000 mg/200 mL premix     1,000 mg 200 mL/hr over 60 Minutes Intravenous  Once 12/06/15 1413 12/06/15 1619        Intake/Output Summary (Last 24 hours) at 12/12/15 1929 Last data filed at 12/12/15 1500  Gross per 24 hour  Intake              150 ml  Output              400 ml  Net             -250 ml   Filed Weights   12/10/15 0329 12/11/15 0300 12/12/15 0532  Weight:  75 kg (165 lb 5.5 oz) 73.7 kg (162 lb 7.7 oz) 72.3 kg (159 lb 8 oz)    Objective: Physical Exam: Vitals:   12/11/15 1500 12/11/15 2207 12/12/15 0532 12/12/15 1500  BP: 120/76 104/66 131/82 110/60  Pulse: 81 81 (!) 59 74  Resp: 16 18 18 20   Temp: 98.4 F (36.9 C) 98.3 F (36.8 C) 98.2 F (36.8 C) 98.4 F (36.9 C)  TempSrc: Oral Oral Oral Oral  SpO2: 92% 96% 98% 98%  Weight:   72.3 kg (159 lb 8 oz)   Height:        General: Alert, Awake and Oriented to Time, Place and Person. Appear in mild distress Eyes: PERRL, Conjunctiva normal ENT: Oral Mucosa clear moist. Neck: no JVD, no Abnormal Mass Or lumps Cardiovascular: S1 and S2 Present, aortic systolic Murmur, Respiratory: Bilateral Air entry equal  and Decreased, Clear to Auscultation, no Crackles, no wheezes Abdomen: Bowel Sound present, Soft and no tenderness Skin: no redness, n Rash  Extremities: ono Pedal edema, no calf tenderness Neurologic: Grossly no focal neuro deficit. Bilaterally Equal motor strength  Data Reviewed: CBC:  Recent Labs Lab 12/06/15 1436 12/07/15 0413 12/08/15 0240 12/09/15 0400 12/10/15 0329 12/11/15 0658  WBC 20.3* 13.9* 10.8* 9.7 13.0* 13.4*  NEUTROABS 17.5*  --   --   --   --  11.3*  HGB 15.7 12.3* 10.8* 12.4* 14.1 13.4  HCT 44.6 36.0* 31.2* 35.8* 41.0 38.8*  MCV 88.0 89.6 88.1 87.3 87.2 87.6  PLT 115* 83* 78* 102* 127* [id number]*   Basic Metabolic Panel:  Recent Labs Lab 12/07/15 1147  12/08/15 0240 12/09/15 0400 12/10/15 0329 12/11/15 0658 12/12/15 0528  NA  --   < > 135 136 136 133* 134*  K  --   < > 3.3* 3.5 3.7 3.9 4.9  CL  --   < > 111 104 101 100* 100*  CO2  --   < > 22 25 26 26 25   GLUCOSE  --   < > 99 120* 125* 118* 122*  BUN  --   < > 22* 17 21* 21* 25*  CREATININE  --   < > 0.56* 0.68 0.86 0.84 1.06  CALCIUM  --   < > 7.1* 8.8* 8.5* 8.0* 8.5*  MG 1.6*  --   --   --   --   --  1.9  PHOS 2.9  --   --   --   --  3.7  --   < > = values in this interval not displayed.  Liver Function Tests:  Recent Labs Lab 12/06/15 1436 12/11/15 0658  AST 34  --   ALT 30  --   ALKPHOS 95  --   BILITOT 1.2  --   PROT 6.1*  --   ALBUMIN 2.6* 2.6*   No results for input(s): LIPASE, AMYLASE in the last 168 hours. No results for input(s): AMMONIA in the last 168 hours. Coagulation Profile:  Recent Labs Lab 12/06/15 1800  INR 1.17   Cardiac Enzymes:  Recent Labs Lab 12/07/15 1828 12/07/15 2317 12/08/15 0838 12/08/15 1509 12/08/15 2124  TROPONINI 0.12* 0.09* 0.07* 0.07* 0.07*   BNP (last 3 results) No results for input(s): PROBNP in the last 8760 hours.  CBG: No results for input(s): GLUCAP in the last 168 hours.  Studies: No results found.   Scheduled Meds: .  ampicillin (OMNIPEN) IV  2 g Intravenous Q4H  . antiseptic oral rinse  7 mL Mouth  Rinse BID  . atorvastatin  80 mg Oral QPM  . calcium carbonate (dosed in mg elemental calcium)  500 mg of elemental calcium Oral TID  . dexamethasone  4 mg Oral QID  . feeding supplement (ENSURE ENLIVE)  237 mL Oral TID BM  . levETIRAcetam  750 mg Intravenous Q12H  . pantoprazole  40 mg Oral Daily  . sodium chloride flush  3 mL Intravenous Q12H  . tamsulosin  0.4 mg Oral Daily   Continuous Infusions:  PRN Meds: atropine, HYDROcodone-acetaminophen, lip balm, nitroGLYCERIN, ondansetron **OR** ondansetron (ZOFRAN) IV, sodium chloride, sodium chloride flush  Time spent: 30 minutes  Author: Berle Mull, MD [facility] Pager: [phone] 12/12/2015 7:29 PM  If 7PM-7AM, please contact night-coverage at [email or web address], password [facility]

## 2015-12-12 NOTE — Evaluation (Signed)
Physical Therapy Evaluation Patient Details Name: Chad Ball MRN: [id number] DOB: 29-Apr-1945 Today's Date: 12/12/2015   History of Present Illness  70 y.o. male with a PMH of CAD w/ MI s/p stent, arthritis, GERD, and a recent diagnosis of melanoma on the chest status post resection 1 month ago, subsequently found to have metastatic brain lesions as well as possible lung metastasis with pathologic lymphadenopathy of the left hilum, status post brain radiation, who was admitted from his SNF for sepsis due to UTI  Clinical Impression  Pt admitted with above diagnosis. Pt currently with functional limitations due to the deficits listed below (see PT Problem List).  Pt will benefit from skilled PT to increase their independence and safety with mobility to allow discharge to the venue listed below.   Pt would benefit from returning to rehab in SNF setting prior to d/c home.     Follow Up Recommendations SNF    Equipment Recommendations  Rolling walker with 5" wheels;3in1 (PT);Wheelchair (measurements PT);Wheelchair cushion (measurements PT);Hospital bed    Recommendations for Other Services       Precautions / Restrictions Precautions Precautions: Fall Precaution Comments: generally weak, monitor HR      Mobility  Bed Mobility Overal bed mobility: Needs Assistance Bed Mobility: Supine to Sit;Sit to Supine     Supine to sit: Max assist;+2 for physical assistance Sit to supine: Min assist   General bed mobility comments: required MAX encouragement and pt repeated "I can't do this", "let me sit (although he was sitting, meant lay down)".  assist more likely due to resistance to participate; attempted sitting EOB x2  Transfers                    Ambulation/Gait                Stairs            Wheelchair Mobility    Modified Rankin (Stroke Patients Only)       Balance       Sitting balance - Comments: requiring support at this time likely due to  resistance to be sitting upright                                     Pertinent Vitals/Pain Pain Assessment: No/denies pain    Home Living Family/patient expects to be discharged to:: Skilled nursing facility Living Arrangements: Spouse/significant other;Parent               Additional Comments: recent admission and discharged to SNF    Prior Function Level of Independence: Independent         Comments: Was independent until recent admission. Was driving up to [place] to care for his Mom.  Wife reports he was here part time and there at least 3 days per week     Hand Dominance   Dominant Hand: Right    Extremity/Trunk Assessment   Upper Extremity Assessment: Generalized weakness           Lower Extremity Assessment: Generalized weakness;Difficult to assess due to impaired cognition      Cervical / Trunk Assessment: Normal  Communication   Communication: Other (comment) (difficult to tell, waxes and wanes)  Cognition Arousal/Alertness: Lethargic Behavior During Therapy: Flat affect Overall Cognitive Status: Impaired/Different from baseline Area of Impairment: Orientation;Attention;Memory;Following commands;Safety/judgement;Awareness;Problem solving Orientation Level: Disoriented to;Place;Time;Situation Current Attention Level: Focused Memory: Decreased short-term memory Following  Commands: Follows one step commands inconsistently Safety/Judgement: Decreased awareness of deficits Awareness: Intellectual Problem Solving: Difficulty sequencing;Requires verbal cues;Requires tactile cues;Decreased initiation General Comments: Kept saying, "I can't do this" despite encouragement from spouse. Kept attempting to lie back down once sitting EOB.    General Comments      Exercises        Assessment/Plan    PT Assessment Patient needs continued PT services  PT Diagnosis Difficulty walking;Generalized weakness;Altered mental status   PT Problem  List Decreased strength;Decreased activity tolerance;Decreased balance;Decreased coordination;Decreased mobility;Decreased cognition;Decreased knowledge of use of DME;Decreased safety awareness;Decreased knowledge of precautions  PT Treatment Interventions DME instruction;Gait training;Functional mobility training;Therapeutic exercise;Therapeutic activities;Balance training;Neuromuscular re-education;Cognitive remediation;Patient/family education   PT Goals (Current goals can be found in the Care Plan section) Acute Rehab PT Goals Patient Stated Goal: spouse would like pt to be able to stand and use bathroom (so she can finally bring him home) PT Goal Formulation: With patient Time For Goal Achievement: 12/26/15 Potential to Achieve Goals: Fair    Frequency Min 3X/week   Barriers to discharge        Co-evaluation               End of Session Equipment Utilized During Treatment: Gait belt Activity Tolerance: Patient limited by fatigue Patient left: in bed;with call bell/phone within reach;with bed alarm set;with family/visitor present Nurse Communication: Mobility status         Time: [id number] PT Time Calculation (min) (ACUTE ONLY): 26 min   Charges:   PT Evaluation $PT Eval Moderate Complexity: 1 Procedure     PT G Codes:        [name],[name] 12/12/2015, 1:38 PM [name], PT, DPT 12/12/2015 Pager: [phone]

## 2015-12-12 NOTE — Progress Notes (Signed)
Peripherally Inserted Central Catheter/Midline Placement  The IV Nurse has discussed with the patient and/or persons authorized to consent for the patient, the purpose of this procedure and the potential benefits and risks involved with this procedure.  The benefits include less needle sticks, lab draws from the catheter and patient may be discharged home with the catheter.  Risks include, but not limited to, infection, bleeding, blood clot (thrombus formation), and puncture of an artery; nerve damage and irregular heat beat.  Alternatives to this procedure were also discussed.  Consent obtained from wife due to altered mental status.  PICC/Midline Placement Documentation        Tanazia Achee, Nicolette Bang 12/12/2015, 12:24 PM

## 2015-12-12 NOTE — Progress Notes (Signed)
[facility] for Infectious Disease    Date of Admission:  12/06/2015   Total days of antibiotics 7        Day 6 amp                  ID: Chad Ball is a 70 y.o. male with metastatic melanoma admitted for sepsis due to enterococcal bacteremia Principal Problem:   Sepsis due to urinary tract infection ([place]) Active Problems:   Essential hypertension   Hyperlipidemia   CAD in native artery   Brain metastasis (HCC)   Metastatic melanoma (HCC)   Sepsis ([place])   Hyperglycemia   Acute encephalopathy   Hypocalcemia   Hypoalbuminemia   Nonspecific abnormal electrocardiogram (ECG) (EKG)   Sepsis due to enterococcus (HCC)   Septic shock (HCC)   Acute endocarditis   Septic embolism (HCC)   Normocytic anemia   Hypokalemia   UTI (lower urinary tract infection)   Altered mental status    Subjective: Afebrile, continues to undergo radiation for CNS mets, evaluated by PT/OT who recommend SNF.  picc line placed today. He denies fever ,chills, nightsweats. His wife says he has not taken much to drink today  Medications:  . ampicillin (OMNIPEN) IV  2 g Intravenous Q4H  . antiseptic oral rinse  7 mL Mouth Rinse BID  . atorvastatin  80 mg Oral QPM  . calcium carbonate (dosed in mg elemental calcium)  500 mg of elemental calcium Oral TID  . dexamethasone  4 mg Oral QID  . feeding supplement (ENSURE ENLIVE)  237 mL Oral TID BM  . levETIRAcetam  750 mg Intravenous Q12H  . pantoprazole  40 mg Oral Daily  . sodium chloride flush  3 mL Intravenous Q12H  . tamsulosin  0.4 mg Oral Daily    Objective: Vital signs in last 24 hours: Temp:  [98.2 F (36.8 C)-98.4 F (36.9 C)] 98.4 F (36.9 C) (08/17 1500) Pulse Rate:  [59-81] 74 (08/17 1500) Resp:  [18-20] 20 (08/17 1500) BP: (104-131)/(60-82) 110/60 (08/17 1500) SpO2:  [96 %-98 %] 98 % (08/17 1500) Weight:  [159 lb 8 oz (72.3 kg)] 159 lb 8 oz (72.3 kg) (08/17 0532) Physical Exam  Constitutional: He is oriented to  person,opens eyes readily. He appears frail, chronically ill. No distress.  HENT:   Cardiovascular: brady, regular rhythm and normal heart sounds. Exam reveals no gallop and no friction rub.  No murmur heard.  Pulmonary/Chest: Effort normal and breath sounds normal. No respiratory distress. He has no wheezes.  Abdominal: Soft. Bowel sounds are normal. He exhibits no distension. There is no tenderness.  Lymphadenopathy:  He has no cervical adenopathy.  Neurological: He is alert and oriented to person, place, and time.  Skin: Skin is warm and dry. No rash noted. No erythema.  Psychiatric: He has a normal mood and affect. His behavior is normal.     Lab Results  Recent Labs  12/10/15 0329 12/11/15 0658 12/12/15 0528  WBC 13.0* 13.4*  --   HGB 14.1 13.4  --   HCT 41.0 38.8*  --   NA 136 133* 134*  K 3.7 3.9 4.9  CL 101 100* 100*  CO2 26 26 25   BUN 21* 21* 25*  CREATININE 0.86 0.84 1.06    Microbiology: 8/12 blood cx ngtd 8/11 blood cx enterococcus amp S 8/11 urine cx klebsiella /amp R, amp/sub S, gent s  Studies/Results: tEE negative  Assessment/Plan:  Enterococcal bacteremia = currently on ampicillin. TEE  is negative for vegetation. CNS lesions are not c/w septic emboli but more with multiple hemorrhagic mets from underlying metastatic melanoma. Repeat blood cx on 8/12 are NGTD. recommend to treat for 2 wk only, monotherapy with ampicillin. Currently on day 7 of 14. End date on ampicillin is 8/24. Can pull picc after IV abtx but not sure if oncology would need vascular access  Bradycardia = improved,continue on telemetry. Once more alert, maybe of value to see if symptomatic when he is ambulating with assistance  +urine culture klebsiella = patient is likely colonized with klebsiella. Poor collection since many squamous cells in urine specimen. His current abtx regimen would cover uropathogen  Metastatic melanoma with CNS and pulmonary mets = patient's wife is concerned  that he is only getting 8 radiation treatments and thought they were supposed to have 58. Also has many questions regarding chemotherapy and oncology followup. Recommend to have appts set up prior to discharge so that nursing home can arrange transportation for evaluation/management of aggressive cancer. Continue with decadron to minimize edema.Defer to dr. Lisbeth Renshaw for steroid taper  Thrush = recommend to do 7 d course of swish and swallow   Will sign off  [facility], [facility] Cell: [phone] Pager: [phone]  12/12/2015, 3:13 PM

## 2015-12-12 NOTE — Care Management Important Message (Signed)
Important Message  Patient Details  Name: VIRGINIO MCAULEY MRN: [id number] Date of Birth: 08-24-1945   Medicare Important Message Given:  Yes    Camillo Flaming 12/12/2015, 10:30 AMImportant Message  Patient Details  Name: RAPHEAL KACZYNSKI MRN: [id number] Date of Birth: 02-Dec-1945   Medicare Important Message Given:  Yes    Camillo Flaming 12/12/2015, 10:30 AM

## 2015-12-13 ENCOUNTER — Ambulatory Visit
Admit: 2015-12-13 | Discharge: 2015-12-13 | Disposition: A | Discharge status: Home / Self Care | Payer: Medicare Other | Attending: Radiation Oncology | Admitting: Radiation Oncology

## 2015-12-13 ENCOUNTER — Ambulatory Visit: Payer: Medicare Other

## 2015-12-13 LAB — CBC WITH DIFFERENTIAL/PLATELET
BASOS ABS: 0 10*3/uL (ref 0.0–0.1)
BASOS PCT: 0 %
Eosinophils Absolute: 0 10*3/uL (ref 0.0–0.7)
Eosinophils Relative: 0 %
HEMATOCRIT: 41.6 % (ref 39.0–52.0)
HEMOGLOBIN: 14.5 g/dL (ref 13.0–17.0)
LYMPHS PCT: 6 %
Lymphs Abs: 1 10*3/uL (ref 0.7–4.0)
MCH: 30.6 pg (ref 26.0–34.0)
MCHC: 34.9 g/dL (ref 30.0–36.0)
MCV: 87.8 fL (ref 78.0–100.0)
MONO ABS: 1 10*3/uL (ref 0.1–1.0)
Monocytes Relative: 5 %
NEUTROS ABS: 16.7 10*3/uL — AB (ref 1.7–7.7)
NEUTROS PCT: 89 %
Platelets: 192 10*3/uL (ref 150–400)
RBC: 4.74 MIL/uL (ref 4.22–5.81)
RDW: 13.6 % (ref 11.5–15.5)
WBC: 18.7 10*3/uL — AB (ref 4.0–10.5)

## 2015-12-13 LAB — COMPREHENSIVE METABOLIC PANEL
ALBUMIN: 2.6 g/dL — AB (ref 3.5–5.0)
ALT: 37 U/L (ref 17–63)
AST: 35 U/L (ref 15–41)
Alkaline Phosphatase: 75 U/L (ref 38–126)
Anion gap: 6 (ref 5–15)
BILIRUBIN TOTAL: 1.4 mg/dL — AB (ref 0.3–1.2)
BUN: 24 mg/dL — AB (ref 6–20)
CO2: 26 mmol/L (ref 22–32)
CREATININE: 0.88 mg/dL (ref 0.61–1.24)
Calcium: 8.1 mg/dL — ABNORMAL LOW (ref 8.9–10.3)
Chloride: 102 mmol/L (ref 101–111)
GFR calc Af Amer: >60 mL/min (ref >60)
GFR calc non Af Amer: >60 mL/min (ref >60)
GLUCOSE: 124 mg/dL — AB (ref 65–99)
POTASSIUM: 3.8 mmol/L (ref 3.5–5.1)
Sodium: 134 mmol/L — ABNORMAL LOW (ref 135–145)
TOTAL PROTEIN: 5.4 g/dL — AB (ref 6.5–8.1)

## 2015-12-13 LAB — MAGNESIUM: Magnesium: 1.9 mg/dL (ref 1.7–2.4)

## 2015-12-13 MED ORDER — TAMSULOSIN HCL 0.4 MG PO CAPS: 0.4000 mg | ORAL_CAPSULE | Freq: Every day | ORAL | 0 refills | Status: AC

## 2015-12-13 MED ORDER — AMPICILLIN SODIUM 2 G IJ SOLR: 2.0000 g | INTRAMUSCULAR | 0 refills | Status: AC

## 2015-12-13 MED ORDER — CALCIUM CARBONATE 1250 MG/5ML PO SUSP: 500.0000 mg | Freq: Three times a day (TID) | ORAL | 0 refills | Status: DC

## 2015-12-13 NOTE — Progress Notes (Signed)
Patient is set to discharge back to [facility] today. Patient & wife, Chad Ball made aware. Discharge packet given to RN, Archer Asa. [facility] will be called for transport once back from radiation.     Raynaldo Opitz, [facility] Clinical Social Worker cell #: [phone]

## 2015-12-13 NOTE — Progress Notes (Signed)
[facility] called for transport.     Cloris Flippo, LCSW [facility] Clinical Social Worker cell #: [phone]  

## 2015-12-13 NOTE — Progress Notes (Addendum)
Report given to Bonnielee Haff, RN at [facility]  Belongings and glasses given to spouse who was at bedside.

## 2015-12-13 NOTE — Discharge Summary (Addendum)
[facility] Discharge Summary   Patient: Chad Ball VZC:[id number]   PCP: Chad Lopes, MD DOB: 09-15-1945   Date of admission: 12/06/2015   Date of discharge:  12/13/2015    Discharge Diagnoses:  Principal Problem:   Sepsis due to urinary tract infection [facility]) Active Problems:   Essential hypertension   Hyperlipidemia   CAD in native artery   Brain metastasis (HCC)   Metastatic melanoma (HCC)   Sepsis ([place])   Hyperglycemia   Acute encephalopathy   Hypocalcemia   Hypoalbuminemia   Nonspecific abnormal electrocardiogram (ECG) (EKG)   Sepsis due to enterococcus (HCC)   Septic shock (HCC)   Acute endocarditis   Septic embolism (HCC)   Normocytic anemia   Hypokalemia   UTI (lower urinary tract infection)   Altered mental status  Admitted From: Nursing home Disposition:  SNF  Recommendations for Outpatient Follow-up:  1. Follow-up with PCP and get CBC and BMP checked, decide regarding blood pressure as well as calcium supplementation. 2. Establish care with hematology oncology, I have discussed with Chad Ball 3. Complete 2 more cycles of radiation on Monday and Tuesday.   Follow-up Information    Chad Lopes, MD .   Specialty:  Internal Medicine Why:  Get BMP and CBC checked, regarding blood pressure medication as well as calcium Contact information: [place] [address] [phone]        Chad Eisenmenger, MD. Schedule an appointment as soon as possible for a visit in 1 week(s).   Specialty:  Hematology and Oncology Contact information: [place] [address] [phone]          Diet recommendation: Cardiac diet  Activity: The patient is advised to gradually reintroduce usual activities.  Discharge Condition: good  Code Status: Full code  History of present illness: As per the H and P dictated on admission, "Chad Ball  is a 70 y.o.malewith history of CAD w/ MI s/p stent, arthritis, GERD,  and a diagnosis of melanoma on the chest status post resection 1 month Ago, subsequently found to have metastatic brain lesions, he was diagnosed in [place], A CT of the head revealed multiple intracranial lesions. MRI brain confirmed these findings, including at least 2 separate lesions in the right cerebellum and bilateral temporal lobe lesions. CT of the chest abdomen and pelvis showed 2 low-density nodules in the liver, most likely cysts, and a CT of the chest showed a 2.4 x 1.8 cm left upper lobe nodule, with pathologic lymphadenopathy in the left hilum.  Since he had family in [place] he was transferred to [place] one week ago and recently treated at [facility] and discharged one day ago to [place] home, at that time it was decided the patient will be treated with radiation treatments along with steroids for metastatic brain lesions of his melanoma. He had presented at that time with confusion, he was seen by neurosurgeon Chad Ball and radiation oncologist Chad Ball.  He now comes in from nursing home with low-grade fevers, low blood pressure and encephalopathy, in the ER found to have urosepsis, patient is encephalopathic but says no to all questions and denies any headache chest or abdominal pain. Does have leukocytosis and UA suggestive of UTI. I was called to admit him for sepsis."  Hospital Course:  Summary of his active problems in the hospital is as following. 1. Enterococcal sepsis due to complicated urinary tract infection ([place]) complicated by acute encephalopathy, rule out endocarditis chronic  indwelling Foley with evidence of urinary retention (bladder scan performed with 100 mL of residual urine noted).  Continue Flomax. Sepsis criteria met on admission, Blood pressure improved, lactate has cleared. Broad-spectrum antibiotics with vancomycin/Zosyn were initiated in the ED. His antibiotics were changed to ampicillin and gentamicin by ID. Since he is  chronically on Decadron for his brain metastasis, stress dose steroids were initiated as well, But now back on oral Decadron which has better bioavailability in the brain. Blood cultures are positive for enterococcus. ID recommended TEE to rule out endocarditis. Which was negative for vegetation and gentamicin discontinued. Continue ampicillin. Last dose on 12/19/2015. PICC line inserted.  Klebsiella UTI Ampicillin resistant, but sensitive to gentamycin. As it appears to be colonizer, infectious disease favors not broadening ampicillin to Unasyn.   Active Problems: Normocytic anemia Hemoglobin currently stable.  Hypokalemia Stable  Hypocalcemia/hypoalbuminemia Continue calcium carbonate supplements. Recheck in one week  Sinus bradycardia and demand ischemia currently resolved TSH low, so hypothyroidism not likely the cause of his bradycardia. Continue to replace calcium as low calcium can be associated with heart rhythm abnormalities. Atropine ordered as needed.12-lead EKG showed anterior T-wave inversions. Troponins elevated with downward trend. Cardiology following with thought that patient's bradycardia might be related to increased intracranial hypertension.  Hyperglycemia/prediabetes Likely steroid-induced. Hemoglobin A1c is 6%, consistent with prediabetes.  Essential hypertension Blood pressure stable, now off IV fluids.  Hyperlipidemia/CAD in native artery/abnormal EKG Continue aspirin and Lipitor. Troponins elevated but trend is down. Cardiology following. ST & T wave abnormalities on EKG worrisome for anterolateral ischemia. 2-D echo did not show any evidence of regional wall motion abnormalities.  Metastatic melanoma (HCC)/brain metastasis/suspected increased intracranial pressure Continue Decadron and Keppra. Have discussed with neurologist, Chad Ball, (thinks brain lesions are more consistent with metastatic melanoma rather than septic  emboli) and Chad Ball.  earlier provider have spoke with Chad Ball who feels that the patient's brain masses are MOST consistent with metastasis given the the presence of blood products near the metastasis, which is a fairly common feature of melanoma involving the brain. At this point, would continue Decadron and proceed with radiation therapy as planned. Consider palliative care consultation given prognosis (family is still coping with new diagnosis is not yet ready for palliative discussion, prefers to discuss with oncologist). Spoke with Chad Ball who will see the patient in the hospital. Patient started on palliative radiation. I spoke with Chad Ball and patient has a follow-up appointment to establish care with medical oncology as well.  Leukocytosis. Steroid-induced. No evidence of active infection  All other chronic medical condition were stable during the hospitalization.  Patient was seen by physical therapy, who recommended SNF, which was arranged by Education officer, museum and case Freight forwarder. On the day of the discharge the patient's vitals were stable, and no other acute medical condition were reported by patient. the patient was felt safe to be discharge at SNF with therapy.  Procedures and Results:  PICC line insertion,   Radiation,  Echocardiogram, Study Conclusions  - Left ventricle: The cavity size was normal. Wall thickness was   normal. Systolic function was normal. The estimated ejection   fraction was in the range of 60% to 65%. Wall motion was normal;   there were no regional wall motion abnormalities. Doppler   parameters are consistent with abnormal left ventricular   relaxation (grade 1 diastolic dysfunction). - Aortic valve: There was moderate regurgitation. Valve area (VTI):   2.41 cm^2. Valve area (Vmax): 2.27 cm^2. Valve area (Vmean):  2.18   cm^2. - Aorta: Aortic root dimension: 44 mm (ED). Ascending aortic   diameter: 48 mm (S). - Aortic root: The aortic root  was mildly dilated. - Ascending aorta: The ascending aorta was moderately dilated. - Left atrium: The atrium was mildly dilated. - Technically difficult study. If high suspicion of valvular   endocarditis consider TEE.   TEE Study Conclusions  - Left ventricle: The cavity size was normal. Wall thickness was   normal. Systolic function was normal. - Aortic valve: No evidence of vegetation. There was mild to   moderate regurgitation. - Mitral valve: No evidence of vegetation. There was mild   regurgitation. - Left atrium: No evidence of thrombus in the atrial cavity or   appendage. - Tricuspid valve: No evidence of vegetation. - Pulmonic valve: No evidence of vegetation.   Consultations:  Radiation oncology, cardiology, critical care  DISCHARGE MEDICATION: Current Discharge Medication List    START taking these medications   Details  ampicillin 2 g in sodium chloride 0.9 % 50 mL Inject 2 g into the vein every 4 (four) hours. Qty: 36 ampule, Refills: 0    calcium carbonate, dosed in mg elemental calcium, 1250 (500 Ca) MG/5ML Take 5 mLs (500 mg of elemental calcium total) by mouth 3 (three) times daily. Qty: 450 mL, Refills: 0    tamsulosin (FLOMAX) 0.4 MG CAPS capsule Take 1 capsule (0.4 mg total) by mouth daily. Qty: 30 capsule, Refills: 0      CONTINUE these medications which have NOT CHANGED   Details  acetaminophen (TYLENOL) 325 MG tablet Take 650 mg by mouth every 6 (six) hours as needed for moderate pain or headache.     aspirin 81 MG chewable tablet Chew 81 mg by mouth daily.    atorvastatin (LIPITOR) 80 MG tablet Take 80 mg by mouth every evening.     dexamethasone (DECADRON) 4 MG tablet Take 4 mg by mouth 4 (four) times daily.    feeding supplement, ENSURE ENLIVE, (ENSURE ENLIVE) LIQD Take 237 mLs by mouth 3 (three) times daily between meals.    levETIRAcetam (KEPPRA) 750 MG tablet Take 1 tablet (750 mg total) by mouth 2 (two) times daily.    Multiple  Vitamin (MULTIVITAMIN WITH MINERALS) TABS tablet Take 1 tablet by mouth daily.    nitroGLYCERIN (NITROSTAT) 0.4 MG SL tablet Place 0.4 mg under the tongue every 5 (five) minutes as needed for chest pain.    omeprazole (PRILOSEC) 20 MG capsule Take 20 mg by mouth daily before breakfast.     oxyCODONE (OXY IR/ROXICODONE) 5 MG immediate release tablet Take 5-10 mg by mouth every 4 (four) hours as needed for moderate pain or severe pain.    polyethylene glycol (MIRALAX / GLYCOLAX) packet Take 17 g by mouth daily.    senna-docusate (SENOKOT-S) 8.6-50 MG tablet Take 1 tablet by mouth daily.      STOP taking these medications     carvedilol (COREG) 3.125 MG tablet      losartan (COZAAR) 25 MG tablet        Allergies  Allergen Reactions  . Food Anaphylaxis and Other (See Comments)    Pt is allergic to [place] nuts.    Discharge Instructions    Diet - low sodium heart healthy    Complete by:  As directed   Increase activity slowly    Complete by:  As directed     Discharge Exam: Filed Weights   12/11/15 0300 12/12/15 0532 12/13/15 4259  Weight: 73.7 kg (162 lb 7.7 oz) 72.3 kg (159 lb 8 oz) 71.9 kg (158 lb 9.6 oz)   Vitals:   12/12/15 2125 12/13/15 0453  BP: 122/85 117/72  Pulse: 83 64  Resp: 18 16  Temp: 97.8 F (36.6 C) 98.1 F (36.7 C)   General: Appear in mild distress, no Rash; Oral Mucosa moist. Cardiovascular: S1 and S2 Present, no Murmur, no JVD Respiratory: Bilateral Air entry present and Clear to Auscultation, no Crackles, no wheezes Abdomen: Bowel Sound present, Soft and no tenderness Extremities: no Pedal edema, no calf tenderness Neurology: Grossly no focal neuro deficit.  The results of significant diagnostics from this hospitalization (including imaging, microbiology, ancillary and laboratory) are listed below for reference.    Significant Diagnostic Studies: Dg Chest 2 View  Result Date: 12/06/2015 CLINICAL DATA:  Fever; possible sepsis; chemo yesterday  for brain ca EXAM: CHEST  2 VIEW COMPARISON:  Chest CT, 11/27/2015 FINDINGS: Left upper lobe mass without significant change from the prior study. Remainder of the lungs is clear. No pleural effusion or pneumothorax. Cardiac silhouette is normal in size. No mediastinal or hilar masses or evidence of adenopathy. Bony thorax is intact. IMPRESSION: No acute cardiopulmonary disease. Electronically Signed   By: Lajean Manes M.D.   On: 12/06/2015 15:07   Ct Chest W Contrast  Result Date: 11/27/2015 CLINICAL DATA:  Metastatic melanoma. EXAM: CT CHEST, ABDOMEN, AND PELVIS WITH CONTRAST TECHNIQUE: Multidetector CT imaging of the chest, abdomen and pelvis was performed following the standard protocol during bolus administration of intravenous contrast. CONTRAST:  161m ISOVUE-300 IOPAMIDOL (ISOVUE-300) INJECTION 61% COMPARISON:  None. FINDINGS: CT CHEST FINDINGS Mediastinum/Lymph Nodes: The heart size appears normal. No pericardial effusion. Aortic atherosclerosis noted. LAD coronary artery calcification is identified. The trachea appears patent and is midline. Unremarkable appearance of the esophagus. No enlarged mediastinal, axillary or supraclavicular lymph nodes. Lungs/Pleura: No pleural effusion identified. Pulmonary nodule in the left upper lobe measures 2.4 cm, image 51 of series 4. Scarring noted within both lower lobes. Musculoskeletal: No chest wall mass or suspicious bone lesions identified. CT ABDOMEN PELVIS FINDINGS Hepatobiliary: There are 2 low-attenuation foci within the liver. The largest measure 9 mm. These are too small to reliably characterize. The gallbladder appears normal. No biliary dilatation. Pancreas: No mass, inflammatory changes, or other significant abnormality. Spleen: Within normal limits in size and appearance. Adrenals/Urinary Tract: Normal appearance of the adrenal glands. Striated nephrographic appearance of the right kidney is identified, image number 18 of series 8. The urinary  bladder appears normal. Stomach/Bowel: Small hiatal hernia. There is no pathologic dilatation of the small bowel loops. Numerous colonic diverticula are identified. No acute inflammation. Vascular/Lymphatic: Calcified atherosclerotic disease involves the abdominal aorta. No aneurysm. No enlarged upper abdominal lymph nodes identified. No pelvic or inguinal adenopathy. Reproductive: Prostate gland enlargement noted. Other: There is no ascites or focal fluid collections within the abdomen or pelvis. Musculoskeletal:  No aggressive lytic or sclerotic bone lesions. IMPRESSION: 1. Pulmonary nodule within the left upper lobe is identified and suspicious for metastases. 2. No specific findings identified to suggest metastatic disease to the abdomen or pelvis. 3. Striated nephrographic appearance of the right kidney. This is a nonspecific finding and may be seen with pyelonephritis as well as vascular abnormality such as embolic phenomenon. Clinical correlation suggested. Electronically Signed   By: TKerby MoorsM.D.   On: 11/27/2015 17:46   Mr BJeri CosWMHContrast  Result Date: 12/08/2015 CLINICAL DATA:  History of metastatic melanoma with  multiple brain lesions. Treated with steroids. Preadmission with possible sepsis 2 days ago. EXAM: MRI HEAD WITHOUT AND WITH CONTRAST TECHNIQUE: Multiplanar, multiecho pulse sequences of the brain and surrounding structures were obtained without and with intravenous contrast. CONTRAST:  37m MULTIHANCE GADOBENATE DIMEGLUMINE 529 MG/ML IV SOLN COMPARISON:  11/26/2011 FINDINGS: As demonstrated previously, there are multiple infra and supratentorial brain masses with associated hemorrhage consistent with metastatic melanoma with associated hemorrhage. There has been evolutionary maturation of the associated blood products, but there are no newly seen lesions, larger lesions or new hemorrhage. Mass effect is most prominent in the right frontal lobe with the largest lesion measures 5 cm  in diameter and is associated with vasogenic edema. Second largest lesion in the left temporal lobe is also similar, with associated vasogenic edema. Ventricular size is stable. No residual layering blood in the ventricles. No extra-axial collection. No evidence of new ischemic infarction. Major vessels at the base of the brain show flow. IMPRESSION: Multiple hemorrhagic metastatic melanoma lesions, approximately 25 in number. No change in the size, number or mass effect of the lesions. Hemorrhagic components are undergoing expected maturational changes in signal characteristics. Mass effect and edema associated with the 2 largest lesions, in the right frontal brain and in the left temporal brain appear quite similar. Electronically Signed   By: MNelson ChimesM.D.   On: 12/08/2015 18:30   Mr BJeri CosWGNContrast  Result Date: 11/26/2015 CLINICAL DATA:  70year old male with speech difficulty and imbalance. Symptoms started 1 week ago complaining of headache. History melanoma removed 1 month ago. [facility] [place] [facility] EXAM: MRI HEAD WITHOUT AND WITH CONTRAST TECHNIQUE: Multiplanar, multiecho pulse sequences of the brain and surrounding structures were obtained without and with intravenous contrast. CONTRAST:  134mMULTIHANCE GADOBENATE DIMEGLUMINE 529 MG/ML IV SOLN COMPARISON:  No comparison exams available. FINDINGS: Exam was performed under general anesthesia. Numerous intracranial masses within the supratentorial and infratentorial region numbering approximately 25 containing blood breakdown products or melanin suggestive of metastatic melanoma given patient's history. Some of the larger lesions include: Right anterior frontal 4.8 x 4.9 x 5 cm mass with marked surrounding vasogenic edema. Cannot exclude extension into the superior right orbital region as bone of the orbital roof may be destroyed. This causes significant mass effect upon the right frontal horn  of the lateral ventricular system which is displaced posteriorly and to the right. Left temporal lobe 4.8 x 3.2 x 3.1 cm mass with marked surrounding vasogenic edema and flattening of the left temporal horn. Superior right cerebellar 1.8 cm and 1.4 cm mass with surrounding vasogenic edema and mild compression the right lateral aspect of the fourth ventricle. Blood or proteinaceous material layering within the deep dependent aspect of the lateral ventricles suggesting breakthrough of hemorrhagic metastatic lesion into the ventricular system. Primary intracranial hemorrhage is a secondary less likely consideration. Abnormal appearance of the sulci on FLAIR imaging may be related to oxygen saturation secondary to the fact this was performed under general anesthesia limiting detection of the possibility of subarachnoid hemorrhage. In addition to significant distortion of the lateral ventricle by metastatic disease, mild hydrocephalus may be present. Major intracranial vascular structures are patent. No acute thrombotic infarct. IMPRESSION: Numerous intracranial masses within the supratentorial and infratentorial region numbering approximately 25 containing blood breakdown products or melanin suggestive of metastatic melanoma given patient's history. Some of the larger lesions include: Right anterior frontal 4.8 x 4.9 x 5 cm mass with marked surrounding vasogenic  edema. Cannot exclude extension into the superior right orbital region as bone of the orbital roof may be destroyed. This causes significant mass effect upon the right frontal horn of the lateral ventricular system which is displaced posteriorly and to the right. Left temporal lobe 4.8 x 3.2 x 3.1 cm mass with marked surrounding vasogenic edema and flattening of the left temporal horn. Superior right cerebellar 1.8 cm and 1.4 cm mass with surrounding vasogenic edema and mild compression the right lateral aspect of the fourth ventricle. Blood or proteinaceous  material layering within the deep dependent aspect of the lateral ventricles suggesting breakthrough of hemorrhagic metastatic lesion into the ventricular system. Primary intracranial hemorrhage is a secondary less likely consideration. Abnormal appearance of the sulci on FLAIR imaging may be related to oxygen saturation secondary to the fact this was performed under general anesthesia limiting detection of the possibility of subarachnoid hemorrhage. In addition to significant distortion of the lateral ventricle by metastatic disease, mild hydrocephalus may be present. These results will be called to the ordering clinician or representative by the Radiologist Assistant, and communication documented in the PACS or zVision Dashboard. Electronically Signed   By: Genia Del M.D.   On: 11/26/2015 15:32   Ct Abdomen Pelvis W Contrast  Result Date: 11/27/2015 CLINICAL DATA:  Metastatic melanoma. EXAM: CT CHEST, ABDOMEN, AND PELVIS WITH CONTRAST TECHNIQUE: Multidetector CT imaging of the chest, abdomen and pelvis was performed following the standard protocol during bolus administration of intravenous contrast. CONTRAST:  174m ISOVUE-300 IOPAMIDOL (ISOVUE-300) INJECTION 61% COMPARISON:  None. FINDINGS: CT CHEST FINDINGS Mediastinum/Lymph Nodes: The heart size appears normal. No pericardial effusion. Aortic atherosclerosis noted. LAD coronary artery calcification is identified. The trachea appears patent and is midline. Unremarkable appearance of the esophagus. No enlarged mediastinal, axillary or supraclavicular lymph nodes. Lungs/Pleura: No pleural effusion identified. Pulmonary nodule in the left upper lobe measures 2.4 cm, image 51 of series 4. Scarring noted within both lower lobes. Musculoskeletal: No chest wall mass or suspicious bone lesions identified. CT ABDOMEN PELVIS FINDINGS Hepatobiliary: There are 2 low-attenuation foci within the liver. The largest measure 9 mm. These are too small to reliably  characterize. The gallbladder appears normal. No biliary dilatation. Pancreas: No mass, inflammatory changes, or other significant abnormality. Spleen: Within normal limits in size and appearance. Adrenals/Urinary Tract: Normal appearance of the adrenal glands. Striated nephrographic appearance of the right kidney is identified, image number 18 of series 8. The urinary bladder appears normal. Stomach/Bowel: Small hiatal hernia. There is no pathologic dilatation of the small bowel loops. Numerous colonic diverticula are identified. No acute inflammation. Vascular/Lymphatic: Calcified atherosclerotic disease involves the abdominal aorta. No aneurysm. No enlarged upper abdominal lymph nodes identified. No pelvic or inguinal adenopathy. Reproductive: Prostate gland enlargement noted. Other: There is no ascites or focal fluid collections within the abdomen or pelvis. Musculoskeletal:  No aggressive lytic or sclerotic bone lesions. IMPRESSION: 1. Pulmonary nodule within the left upper lobe is identified and suspicious for metastases. 2. No specific findings identified to suggest metastatic disease to the abdomen or pelvis. 3. Striated nephrographic appearance of the right kidney. This is a nonspecific finding and may be seen with pyelonephritis as well as vascular abnormality such as embolic phenomenon. Clinical correlation suggested. Electronically Signed   By: TKerby MoorsM.D.   On: 11/27/2015 17:46   Mr Liver W WZOContrast  Result Date: 12/03/2015 CLINICAL DATA:  Metastatic melanoma.  Evaluate liver lesion. EXAM: MRI ABDOMEN WITHOUT AND WITH CONTRAST TECHNIQUE: Multiplanar multisequence MR  imaging of the abdomen was performed both before and after the administration of intravenous contrast. CONTRAST:  16 cc MultiHance COMPARISON:  CT 11/27/2015 FINDINGS: Moderate motion degradation throughout. The initial series, including the TT respiratory triggered axials, are of moderate to good quality. The later series,  including the pre and postcontrast dynamic, are severely motion degraded and essentially nondiagnostic. Lower chest: Normal heart size without pericardial or pleural effusion. Incidental note is made of ascending aortic dilatation, including at 4.5 cm on image 2/series 1802. Hepatobiliary: The 2 left hepatic lobe lesions are markedly T2 hyperintense on image 16/ series 3 and demonstrate no significant post-contrast enhancement. Most consistent with simple cysts. Larger is in the lateral segment left liver lobe at 8 mm. No suspicious liver lesion. Normal gallbladder, without biliary ductal dilatation. Pancreas: Normal, without mass or ductal dilatation. Spleen: Normal in size, without focal abnormality. Adrenals/Urinary Tract: Normal adrenal glands. Tiny bilateral renal lesions which are likely cysts. Heterogeneous T2 signal within the interpolar right kidney, including wedge-shaped area of T2 hypo intensity on image 28/ series 3. Concurrent hypo enhancement. No hydronephrosis. Stomach/Bowel: Small hiatal hernia. Otherwise normal stomach and abdominal bowel loops. Vascular/Lymphatic: Normal caliber of the aorta and branch vessels. Retroaortic left renal vein. Right renal vein grossly patent. No retroperitoneal or retrocrural adenopathy. Other: No ascites. Musculoskeletal: No acute osseous abnormality. IMPRESSION: 1. Moderate motion degradation. 2. The hepatic lesions are consistent with cysts. No evidence of hepatic metastasis. 3. Right renal abnormality is favored to represent pyelonephritis. Correlate with urinalysis. 4. Ascending aortic aneurysm, on the order of 4.5 cm. Recommend semi-annual imaging followup by CTA or MRA and referral to cardiothoracic surgery if not already obtained. This recommendation follows 2010 ACCF/AHA/AATS/ACR/ASA/SCA/SCAI/SIR/STS/SVM Guidelines for the Diagnosis and Management of Patients With Thoracic Aortic Disease. Circulation. 2010; 121: R116-F790 Electronically Signed   By: Abigail Miyamoto M.D.   On: 12/03/2015 08:25    Microbiology: Recent Results (from the past 240 hour(s))  Blood Culture (routine x 2)     Status: Abnormal   Collection Time: 12/06/15  1:33 PM  Result Value Ref Range Status   Specimen Description BLOOD LEFT HAND  Final   Special Requests BOTTLES DRAWN AEROBIC AND ANAEROBIC 5CC  Final   Culture  Setup Time   Final    GRAM POSITIVE COCCI IN CHAINS IN PAIRS IN BOTH AEROBIC AND ANAEROBIC BOTTLES CRITICAL RESULT CALLED TO, READ BACK BY AND VERIFIED WITH: L POINDEXTER PHARMD 0527 12/07/15 A BROWNING    Culture (A)  Final    ENTEROCOCCUS SPECIES SUSCEPTIBILITIES PERFORMED ON PREVIOUS CULTURE WITHIN THE LAST 5 DAYS. Performed at [facility]    Report Status 12/09/2015 FINAL  Final  Urine culture     Status: Abnormal   Collection Time: 12/06/15  2:00 PM  Result Value Ref Range Status   Specimen Description URINE, CLEAN CATCH  Final   Special Requests NONE  Final   Culture >=100,000 COLONIES/mL KLEBSIELLA PNEUMONIAE (A)  Final   Report Status 12/08/2015 FINAL  Final   Organism ID, Bacteria KLEBSIELLA PNEUMONIAE (A)  Final      Susceptibility   Klebsiella pneumoniae - MIC*    AMPICILLIN >=32 RESISTANT Resistant     CEFAZOLIN <=4 SENSITIVE Sensitive     CEFTRIAXONE <=1 SENSITIVE Sensitive     CIPROFLOXACIN <=0.25 SENSITIVE Sensitive     GENTAMICIN <=1 SENSITIVE Sensitive     IMIPENEM <=0.25 SENSITIVE Sensitive     NITROFURANTOIN 64 INTERMEDIATE Intermediate     TRIMETH/SULFA <=20 SENSITIVE  Sensitive     AMPICILLIN/SULBACTAM 4 SENSITIVE Sensitive     PIP/TAZO <=4 SENSITIVE Sensitive     Extended ESBL NEGATIVE Sensitive     * >=100,000 COLONIES/mL KLEBSIELLA PNEUMONIAE  Blood Culture (routine x 2)     Status: Abnormal   Collection Time: 12/06/15  2:37 PM  Result Value Ref Range Status   Specimen Description BLOOD RIGHT HAND  Final   Special Requests BOTTLES DRAWN AEROBIC AND ANAEROBIC 5 CC EA  Final   Culture  Setup Time   Final     GRAM POSITIVE COCCI IN CHAINS IN PAIRS IN BOTH AEROBIC AND ANAEROBIC BOTTLES CRITICAL RESULT CALLED TO, READ BACK BY AND VERIFIED WITHElenore Paddy PHARMD [id number] 12/07/15 A BROWNING Performed at [facility] (A)  Final   Report Status 12/09/2015 FINAL  Final   Organism ID, Bacteria ENTEROCOCCUS SPECIES  Final      Susceptibility   Enterococcus species - MIC*    AMPICILLIN <=2 SENSITIVE Sensitive     VANCOMYCIN 1 SENSITIVE Sensitive     GENTAMICIN SYNERGY SENSITIVE Sensitive     * ENTEROCOCCUS SPECIES  Blood Culture ID Panel (Reflexed)     Status: Abnormal   Collection Time: 12/06/15  2:37 PM  Result Value Ref Range Status   Enterococcus species DETECTED (A) NOT DETECTED Final    Comment: CRITICAL RESULT CALLED TO, READ BACK BY AND VERIFIED WITH: L POINDEXTER PHARMD 0527 12/07/15 A BROWNING    Vancomycin resistance NOT DETECTED NOT DETECTED Final   Listeria monocytogenes NOT DETECTED NOT DETECTED Final   Staphylococcus species NOT DETECTED NOT DETECTED Final   Staphylococcus aureus NOT DETECTED NOT DETECTED Final   Methicillin resistance NOT DETECTED NOT DETECTED Final   Streptococcus species NOT DETECTED NOT DETECTED Final   Streptococcus agalactiae NOT DETECTED NOT DETECTED Final   Streptococcus pneumoniae NOT DETECTED NOT DETECTED Final   Streptococcus pyogenes NOT DETECTED NOT DETECTED Final   Acinetobacter baumannii NOT DETECTED NOT DETECTED Final   Enterobacteriaceae species NOT DETECTED NOT DETECTED Final   Enterobacter cloacae complex NOT DETECTED NOT DETECTED Final   Escherichia coli NOT DETECTED NOT DETECTED Final   Klebsiella oxytoca NOT DETECTED NOT DETECTED Final   Klebsiella pneumoniae NOT DETECTED NOT DETECTED Final   Proteus species NOT DETECTED NOT DETECTED Final   Serratia marcescens NOT DETECTED NOT DETECTED Final   Carbapenem resistance NOT DETECTED NOT DETECTED Final   Haemophilus influenzae NOT DETECTED NOT DETECTED Final    Neisseria meningitidis NOT DETECTED NOT DETECTED Final   Pseudomonas aeruginosa NOT DETECTED NOT DETECTED Final   Candida albicans NOT DETECTED NOT DETECTED Final   Candida glabrata NOT DETECTED NOT DETECTED Final   Candida krusei NOT DETECTED NOT DETECTED Final   Candida parapsilosis NOT DETECTED NOT DETECTED Final   Candida tropicalis NOT DETECTED NOT DETECTED Final    Comment: Performed at [facility]  MRSA PCR Screening     Status: None   Collection Time: 12/06/15  5:30 PM  Result Value Ref Range Status   MRSA by PCR NEGATIVE NEGATIVE Final    Comment:        The GeneXpert MRSA Assay (FDA approved for NASAL specimens only), is one component of a comprehensive MRSA colonization surveillance program. It is not intended to diagnose MRSA infection nor to guide or monitor treatment for MRSA infections.   Culture, blood (Routine X 2) w Reflex to ID Panel     Status: None  Collection Time: 12/07/15  9:47 AM  Result Value Ref Range Status   Specimen Description BLOOD LEFT HAND  Final   Special Requests IN PEDIATRIC BOTTLE [place]  Final   Culture   Final    NO GROWTH 5 DAYS Performed at [facility]    Report Status 12/12/2015 FINAL  Final  Culture, blood (Routine X 2) w Reflex to ID Panel     Status: None   Collection Time: 12/07/15 10:35 AM  Result Value Ref Range Status   Specimen Description BLOOD LEFT HAND  Final   Special Requests Immunocompromised  Final   Culture   Final    NO GROWTH 5 DAYS Performed at [facility]    Report Status 12/12/2015 FINAL  Final     Labs: CBC:  Recent Labs Lab 12/06/15 1436  12/08/15 0240 12/09/15 0400 12/10/15 0329 12/11/15 0658 12/13/15 0407  WBC 20.3*  < > 10.8* 9.7 13.0* 13.4* 18.7*  NEUTROABS 17.5*  --   --   --   --  11.3* 16.7*  HGB 15.7  < > 10.8* 12.4* 14.1 13.4 14.5  HCT 44.6  < > 31.2* 35.8* 41.0 38.8* 41.6  MCV 88.0  < > 88.1 87.3 87.2 87.6 87.8  PLT 115*  < > 78* 102* 127* 146* 192  < > =  values in this interval not displayed. Basic Metabolic Panel:  Recent Labs Lab 12/07/15 1147  12/09/15 0400 12/10/15 0329 12/11/15 0658 12/12/15 0528 12/13/15 0407  NA  --   < > 136 136 133* 134* 134*  K  --   < > 3.5 3.7 3.9 4.9 3.8  CL  --   < > 104 101 100* 100* 102  CO2  --   < > '25 26 26 25 26  ' GLUCOSE  --   < > 120* 125* 118* 122* 124*  BUN  --   < > 17 21* 21* 25* 24*  CREATININE  --   < > 0.68 0.86 0.84 1.06 0.88  CALCIUM  --   < > 8.8* 8.5* 8.0* 8.5* 8.1*  MG 1.6*  --   --   --   --  1.9 1.9  PHOS 2.9  --   --   --  3.7  --   --   < > = values in this interval not displayed. Liver Function Tests:  Recent Labs Lab 12/06/15 1436 12/11/15 0658 12/13/15 0407  AST 34  --  35  ALT 30  --  37  ALKPHOS 95  --  75  BILITOT 1.2  --  1.4*  PROT 6.1*  --  5.4*  ALBUMIN 2.6* 2.6* 2.6*   No results for input(s): LIPASE, AMYLASE in the last 168 hours. No results for input(s): AMMONIA in the last 168 hours. Cardiac Enzymes:  Recent Labs Lab 12/07/15 1828 12/07/15 2317 12/08/15 0838 12/08/15 1509 12/08/15 2124  TROPONINI 0.12* 0.09* 0.07* 0.07* 0.07*   BNP (last 3 results)  Recent Labs  12/07/15 1900  BNP 270.6*   CBG: No results for input(s): GLUCAP in the last 168 hours. Time spent: 30 minutes  Signed:  Berle Mull  [facility]  12/13/2015  , 12:18 PM

## 2015-12-13 NOTE — Progress Notes (Signed)
CSW following for return to [facility] when medically ready. CSW has completed FL2 & will continue to follow and assist with return.    Raynaldo Opitz, [facility] Clinical Social Worker cell #: [phone]

## 2015-12-16 ENCOUNTER — Ambulatory Visit: Payer: Medicare Other

## 2015-12-16 ENCOUNTER — Telehealth: Payer: Self-pay | Admitting: *Deleted

## 2015-12-16 ENCOUNTER — Ambulatory Visit
Admission: RE | Admit: 2015-12-16 | Discharge: 2015-12-16 | Disposition: A | Discharge status: Home / Self Care | Payer: Medicare Other | Source: Ambulatory Visit | Attending: Radiation Oncology | Admitting: Radiation Oncology

## 2015-12-16 NOTE — Telephone Encounter (Signed)
Kasie with [facility] in [place] calling in reference to this patient's radiation appointment.  We had a problem with transportation.  They're here now and we want to make sure he can still receive treatment today."  Called [facility] ext: [phone] and received authorization by [facility].  Kasie notified to transport to Radiation.

## 2015-12-17 ENCOUNTER — Other Ambulatory Visit: Payer: Self-pay | Admitting: Radiation Oncology

## 2015-12-17 ENCOUNTER — Encounter: Payer: Self-pay | Admitting: Radiation Oncology

## 2015-12-17 ENCOUNTER — Ambulatory Visit: Payer: Medicare Other

## 2015-12-17 ENCOUNTER — Ambulatory Visit
Admission: RE | Admit: 2015-12-17 | Discharge: 2015-12-17 | Disposition: A | Discharge status: Home / Self Care | Payer: Medicare Other | Source: Ambulatory Visit | Attending: Radiation Oncology | Admitting: Radiation Oncology

## 2015-12-17 ENCOUNTER — Encounter (HOSPITAL_COMMUNITY): Payer: Self-pay | Admitting: Emergency Medicine

## 2015-12-17 ENCOUNTER — Inpatient Hospital Stay (HOSPITAL_COMMUNITY)
Admission: EM | Admit: 2015-12-17 | Discharge: 2015-12-27 | DRG: 872 | Disposition: E | Payer: Medicare Other | Attending: Internal Medicine | Admitting: Internal Medicine

## 2015-12-17 ENCOUNTER — Emergency Department (HOSPITAL_COMMUNITY): Payer: Medicare Other

## 2015-12-17 DIAGNOSIS — M545 Low back pain, unspecified: Secondary | ICD-10-CM

## 2015-12-17 DIAGNOSIS — Z515 Encounter for palliative care: Secondary | ICD-10-CM

## 2015-12-17 DIAGNOSIS — I2510 Atherosclerotic heart disease of native coronary artery without angina pectoris: Secondary | ICD-10-CM | POA: Diagnosis present

## 2015-12-17 DIAGNOSIS — Z79899 Other long term (current) drug therapy: Secondary | ICD-10-CM

## 2015-12-17 DIAGNOSIS — C4359 Malignant melanoma of other part of trunk: Secondary | ICD-10-CM | POA: Diagnosis present

## 2015-12-17 DIAGNOSIS — M549 Dorsalgia, unspecified: Secondary | ICD-10-CM | POA: Diagnosis present

## 2015-12-17 DIAGNOSIS — C439 Malignant melanoma of skin, unspecified: Secondary | ICD-10-CM | POA: Diagnosis present

## 2015-12-17 DIAGNOSIS — Z7189 Other specified counseling: Secondary | ICD-10-CM

## 2015-12-17 DIAGNOSIS — Z7982 Long term (current) use of aspirin: Secondary | ICD-10-CM

## 2015-12-17 DIAGNOSIS — C7800 Secondary malignant neoplasm of unspecified lung: Secondary | ICD-10-CM | POA: Diagnosis present

## 2015-12-17 DIAGNOSIS — N179 Acute kidney failure, unspecified: Secondary | ICD-10-CM | POA: Diagnosis present

## 2015-12-17 DIAGNOSIS — I10 Essential (primary) hypertension: Secondary | ICD-10-CM | POA: Diagnosis present

## 2015-12-17 DIAGNOSIS — A4181 Sepsis due to Enterococcus: Secondary | ICD-10-CM | POA: Diagnosis not present

## 2015-12-17 DIAGNOSIS — D72829 Elevated white blood cell count, unspecified: Secondary | ICD-10-CM | POA: Diagnosis present

## 2015-12-17 DIAGNOSIS — Z91018 Allergy to other foods: Secondary | ICD-10-CM

## 2015-12-17 DIAGNOSIS — E785 Hyperlipidemia, unspecified: Secondary | ICD-10-CM | POA: Diagnosis present

## 2015-12-17 DIAGNOSIS — I252 Old myocardial infarction: Secondary | ICD-10-CM

## 2015-12-17 DIAGNOSIS — C799 Secondary malignant neoplasm of unspecified site: Secondary | ICD-10-CM | POA: Diagnosis present

## 2015-12-17 DIAGNOSIS — K219 Gastro-esophageal reflux disease without esophagitis: Secondary | ICD-10-CM | POA: Diagnosis present

## 2015-12-17 DIAGNOSIS — C7931 Secondary malignant neoplasm of brain: Secondary | ICD-10-CM | POA: Diagnosis present

## 2015-12-17 DIAGNOSIS — Z8249 Family history of ischemic heart disease and other diseases of the circulatory system: Secondary | ICD-10-CM

## 2015-12-17 DIAGNOSIS — Z7952 Long term (current) use of systemic steroids: Secondary | ICD-10-CM

## 2015-12-17 DIAGNOSIS — Z66 Do not resuscitate: Secondary | ICD-10-CM | POA: Diagnosis present

## 2015-12-17 DIAGNOSIS — E869 Volume depletion, unspecified: Secondary | ICD-10-CM | POA: Diagnosis present

## 2015-12-17 DIAGNOSIS — Z8679 Personal history of other diseases of the circulatory system: Secondary | ICD-10-CM

## 2015-12-17 DIAGNOSIS — I959 Hypotension, unspecified: Secondary | ICD-10-CM | POA: Diagnosis not present

## 2015-12-17 DIAGNOSIS — R4182 Altered mental status, unspecified: Secondary | ICD-10-CM | POA: Diagnosis present

## 2015-12-17 DIAGNOSIS — C787 Secondary malignant neoplasm of liver and intrahepatic bile duct: Secondary | ICD-10-CM | POA: Diagnosis present

## 2015-12-17 DIAGNOSIS — Z87440 Personal history of urinary (tract) infections: Secondary | ICD-10-CM

## 2015-12-17 LAB — COMPREHENSIVE METABOLIC PANEL
ALK PHOS: 83 U/L (ref 38–126)
ALT: 57 U/L (ref 17–63)
ANION GAP: 11 (ref 5–15)
AST: 49 U/L — ABNORMAL HIGH (ref 15–41)
Albumin: 2.6 g/dL — ABNORMAL LOW (ref 3.5–5.0)
BILIRUBIN TOTAL: 2 mg/dL — AB (ref 0.3–1.2)
BUN: 34 mg/dL — ABNORMAL HIGH (ref 6–20)
CALCIUM: 8.2 mg/dL — AB (ref 8.9–10.3)
CO2: 21 mmol/L — ABNORMAL LOW (ref 22–32)
Chloride: 104 mmol/L (ref 101–111)
Creatinine, Ser: 0.86 mg/dL (ref 0.61–1.24)
Glucose, Bld: 221 mg/dL — ABNORMAL HIGH (ref 65–99)
Potassium: 4 mmol/L (ref 3.5–5.1)
SODIUM: 136 mmol/L (ref 135–145)
TOTAL PROTEIN: 5.4 g/dL — AB (ref 6.5–8.1)

## 2015-12-17 LAB — CBC WITH DIFFERENTIAL/PLATELET
Basophils Absolute: 0 10*3/uL (ref 0.0–0.1)
Basophils Relative: 0 %
EOS ABS: 0 10*3/uL (ref 0.0–0.7)
Eosinophils Relative: 0 %
HEMATOCRIT: 41.8 % (ref 39.0–52.0)
HEMOGLOBIN: 14.1 g/dL (ref 13.0–17.0)
LYMPHS ABS: 0.9 10*3/uL (ref 0.7–4.0)
Lymphocytes Relative: 4 %
MCH: 30.1 pg (ref 26.0–34.0)
MCHC: 33.7 g/dL (ref 30.0–36.0)
MCV: 89.1 fL (ref 78.0–100.0)
MONOS PCT: 4 %
Monocytes Absolute: 0.9 10*3/uL (ref 0.1–1.0)
NEUTROS PCT: 92 %
Neutro Abs: 23 10*3/uL — ABNORMAL HIGH (ref 1.7–7.7)
Platelets: 140 10*3/uL — ABNORMAL LOW (ref 150–400)
RBC: 4.69 MIL/uL (ref 4.22–5.81)
RDW: 14.4 % (ref 11.5–15.5)
WBC: 24.8 10*3/uL — ABNORMAL HIGH (ref 4.0–10.5)

## 2015-12-17 MED ORDER — ONDANSETRON HCL 4 MG/2ML IJ SOLN
4.0000 mg | Freq: Once | INTRAMUSCULAR | Status: AC | PRN
  Administered 2015-12-17: 4 mg via INTRAVENOUS
  Filled 2015-12-17: qty 2

## 2015-12-17 MED ORDER — SODIUM CHLORIDE 0.9 % IV SOLN
INTRAVENOUS | Status: DC
  Administered 2015-12-17: 21:00:00 via INTRAVENOUS

## 2015-12-17 MED ORDER — MORPHINE SULFATE (PF) 2 MG/ML IV SOLN
2.0000 mg | Freq: Once | INTRAVENOUS | Status: AC
  Administered 2015-12-17: 2 mg via INTRAVENOUS
  Filled 2015-12-17: qty 1

## 2015-12-17 MED ORDER — MORPHINE SULFATE (PF) 4 MG/ML IV SOLN
6.0000 mg | Freq: Once | INTRAVENOUS | Status: AC
  Administered 2015-12-17: 6 mg via INTRAVENOUS
  Filled 2015-12-17: qty 2

## 2015-12-17 NOTE — ED Triage Notes (Signed)
Per EMS pt from [facility], patient only alert to self, baseline of confusion due to brain tumor. Pt here tonight c/o lumbar back pain since 3 pm today. No trauma or fall known. 118/78 HR 70 RR 18 98% RA CBG 137. Oral temp 97.7.

## 2015-12-17 NOTE — H&P (Signed)
[facility] History and Physical  Chad Ball [id number] DOB: [birth year] DOA: 12/26/2015  Referring physician: Dr Winfred Leeds PCP: [name], MD   Chief Complaint: Low back pain  HPI: Chad Ball is a 70 y.o. male with hx of metastatic melanoma presenting to ED with low back pain onset about 3 pm today.  He has baseline confusion due to brain mets.  No hx recent fall or trauma.  Plain film xrays showed some DJD, nothing acute.  Needs MRI , r/o spinal mets, asked to admit patient for this reason.    Patient poor historian, denies any pain at this time, has been treated w narcotics in ED.  No c/o's at this time.    Admits: July 30- Dec 05, 2015 > 47 yo had melanoma lesion on his chest resected about 1 month prior, was brought in with AMS w recent severe headaches x 1 week to [facility]; he was incoherent.  CT and MRI showed multiple lesions c/w metastatic disease.  He rec'd IV steroids and MS improved somewhat.  He was then transferred to [facility] because he lives in [place].  Patient was seen by surgery but was felt to be high risk for surgery, therefore plan was made for radiation +/- medical oncology management.  He transferred to [facility] and started radiation on 12/05/15.  He was dc'd to SNF per PT rec's.    Aug 11- 18, 2017 > septic due to UTI complicated by AMS; chronic indwelling foley w urinary retention. Rx with vanc/ zosyn, then changed to amp/ gent by ID. Grew enterococcus in blood cx's.  TEE neg for vegetation.  Norva Karvonen was stopped. Cont ampicillin through 8/24/17via PICC.  Had Klebs UTI resistant to Amp , ID thought this was colonizer.  For brain mets, neuro Dr Tasia Catchings rec'd to continue Decadron and proceed w radiation Rx.  Consider pall care consultation per DC summ, however family was coping with new diagnosis and not yet ready for pall care discussions.  Radiation therpay is palliative.    Past Medical History  Past Medical History:  Diagnosis Date  .  Arthritis   . Brain metastasis ([place]) 11/24/2015  . CAD in native artery 09/02/2015  . Essential hypertension 09/02/2015  . GERD (gastroesophageal reflux disease)   . Hyperlipidemia 09/02/2015  . Liver lesion   . Lung nodule   . Metastatic melanoma ([place])   . Myocardial infarction [facility])    Past Surgical History  Past Surgical History:  Procedure Laterality Date  . APPENDECTOMY    . HERNIA REPAIR    . RADIOLOGY WITH ANESTHESIA N/A 11/26/2015   Procedure: MRI BRAIN WITH WITH AND WITHOUT CONTRAST;  Surgeon: Medication Radiologist, MD;  Location: [facility];  Service: Radiology;  Laterality: N/A;  . TEE WITHOUT CARDIOVERSION N/A 12/10/2015   Procedure: TRANSESOPHAGEAL ECHOCARDIOGRAM (TEE);  Surgeon: Thayer Headings, MD;  Location: [facility] ENDOSCOPY;  Service: Cardiovascular;  Laterality: N/A;   Family History  Family History  Problem Relation Age of Onset  . Heart disease Mother   . Hyperlipidemia Mother    Social History  reports that he has never smoked. He has never used smokeless tobacco. His alcohol and drug histories are not on file. Allergies  Allergies  Allergen Reactions  . Food Anaphylaxis and Other (See Comments)    Pt is allergic to [place] nuts.    Home medications Prior to Admission medications   Medication Sig Start Date End Date Taking? Authorizing Provider  acetaminophen (TYLENOL) 325 MG tablet  Take 650 mg by mouth every 6 (six) hours as needed for moderate pain or headache.    Yes Historical Provider, MD  ampicillin 2 g in sodium chloride 0.9 % 50 mL Inject 2 g into the vein every 4 (four) hours. 12/13/15 12/19/15 Yes Lavina Hamman, MD  aspirin 81 MG chewable tablet Chew 81 mg by mouth daily.   Yes Historical Provider, MD  atorvastatin (LIPITOR) 80 MG tablet Take 80 mg by mouth at bedtime.    Yes Historical Provider, MD  calcium carbonate (OS-CAL) 1250 (500 Ca) MG chewable tablet Chew 1 tablet by mouth 3 (three) times daily.   Yes Historical Provider, MD  dexamethasone (DECADRON) 4 MG  tablet Take 4 mg by mouth 4 (four) times daily.   Yes Historical Provider, MD  feeding supplement, ENSURE ENLIVE, (ENSURE ENLIVE) LIQD Take 237 mLs by mouth 3 (three) times daily between meals. 12/05/15  Yes Mariel Aloe, MD  levETIRAcetam (KEPPRA) 750 MG tablet Take 1 tablet (750 mg total) by mouth 2 (two) times daily. 12/05/15  Yes Mariel Aloe, MD  Multiple Vitamin (MULTIVITAMIN WITH MINERALS) TABS tablet Take 1 tablet by mouth daily.   Yes Historical Provider, MD  nitroGLYCERIN (NITROSTAT) 0.4 MG SL tablet Place 0.4 mg under the tongue every 5 (five) minutes as needed for chest pain.   Yes Historical Provider, MD  omeprazole (PRILOSEC) 20 MG capsule Take 20 mg by mouth daily before breakfast.    Yes Historical Provider, MD  oxyCODONE (OXY IR/ROXICODONE) 5 MG immediate release tablet Take 5-10 mg by mouth every 4 (four) hours as needed for moderate pain or severe pain.   Yes Historical Provider, MD  polyethylene glycol (MIRALAX / GLYCOLAX) packet Take 17 g by mouth daily.   Yes Historical Provider, MD  senna-docusate (SENOKOT-S) 8.6-50 MG tablet Take 1 tablet by mouth daily.   Yes Historical Provider, MD  tamsulosin (FLOMAX) 0.4 MG CAPS capsule Take 1 capsule (0.4 mg total) by mouth daily. 12/13/15  Yes Lavina Hamman, MD   Liver Function Tests  Recent Labs Lab 12/11/15 [phone] 12/13/15 0407 11/29/2015 2103  AST  --  35 49*  ALT  --  37 57  ALKPHOS  --  75 83  BILITOT  --  1.4* 2.0*  PROT  --  5.4* 5.4*  ALBUMIN 2.6* 2.6* 2.6*   No results for input(s): LIPASE, AMYLASE in the last 168 hours. CBC  Recent Labs Lab 12/11/15 0658 12/13/15 0407 12/16/2015 2103  WBC 13.4* 18.7* 24.8*  NEUTROABS 11.3* 16.7* 23.0*  HGB 13.4 14.5 14.1  HCT 38.8* 41.6 41.8  MCV 87.6 87.8 89.1  PLT 146* 192 [id number]*   Basic Metabolic Panel  Recent Labs Lab 12/11/15 0658 12/12/15 0528 12/13/15 0407 11/29/2015 2103  NA 133* 134* 134* 136  K 3.9 4.9 3.8 4.0  CL 100* 100* 102 104  CO2 26 25 26  21*   GLUCOSE 118* 122* 124* 221*  BUN 21* 25* 24* 34*  CREATININE 0.84 1.06 0.88 0.86  CALCIUM 8.0* 8.5* 8.1* 8.2*  PHOS 3.7  --   --   --      Vitals:   11/26/2015 2100 12/22/2015 2200 12/02/2015 2300 12-28-2015 0001  BP: 127/86 132/81 131/86 126/83  Pulse: 86 74 91 (!) 107  Resp: 17 16 16 20   Temp:    97.9 F (36.6 C)  TempSrc:    Oral  SpO2: 95% 98% 94% 97%   Exam: Gen pt markedly fatigued, lethargic, responds to  simple commands, grips bilat No rash, cyanosis or gangrene Sclera anicteric, throat dry lips cracked No jvd or bruits, flat neck veins Chest clear bilat RRR no MRG Abd soft ntnd no mass or ascites +bs GU normal male w condom cath draining brownish cloudy urine MS no joint effusions or deformity Ext no LE or UE edema / no wounds or ulcers Neuro is lethargic, difficult to understand, moves bilat arms and left leg to command, does not move the R leg and R leg appears to be flaccid   Na 136  K 4  BUN 34  Cr 0.86   Ca 8.2  Gluc 221   Alb 2.6  Tbili  2.0  AST 49/ ALT 57 WBC 24k  Hb 14  plt 140  Lumbar spine plain film xray (today) > Aortic atherosclerosis. Moderate multilevel degenerative disc disease.  No acute abnormality seen in the lumbar spine.   MRI brain 12/08/15 > Multiple hemorrhagic metastatic melanoma lesions, approximately 25 in number. No change in the size, number or mass effect of the lesions. Hemorrhagic components are undergoing expected maturational changes in signal characteristics. Mass effect and edema associated with the 2 largest lesions, in the right frontal brain and in the left temporal brain appear quite similar.  Assessment: 1.  Back pain - not sure cause, will order MRI for Wed to r/o mets to spine. Not moving R leg.  2.  [place] - urine looks infected in the tube, need UA and culture, will start empiric Rocephin IV and get blood cx's also.  Stress dose steroids (on decadron).  3.  Vol depletion - very dry on exam, give IVF's.   4.  Metastatic melanoma  - on decadron and Keppra 5.  Recent enterococcus bacteremia - on ampicillin through Thursday 6.  AMS - multifactorial 7.  EOL - Poor prognosis, discussed w pt's wife > pt doesn't want heroic measures/ life support per his wife, have ordered DNR.   8.  DVT proph - no lovenox/ hep due to brain mets  Plan - as above. Hold nonessential po meds for now.      Sol Blazing [facility] Pager [phone]  Cell [phone]  If 7PM-7AM, please contact night-coverage [email or web address] Password [email or web address] Dec 20, 2015, 3:08 AM   [facility], few weeks ago dx'd w metastatic melanoma, walking 3 weeks ago, was living at home, driving , etc., then had stroke- like symtpoms and dx'd with melanomam w brain mets , sent to SNF and here w severe low back pain, plain films negative. Needs MRI   OBS / med surg

## 2015-12-17 NOTE — ED Provider Notes (Signed)
[facility] DEPT Provider Note   CSN: [id number] Arrival date & time: 11/26/2015  [id number]     History   Chief Complaint Chief Complaint  Patient presents with  . Back Pain    HPI AMORE SUKHRAM is a 70 y.o. male.  HPI Complains of low back pain onset this afternoon. Pain not made better or worse by anything... Pain is severe. Patient has been unable to ambulate for the past 3 weeks. No other associated symptoms Past Medical History:  Diagnosis Date  . Arthritis   . Brain metastasis ([place]) 11/24/2015  . CAD in native artery 09/02/2015  . Essential hypertension 09/02/2015  . GERD (gastroesophageal reflux disease)   . Hyperlipidemia 09/02/2015  . Liver lesion   . Lung nodule   . Metastatic melanoma ([place])   . Myocardial infarction [facility])     Patient Active Problem List   Diagnosis Date Noted  . UTI (lower urinary tract infection)   . Altered mental status   . Normocytic anemia 12/08/2015  . Hypokalemia 12/08/2015  . Hyperglycemia 12/07/2015  . Acute encephalopathy 12/07/2015  . Hypocalcemia 12/07/2015  . Hypoalbuminemia 12/07/2015  . Nonspecific abnormal electrocardiogram (ECG) (EKG) 12/07/2015  . Sepsis due to enterococcus ([place])   . Septic shock ([place])   . Acute endocarditis   . Septic embolism ([place])   . Sepsis due to urinary tract infection ([place]) 12/06/2015  . Sepsis ([place]) 12/06/2015  . Lung nodule   . Liver lesion   . Brain tumor ([place])   . Metastatic melanoma ([place])   . Brain mass 11/24/2015  . Melanoma of skin ([place]) 11/24/2015  . Brain metastasis ([place]) 11/24/2015  . Essential hypertension 09/02/2015  . Hyperlipidemia 09/02/2015  . CAD in native artery 09/02/2015    Past Surgical History:  Procedure Laterality Date  . APPENDECTOMY    . HERNIA REPAIR    . RADIOLOGY WITH ANESTHESIA N/A 11/26/2015   Procedure: MRI BRAIN WITH WITH AND WITHOUT CONTRAST;  Surgeon: Medication Radiologist, MD;  Location: [facility];  Service: Radiology;  Laterality: N/A;  . TEE WITHOUT  CARDIOVERSION N/A 12/10/2015   Procedure: TRANSESOPHAGEAL ECHOCARDIOGRAM (TEE);  Surgeon: Thayer Headings, MD;  Location: [facility];  Service: Cardiovascular;  Laterality: N/A;       Home Medications    Prior to Admission medications   Medication Sig Start Date End Date Taking? Authorizing Provider  acetaminophen (TYLENOL) 325 MG tablet Take 650 mg by mouth every 6 (six) hours as needed for moderate pain or headache.    Yes Historical Provider, MD  ampicillin 2 g in sodium chloride 0.9 % 50 mL Inject 2 g into the vein every 4 (four) hours. 12/13/15 12/19/15 Yes Lavina Hamman, MD  aspirin 81 MG chewable tablet Chew 81 mg by mouth daily.   Yes Historical Provider, MD  atorvastatin (LIPITOR) 80 MG tablet Take 80 mg by mouth at bedtime.    Yes Historical Provider, MD  calcium carbonate (OS-CAL) 1250 (500 Ca) MG chewable tablet Chew 1 tablet by mouth 3 (three) times daily.   Yes Historical Provider, MD  dexamethasone (DECADRON) 4 MG tablet Take 4 mg by mouth 4 (four) times daily.   Yes Historical Provider, MD  feeding supplement, ENSURE ENLIVE, (ENSURE ENLIVE) LIQD Take 237 mLs by mouth 3 (three) times daily between meals. 12/05/15  Yes Mariel Aloe, MD  levETIRAcetam (KEPPRA) 750 MG tablet Take 1 tablet (750 mg total) by mouth 2 (two) times daily. 12/05/15  Yes Mariel Aloe, MD  Multiple Vitamin (MULTIVITAMIN WITH MINERALS) TABS tablet Take 1 tablet by mouth daily.   Yes Historical Provider, MD  nitroGLYCERIN (NITROSTAT) 0.4 MG SL tablet Place 0.4 mg under the tongue every 5 (five) minutes as needed for chest pain.   Yes Historical Provider, MD  omeprazole (PRILOSEC) 20 MG capsule Take 20 mg by mouth daily before breakfast.    Yes Historical Provider, MD  oxyCODONE (OXY IR/ROXICODONE) 5 MG immediate release tablet Take 5-10 mg by mouth every 4 (four) hours as needed for moderate pain or severe pain.   Yes Historical Provider, MD  polyethylene glycol (MIRALAX / GLYCOLAX) packet Take 17 g by  mouth daily.   Yes Historical Provider, MD  senna-docusate (SENOKOT-S) 8.6-50 MG tablet Take 1 tablet by mouth daily.   Yes Historical Provider, MD  tamsulosin (FLOMAX) 0.4 MG CAPS capsule Take 1 capsule (0.4 mg total) by mouth daily. 12/13/15  Yes Lavina Hamman, MD    Family History Family History  Problem Relation Age of Onset  . Heart disease Mother   . Hyperlipidemia Mother     Social History Social History  Substance Use Topics  . Smoking status: Never Smoker  . Smokeless tobacco: Never Used  . Alcohol use Not on file     Allergies   Food   Review of Systems Review of Systems   Physical Exam Updated Vital Signs BP 127/86 (BP Location: Left Arm)   Pulse 86   Temp 97.4 F (36.3 C) (Oral)   Resp 17   SpO2 95%   Physical Exam  Genitourinary: Rectum normal and penis normal.  Genitourinary Comments: Rectal normal tone brown stool no gross blood  Musculoskeletal: He exhibits edema.  All 4 extremities without redness swelling or tenderness  Neurological: He is alert.  Moves all extremitities     ED Treatments / Results  Labs (all labs ordered are listed, but only abnormal results are displayed) Labs Reviewed  CBC WITH DIFFERENTIAL/PLATELET - Abnormal; Notable for the following:       Result Value   WBC 24.8 (*)    Platelets 140 (*)    Neutro Abs 23.0 (*)    All other components within normal limits  COMPREHENSIVE METABOLIC PANEL - Abnormal; Notable for the following:    CO2 21 (*)    Glucose, Bld 221 (*)    BUN 34 (*)    Calcium 8.2 (*)    Total Protein 5.4 (*)    Albumin 2.6 (*)    AST 49 (*)    Total Bilirubin 2.0 (*)    All other components within normal limits    EKG  EKG Interpretation None       Radiology No results found.  Procedures Procedures (including critical care time)  Medications Ordered in ED Medications  0.9 %  sodium chloride infusion ( Intravenous New Bag/Given 11/28/2015 2101)  ondansetron (ZOFRAN) injection 4 mg (4  mg Intravenous Given 12/25/2015 2023)  morphine 2 MG/ML injection 2 mg (2 mg Intravenous Given 11/28/2015 2101)     Initial Impression / Assessment and Plan / ED Course  I have reviewed the triage vital signs and the nursing notes.  Pertinent labs & imaging results that were available during my care of the patient were reviewed by me and considered in my medical decision making (see chart for details).  Clinical Course  x-rays viewed by me  Results for orders placed or performed during the hospital encounter of 12/17/15  CBC with Differential/Platelet  Result Value  Ref Range   WBC 24.8 (H) 4.0 - 10.5 K/uL   RBC 4.69 4.22 - 5.81 MIL/uL   Hemoglobin 14.1 13.0 - 17.0 g/dL   HCT 41.8 39.0 - 52.0 %   MCV 89.1 78.0 - 100.0 fL   MCH 30.1 26.0 - 34.0 pg   MCHC 33.7 30.0 - 36.0 g/dL   RDW 14.4 11.5 - 15.5 %   Platelets 140 (L) 150 - 400 K/uL   Neutrophils Relative % 92 %   Neutro Abs 23.0 (H) 1.7 - 7.7 K/uL   Lymphocytes Relative 4 %   Lymphs Abs 0.9 0.7 - 4.0 K/uL   Monocytes Relative 4 %   Monocytes Absolute 0.9 0.1 - 1.0 K/uL   Eosinophils Relative 0 %   Eosinophils Absolute 0.0 0.0 - 0.7 K/uL   Basophils Relative 0 %   Basophils Absolute 0.0 0.0 - 0.1 K/uL  Comprehensive metabolic panel  Result Value Ref Range   Sodium 136 135 - 145 mmol/L   Potassium 4.0 3.5 - 5.1 mmol/L   Chloride 104 101 - 111 mmol/L   CO2 21 (L) 22 - 32 mmol/L   Glucose, Bld 221 (H) 65 - 99 mg/dL   BUN 34 (H) 6 - 20 mg/dL   Creatinine, Ser 0.86 0.61 - 1.24 mg/dL   Calcium 8.2 (L) 8.9 - 10.3 mg/dL   Total Protein 5.4 (L) 6.5 - 8.1 g/dL   Albumin 2.6 (L) 3.5 - 5.0 g/dL   AST 49 (H) 15 - 41 U/L   ALT 57 17 - 63 U/L   Alkaline Phosphatase 83 38 - 126 U/L   Total Bilirubin 2.0 (H) 0.3 - 1.2 mg/dL   GFR calc non Af Amer >60 >60 mL/min   GFR calc Af Amer >60 >60 mL/min   Anion gap 11 5 - 15   Dg Chest 2 View  Result Date: 12/06/2015 CLINICAL DATA:  Fever; possible sepsis; chemo yesterday for brain ca EXAM:  CHEST  2 VIEW COMPARISON:  Chest CT, 11/27/2015 FINDINGS: Left upper lobe mass without significant change from the prior study. Remainder of the lungs is clear. No pleural effusion or pneumothorax. Cardiac silhouette is normal in size. No mediastinal or hilar masses or evidence of adenopathy. Bony thorax is intact. IMPRESSION: No acute cardiopulmonary disease. Electronically Signed   By: Lajean Manes M.D.   On: 12/06/2015 15:07   Dg Lumbar Spine Complete  Result Date: 11/27/2015 CLINICAL DATA:  Acute low back pain. EXAM: LUMBAR SPINE - COMPLETE 4+ VIEW COMPARISON:  CT scan of November 27, 2015. FINDINGS: No fracture or spondylolisthesis is noted. Moderate degenerative disc disease is noted at L4-5 and L5-S1. Atherosclerosis of abdominal aorta is noted. IMPRESSION: Aortic atherosclerosis. Moderate multilevel degenerative disc disease. No acute abnormality seen in the lumbar spine. Electronically Signed   By: Marijo Conception, M.D.   On: 12/22/2015 21:49   Ct Chest W Contrast  Result Date: 11/27/2015 CLINICAL DATA:  Metastatic melanoma. EXAM: CT CHEST, ABDOMEN, AND PELVIS WITH CONTRAST TECHNIQUE: Multidetector CT imaging of the chest, abdomen and pelvis was performed following the standard protocol during bolus administration of intravenous contrast. CONTRAST:  153mL ISOVUE-300 IOPAMIDOL (ISOVUE-300) INJECTION 61% COMPARISON:  None. FINDINGS: CT CHEST FINDINGS Mediastinum/Lymph Nodes: The heart size appears normal. No pericardial effusion. Aortic atherosclerosis noted. LAD coronary artery calcification is identified. The trachea appears patent and is midline. Unremarkable appearance of the esophagus. No enlarged mediastinal, axillary or supraclavicular lymph nodes. Lungs/Pleura: No pleural effusion identified. Pulmonary nodule in  the left upper lobe measures 2.4 cm, image 51 of series 4. Scarring noted within both lower lobes. Musculoskeletal: No chest wall mass or suspicious bone lesions identified. CT ABDOMEN  PELVIS FINDINGS Hepatobiliary: There are 2 low-attenuation foci within the liver. The largest measure 9 mm. These are too small to reliably characterize. The gallbladder appears normal. No biliary dilatation. Pancreas: No mass, inflammatory changes, or other significant abnormality. Spleen: Within normal limits in size and appearance. Adrenals/Urinary Tract: Normal appearance of the adrenal glands. Striated nephrographic appearance of the right kidney is identified, image number 18 of series 8. The urinary bladder appears normal. Stomach/Bowel: Small hiatal hernia. There is no pathologic dilatation of the small bowel loops. Numerous colonic diverticula are identified. No acute inflammation. Vascular/Lymphatic: Calcified atherosclerotic disease involves the abdominal aorta. No aneurysm. No enlarged upper abdominal lymph nodes identified. No pelvic or inguinal adenopathy. Reproductive: Prostate gland enlargement noted. Other: There is no ascites or focal fluid collections within the abdomen or pelvis. Musculoskeletal:  No aggressive lytic or sclerotic bone lesions. IMPRESSION: 1. Pulmonary nodule within the left upper lobe is identified and suspicious for metastases. 2. No specific findings identified to suggest metastatic disease to the abdomen or pelvis. 3. Striated nephrographic appearance of the right kidney. This is a nonspecific finding and may be seen with pyelonephritis as well as vascular abnormality such as embolic phenomenon. Clinical correlation suggested. Electronically Signed   By: Kerby Moors M.D.   On: 11/27/2015 17:46   Mr [name] [id number] Contrast  Result Date: 12/08/2015 CLINICAL DATA:  History of metastatic melanoma with multiple brain lesions. Treated with steroids. Preadmission with possible sepsis 2 days ago. EXAM: MRI HEAD WITHOUT AND WITH CONTRAST TECHNIQUE: Multiplanar, multiecho pulse sequences of the brain and surrounding structures were obtained without and with intravenous contrast.  CONTRAST:  43mL MULTIHANCE GADOBENATE DIMEGLUMINE 529 MG/ML IV SOLN COMPARISON:  11/26/2011 FINDINGS: As demonstrated previously, there are multiple infra and supratentorial brain masses with associated hemorrhage consistent with metastatic melanoma with associated hemorrhage. There has been evolutionary maturation of the associated blood products, but there are no newly seen lesions, larger lesions or new hemorrhage. Mass effect is most prominent in the right frontal lobe with the largest lesion measures 5 cm in diameter and is associated with vasogenic edema. Second largest lesion in the left temporal lobe is also similar, with associated vasogenic edema. Ventricular size is stable. No residual layering blood in the ventricles. No extra-axial collection. No evidence of new ischemic infarction. Major vessels at the base of the brain show flow. IMPRESSION: Multiple hemorrhagic metastatic melanoma lesions, approximately 25 in number. No change in the size, number or mass effect of the lesions. Hemorrhagic components are undergoing expected maturational changes in signal characteristics. Mass effect and edema associated with the 2 largest lesions, in the right frontal brain and in the left temporal brain appear quite similar. Electronically Signed   By: Nelson Chimes M.D.   On: 12/08/2015 18:30   Mr [name] [id number] Contrast  Result Date: 11/26/2015 CLINICAL DATA:  70 year old male with speech difficulty and imbalance. Symptoms started 1 week ago complaining of headache. History melanoma removed 1 month ago. Abnormal imaging at [facility] in [place]. Initial encounter. EXAM: MRI HEAD WITHOUT AND WITH CONTRAST TECHNIQUE: Multiplanar, multiecho pulse sequences of the brain and surrounding structures were obtained without and with intravenous contrast. CONTRAST:  65mL MULTIHANCE GADOBENATE DIMEGLUMINE 529 MG/ML IV SOLN COMPARISON:  No comparison exams available. FINDINGS: Exam was performed under general  anesthesia.  Numerous intracranial masses within the supratentorial and infratentorial region numbering approximately 25 containing blood breakdown products or melanin suggestive of metastatic melanoma given patient's history. Some of the larger lesions include: Right anterior frontal 4.8 x 4.9 x 5 cm mass with marked surrounding vasogenic edema. Cannot exclude extension into the superior right orbital region as bone of the orbital roof may be destroyed. This causes significant mass effect upon the right frontal horn of the lateral ventricular system which is displaced posteriorly and to the right. Left temporal lobe 4.8 x 3.2 x 3.1 cm mass with marked surrounding vasogenic edema and flattening of the left temporal horn. Superior right cerebellar 1.8 cm and 1.4 cm mass with surrounding vasogenic edema and mild compression the right lateral aspect of the fourth ventricle. Blood or proteinaceous material layering within the deep dependent aspect of the lateral ventricles suggesting breakthrough of hemorrhagic metastatic lesion into the ventricular system. Primary intracranial hemorrhage is a secondary less likely consideration. Abnormal appearance of the sulci on FLAIR imaging may be related to oxygen saturation secondary to the fact this was performed under general anesthesia limiting detection of the possibility of subarachnoid hemorrhage. In addition to significant distortion of the lateral ventricle by metastatic disease, mild hydrocephalus may be present. Major intracranial vascular structures are patent. No acute thrombotic infarct. IMPRESSION: Numerous intracranial masses within the supratentorial and infratentorial region numbering approximately 25 containing blood breakdown products or melanin suggestive of metastatic melanoma given patient's history. Some of the larger lesions include: Right anterior frontal 4.8 x 4.9 x 5 cm mass with marked surrounding vasogenic edema. Cannot exclude extension into the  superior right orbital region as bone of the orbital roof may be destroyed. This causes significant mass effect upon the right frontal horn of the lateral ventricular system which is displaced posteriorly and to the right. Left temporal lobe 4.8 x 3.2 x 3.1 cm mass with marked surrounding vasogenic edema and flattening of the left temporal horn. Superior right cerebellar 1.8 cm and 1.4 cm mass with surrounding vasogenic edema and mild compression the right lateral aspect of the fourth ventricle. Blood or proteinaceous material layering within the deep dependent aspect of the lateral ventricles suggesting breakthrough of hemorrhagic metastatic lesion into the ventricular system. Primary intracranial hemorrhage is a secondary less likely consideration. Abnormal appearance of the sulci on FLAIR imaging may be related to oxygen saturation secondary to the fact this was performed under general anesthesia limiting detection of the possibility of subarachnoid hemorrhage. In addition to significant distortion of the lateral ventricle by metastatic disease, mild hydrocephalus may be present. These results will be called to the ordering clinician or representative by the Radiologist Assistant, and communication documented in the PACS or zVision Dashboard. Electronically Signed   By: Genia Del M.D.   On: 11/26/2015 15:32   Ct Abdomen Pelvis W Contrast  Result Date: 11/27/2015 CLINICAL DATA:  Metastatic melanoma. EXAM: CT CHEST, ABDOMEN, AND PELVIS WITH CONTRAST TECHNIQUE: Multidetector CT imaging of the chest, abdomen and pelvis was performed following the standard protocol during bolus administration of intravenous contrast. CONTRAST:  128mL ISOVUE-300 IOPAMIDOL (ISOVUE-300) INJECTION 61% COMPARISON:  None. FINDINGS: CT CHEST FINDINGS Mediastinum/Lymph Nodes: The heart size appears normal. No pericardial effusion. Aortic atherosclerosis noted. LAD coronary artery calcification is identified. The trachea appears patent  and is midline. Unremarkable appearance of the esophagus. No enlarged mediastinal, axillary or supraclavicular lymph nodes. Lungs/Pleura: No pleural effusion identified. Pulmonary nodule in the left upper lobe measures 2.4 cm, image 51 of series  4. Scarring noted within both lower lobes. Musculoskeletal: No chest wall mass or suspicious bone lesions identified. CT ABDOMEN PELVIS FINDINGS Hepatobiliary: There are 2 low-attenuation foci within the liver. The largest measure 9 mm. These are too small to reliably characterize. The gallbladder appears normal. No biliary dilatation. Pancreas: No mass, inflammatory changes, or other significant abnormality. Spleen: Within normal limits in size and appearance. Adrenals/Urinary Tract: Normal appearance of the adrenal glands. Striated nephrographic appearance of the right kidney is identified, image number 18 of series 8. The urinary bladder appears normal. Stomach/Bowel: Small hiatal hernia. There is no pathologic dilatation of the small bowel loops. Numerous colonic diverticula are identified. No acute inflammation. Vascular/Lymphatic: Calcified atherosclerotic disease involves the abdominal aorta. No aneurysm. No enlarged upper abdominal lymph nodes identified. No pelvic or inguinal adenopathy. Reproductive: Prostate gland enlargement noted. Other: There is no ascites or focal fluid collections within the abdomen or pelvis. Musculoskeletal:  No aggressive lytic or sclerotic bone lesions. IMPRESSION: 1. Pulmonary nodule within the left upper lobe is identified and suspicious for metastases. 2. No specific findings identified to suggest metastatic disease to the abdomen or pelvis. 3. Striated nephrographic appearance of the right kidney. This is a nonspecific finding and may be seen with pyelonephritis as well as vascular abnormality such as embolic phenomenon. Clinical correlation suggested. Electronically Signed   By: Kerby Moors M.D.   On: 11/27/2015 17:46   Mr Liver  W [id number] Contrast  Result Date: 12/03/2015 CLINICAL DATA:  Metastatic melanoma.  Evaluate liver lesion. EXAM: MRI ABDOMEN WITHOUT AND WITH CONTRAST TECHNIQUE: Multiplanar multisequence MR imaging of the abdomen was performed both before and after the administration of intravenous contrast. CONTRAST:  16 cc MultiHance COMPARISON:  CT 11/27/2015 FINDINGS: Moderate motion degradation throughout. The initial series, including the TT respiratory triggered axials, are of moderate to good quality. The later series, including the pre and postcontrast dynamic, are severely motion degraded and essentially nondiagnostic. Lower chest: Normal heart size without pericardial or pleural effusion. Incidental note is made of ascending aortic dilatation, including at 4.5 cm on image 2/series 1802. Hepatobiliary: The 2 left hepatic lobe lesions are markedly T2 hyperintense on image 16/ series 3 and demonstrate no significant post-contrast enhancement. Most consistent with simple cysts. Larger is in the lateral segment left liver lobe at 8 mm. No suspicious liver lesion. Normal gallbladder, without biliary ductal dilatation. Pancreas: Normal, without mass or ductal dilatation. Spleen: Normal in size, without focal abnormality. Adrenals/Urinary Tract: Normal adrenal glands. Tiny bilateral renal lesions which are likely cysts. Heterogeneous T2 signal within the interpolar right kidney, including wedge-shaped area of T2 hypo intensity on image 28/ series 3. Concurrent hypo enhancement. No hydronephrosis. Stomach/Bowel: Small hiatal hernia. Otherwise normal stomach and abdominal bowel loops. Vascular/Lymphatic: Normal caliber of the aorta and branch vessels. Retroaortic left renal vein. Right renal vein grossly patent. No retroperitoneal or retrocrural adenopathy. Other: No ascites. Musculoskeletal: No acute osseous abnormality. IMPRESSION: 1. Moderate motion degradation. 2. The hepatic lesions are consistent with cysts. No evidence of hepatic  metastasis. 3. Right renal abnormality is favored to represent pyelonephritis. Correlate with urinalysis. 4. Ascending aortic aneurysm, on the order of 4.5 cm. Recommend semi-annual imaging followup by CTA or MRA and referral to cardiothoracic surgery if not already obtained. This recommendation follows 2010 ACCF/AHA/AATS/ACR/ASA/SCA/SCAI/SIR/STS/SVM Guidelines for the Diagnosis and Management of Patients With Thoracic Aortic Disease. Circulation. 2010; [id number] Electronically Signed   By: Abigail Miyamoto M.D.   On: 12/03/2015 08:25   Dr. Jonnie Finner consulted .  Concern for inability to ambulate in light metastatic disease and back pain. Patient needs MRI of spine. Plan 23 hour observation med surg floor . Palliative care consult. For now patient is full code Final Clinical Impressions(s) / ED Diagnoses   Final diagnoses:  None   Dx#1 back pain #2 leukiocytosis #3 ataxia New Prescriptions New Prescriptions   No medications on file     Orlie Dakin, MD 12/16/2015 2259

## 2015-12-18 DIAGNOSIS — K219 Gastro-esophageal reflux disease without esophagitis: Secondary | ICD-10-CM | POA: Diagnosis present

## 2015-12-18 DIAGNOSIS — M549 Dorsalgia, unspecified: Secondary | ICD-10-CM

## 2015-12-18 DIAGNOSIS — Z8679 Personal history of other diseases of the circulatory system: Secondary | ICD-10-CM | POA: Diagnosis not present

## 2015-12-18 DIAGNOSIS — R400 Somnolence: Secondary | ICD-10-CM

## 2015-12-18 DIAGNOSIS — N179 Acute kidney failure, unspecified: Secondary | ICD-10-CM | POA: Diagnosis present

## 2015-12-18 DIAGNOSIS — E869 Volume depletion, unspecified: Secondary | ICD-10-CM | POA: Diagnosis present

## 2015-12-18 DIAGNOSIS — I959 Hypotension, unspecified: Secondary | ICD-10-CM | POA: Diagnosis not present

## 2015-12-18 DIAGNOSIS — Z91018 Allergy to other foods: Secondary | ICD-10-CM | POA: Diagnosis not present

## 2015-12-18 DIAGNOSIS — I10 Essential (primary) hypertension: Secondary | ICD-10-CM | POA: Diagnosis present

## 2015-12-18 DIAGNOSIS — C7931 Secondary malignant neoplasm of brain: Secondary | ICD-10-CM | POA: Diagnosis present

## 2015-12-18 DIAGNOSIS — Z515 Encounter for palliative care: Secondary | ICD-10-CM | POA: Diagnosis not present

## 2015-12-18 DIAGNOSIS — C799 Secondary malignant neoplasm of unspecified site: Secondary | ICD-10-CM

## 2015-12-18 DIAGNOSIS — M545 Low back pain, unspecified: Secondary | ICD-10-CM

## 2015-12-18 DIAGNOSIS — I2510 Atherosclerotic heart disease of native coronary artery without angina pectoris: Secondary | ICD-10-CM | POA: Diagnosis present

## 2015-12-18 DIAGNOSIS — I252 Old myocardial infarction: Secondary | ICD-10-CM | POA: Diagnosis not present

## 2015-12-18 DIAGNOSIS — Z87440 Personal history of urinary (tract) infections: Secondary | ICD-10-CM | POA: Diagnosis not present

## 2015-12-18 DIAGNOSIS — C787 Secondary malignant neoplasm of liver and intrahepatic bile duct: Secondary | ICD-10-CM | POA: Diagnosis present

## 2015-12-18 DIAGNOSIS — A4181 Sepsis due to Enterococcus: Secondary | ICD-10-CM | POA: Diagnosis present

## 2015-12-18 DIAGNOSIS — Z66 Do not resuscitate: Secondary | ICD-10-CM | POA: Diagnosis present

## 2015-12-18 DIAGNOSIS — E785 Hyperlipidemia, unspecified: Secondary | ICD-10-CM | POA: Diagnosis present

## 2015-12-18 DIAGNOSIS — Z7189 Other specified counseling: Secondary | ICD-10-CM

## 2015-12-18 DIAGNOSIS — D72829 Elevated white blood cell count, unspecified: Secondary | ICD-10-CM | POA: Diagnosis present

## 2015-12-18 DIAGNOSIS — C7800 Secondary malignant neoplasm of unspecified lung: Secondary | ICD-10-CM | POA: Diagnosis present

## 2015-12-18 DIAGNOSIS — Z8249 Family history of ischemic heart disease and other diseases of the circulatory system: Secondary | ICD-10-CM | POA: Diagnosis not present

## 2015-12-18 DIAGNOSIS — C4359 Malignant melanoma of other part of trunk: Secondary | ICD-10-CM | POA: Diagnosis present

## 2015-12-18 DIAGNOSIS — Z79899 Other long term (current) drug therapy: Secondary | ICD-10-CM | POA: Diagnosis not present

## 2015-12-18 DIAGNOSIS — Z7982 Long term (current) use of aspirin: Secondary | ICD-10-CM | POA: Diagnosis not present

## 2015-12-18 DIAGNOSIS — Z7952 Long term (current) use of systemic steroids: Secondary | ICD-10-CM | POA: Diagnosis not present

## 2015-12-18 LAB — URINALYSIS, ROUTINE W REFLEX MICROSCOPIC
GLUCOSE, UA: 250 mg/dL — AB
KETONES UR: NEGATIVE mg/dL
NITRITE: POSITIVE — AB
PROTEIN: 100 mg/dL — AB
Specific Gravity, Urine: 1.044 — ABNORMAL HIGH (ref 1.005–1.030)
pH: 5.5 (ref 5.0–8.0)

## 2015-12-18 LAB — BASIC METABOLIC PANEL
Anion gap: 7 (ref 5–15)
BUN: 40 mg/dL — AB (ref 6–20)
CO2: 17 mmol/L — ABNORMAL LOW (ref 22–32)
Calcium: 7.3 mg/dL — ABNORMAL LOW (ref 8.9–10.3)
Chloride: 116 mmol/L — ABNORMAL HIGH (ref 101–111)
Creatinine, Ser: 1.36 mg/dL — ABNORMAL HIGH (ref 0.61–1.24)
GFR calc Af Amer: 60 mL/min — ABNORMAL LOW (ref >60)
GFR, EST NON AFRICAN AMERICAN: 52 mL/min — AB (ref >60)
GLUCOSE: 186 mg/dL — AB (ref 65–99)
POTASSIUM: 4.1 mmol/L (ref 3.5–5.1)
Sodium: 140 mmol/L (ref 135–145)

## 2015-12-18 LAB — MRSA PCR SCREENING: MRSA BY PCR: NEGATIVE

## 2015-12-18 LAB — URINE MICROSCOPIC-ADD ON

## 2015-12-18 LAB — GLUCOSE, CAPILLARY: Glucose-Capillary: 206 mg/dL — ABNORMAL HIGH (ref 65–99)

## 2015-12-18 LAB — CBC
HCT: 43.2 % (ref 39.0–52.0)
Hemoglobin: 14.8 g/dL (ref 13.0–17.0)
MCH: 30.6 pg (ref 26.0–34.0)
MCHC: 34.3 g/dL (ref 30.0–36.0)
MCV: 89.4 fL (ref 78.0–100.0)
PLATELETS: 92 10*3/uL — AB (ref 150–400)
RBC: 4.83 MIL/uL (ref 4.22–5.81)
RDW: 14.9 % (ref 11.5–15.5)
WBC: 27.8 10*3/uL — ABNORMAL HIGH (ref 4.0–10.5)

## 2015-12-18 MED ORDER — SODIUM CHLORIDE 0.9 % IV SOLN
0.5000 mg/h | INTRAVENOUS | Status: DC
  Administered 2015-12-18: 0.5 mg/h via INTRAVENOUS
  Filled 2015-12-18: qty 5

## 2015-12-18 MED ORDER — POLYVINYL ALCOHOL 1.4 % OP SOLN
1.0000 [drp] | Freq: Four times a day (QID) | OPHTHALMIC | Status: DC | PRN
  Filled 2015-12-18: qty 15

## 2015-12-18 MED ORDER — HYDROMORPHONE BOLUS VIA INFUSION
0.5000 mg | INTRAVENOUS | Status: DC | PRN
  Administered 2015-12-18: 0.5 mg via INTRAVENOUS
  Filled 2015-12-18: qty 1

## 2015-12-18 MED ORDER — POLYETHYLENE GLYCOL 3350 17 G PO PACK: 17.0000 g | PACK | Freq: Every day | ORAL | Status: DC | PRN

## 2015-12-18 MED ORDER — LORAZEPAM 1 MG PO TABS: 1.0000 mg | ORAL_TABLET | ORAL | Status: DC | PRN

## 2015-12-18 MED ORDER — POLYETHYLENE GLYCOL 3350 17 G PO PACK: 17.0000 g | PACK | Freq: Every day | ORAL | Status: DC

## 2015-12-18 MED ORDER — ONDANSETRON HCL 4 MG PO TABS: 4.0000 mg | ORAL_TABLET | Freq: Four times a day (QID) | ORAL | Status: DC | PRN

## 2015-12-18 MED ORDER — LORAZEPAM 2 MG/ML IJ SOLN: 1.0000 mg | INTRAMUSCULAR | Status: DC | PRN

## 2015-12-18 MED ORDER — ACETAMINOPHEN 325 MG PO TABS: 650.0000 mg | ORAL_TABLET | Freq: Four times a day (QID) | ORAL | Status: DC | PRN

## 2015-12-18 MED ORDER — SODIUM CHLORIDE 0.9 % IV SOLN
750.0000 mg | Freq: Two times a day (BID) | INTRAVENOUS | Status: DC
  Filled 2015-12-18 (×2): qty 7.5

## 2015-12-18 MED ORDER — SODIUM CHLORIDE 0.9 % IV SOLN
2.0000 g | INTRAVENOUS | Status: DC
  Administered 2015-12-18 (×2): 2 g via INTRAVENOUS
  Filled 2015-12-18 (×5): qty 2000

## 2015-12-18 MED ORDER — HYDROCORTISONE NA SUCCINATE PF 100 MG IJ SOLR
100.0000 mg | Freq: Three times a day (TID) | INTRAMUSCULAR | Status: DC
  Administered 2015-12-18: 100 mg via INTRAVENOUS
  Filled 2015-12-18 (×2): qty 2

## 2015-12-18 MED ORDER — HYDROCORTISONE NA SUCCINATE PF 100 MG IJ SOLR
100.0000 mg | Freq: Three times a day (TID) | INTRAMUSCULAR | Status: DC
  Filled 2015-12-18: qty 2

## 2015-12-18 MED ORDER — MORPHINE SULFATE (PF) 4 MG/ML IV SOLN
4.0000 mg | Freq: Once | INTRAVENOUS | Status: AC
  Administered 2015-12-18: 4 mg via INTRAVENOUS
  Filled 2015-12-18: qty 1

## 2015-12-18 MED ORDER — TAMSULOSIN HCL 0.4 MG PO CAPS: 0.4000 mg | ORAL_CAPSULE | Freq: Every day | ORAL | Status: DC

## 2015-12-18 MED ORDER — GLYCOPYRROLATE 0.2 MG/ML IJ SOLN
0.2000 mg | INTRAMUSCULAR | Status: DC | PRN
  Filled 2015-12-18: qty 1

## 2015-12-18 MED ORDER — SODIUM CHLORIDE 0.9 % IV BOLUS (SEPSIS)
2000.0000 mL | Freq: Once | INTRAVENOUS | Status: AC
  Administered 2015-12-18: 2000 mL via INTRAVENOUS

## 2015-12-18 MED ORDER — GLYCOPYRROLATE 1 MG PO TABS
1.0000 mg | ORAL_TABLET | ORAL | Status: DC | PRN
  Filled 2015-12-18: qty 1

## 2015-12-18 MED ORDER — SODIUM CHLORIDE 0.9 % IV BOLUS (SEPSIS): 500.0000 mL | Freq: Once | INTRAVENOUS | Status: DC

## 2015-12-18 MED ORDER — CEFTRIAXONE SODIUM 1 G IJ SOLR
1.0000 g | Freq: Every day | INTRAMUSCULAR | Status: DC
  Administered 2015-12-18: 1 g via INTRAVENOUS
  Filled 2015-12-18: qty 10

## 2015-12-18 MED ORDER — MORPHINE SULFATE (PF) 2 MG/ML IV SOLN
2.0000 mg | Freq: Once | INTRAVENOUS | Status: AC
  Administered 2015-12-18: 2 mg via INTRAVENOUS
  Filled 2015-12-18: qty 1

## 2015-12-18 MED ORDER — SODIUM CHLORIDE 0.9% FLUSH: 3.0000 mL | Freq: Two times a day (BID) | INTRAVENOUS | Status: DC

## 2015-12-18 MED ORDER — MORPHINE SULFATE (PF) 2 MG/ML IV SOLN
2.0000 mg | INTRAVENOUS | Status: DC | PRN
  Administered 2015-12-18 (×2): 2 mg via INTRAVENOUS
  Filled 2015-12-18 (×2): qty 1

## 2015-12-18 MED ORDER — HYDROCORTISONE NA SUCCINATE PF 100 MG IJ SOLR: 50.0000 mg | Freq: Three times a day (TID) | INTRAMUSCULAR | Status: DC

## 2015-12-18 MED ORDER — LEVETIRACETAM 250 MG PO TABS
750.0000 mg | ORAL_TABLET | Freq: Two times a day (BID) | ORAL | Status: DC
  Filled 2015-12-18: qty 3

## 2015-12-18 MED ORDER — SODIUM CHLORIDE 0.9% FLUSH: 10.0000 mL | INTRAVENOUS | Status: DC | PRN

## 2015-12-18 MED ORDER — LORAZEPAM 2 MG/ML PO CONC: 1.0000 mg | ORAL | Status: DC | PRN

## 2015-12-18 MED ORDER — ONDANSETRON HCL 4 MG/2ML IJ SOLN
4.0000 mg | Freq: Four times a day (QID) | INTRAMUSCULAR | Status: DC | PRN
  Administered 2015-12-18: 4 mg via INTRAVENOUS
  Filled 2015-12-18: qty 2

## 2015-12-18 MED ORDER — ADULT MULTIVITAMIN W/MINERALS CH: 1.0000 | ORAL_TABLET | Freq: Every day | ORAL | Status: DC

## 2015-12-18 MED ORDER — SODIUM CHLORIDE 0.9 % IV SOLN
INTRAVENOUS | Status: DC
  Administered 2015-12-18: 05:00:00 via INTRAVENOUS

## 2015-12-19 DIAGNOSIS — Z7189 Other specified counseling: Secondary | ICD-10-CM

## 2015-12-19 DIAGNOSIS — Z515 Encounter for palliative care: Secondary | ICD-10-CM

## 2015-12-19 LAB — URINE CULTURE: Culture: NO GROWTH

## 2015-12-27 ENCOUNTER — Ambulatory Visit: Payer: Medicare Other | Admitting: Oncology

## 2015-12-27 NOTE — Discharge Summary (Signed)
Physician Discharge Summary  Chad Ball [id number] DOB: Sep 23, 1945 DOA: 12/05/2015  PCP: Chad Lopes, MD  Admit date: 12/17/2015 Discharge date: 13-Jan-2016  Death discharge summary:   Diagnoses:  Metastatic Melanoma. CAD in native artery Brain metastasis (HCC) Sepsis due to enterococcus (HCC) Altered mental status Volume depletion Leukocytosis Midline low back pain without sciatica Acute Kidney Injury  History of present illness/Hospital course: Chad Ball was a 70 year old male with history of metastatic melanoma. Patient presented to the Emergency Department with low back pain that started on the day of admission.  He had baseline confusion due to brain mets.  No history of recent fall or trauma.  Plain film xrays showed some DJD, nothing acute. MRI of the spine was planned to rule out spinal pathology as patient could not move the right lower extremity as well. Patient had significant leukocytosis on admission.  Significantly, patient was admitted from August 11 to 18, 2017 due to sepsis, and the blood cultures grew enterococcus.  Overnight, the patient continued to deteriorate, with significant hypotension noted. Palliative care team discussed with patient's family and comfort directed measures were elected. Patient passed on shortly afterwards.    Signed:  Dana Allan, MD  [facility] Pager #: [phone] 7PM-7AM contact night coverage as above

## 2015-12-27 NOTE — Progress Notes (Signed)
Rapid Response RN notified for low BP.  MD notified. Pt lethargic but responds to questions throughout RR call. Orders placed. Appreciate RR RNs assistance.

## 2015-12-27 NOTE — Progress Notes (Signed)
Patient passed at 11:25am. Wasted 49mg  diluadid from IVPB with a total of 29ml fluid in bag. Waste witnessed by [employer], [place].

## 2015-12-27 NOTE — Progress Notes (Signed)
Family at bedside. RR RN back to bedside as patient went into Vfib. RR RN and this RN confirmed DNR code status with family and patient at bedside. MD updated, morphine administered for pain.

## 2015-12-27 NOTE — Consult Note (Signed)
Consultation Note Date: Jan 04, 2016   Patient Name: Chad Ball  DOB: May 07, 1945  MRN: [id number]  Age / Sex: 70 y.o., male  PCP: Leanna Battles, MD Referring Physician: Bonnell Public, MD  Reason for Consultation: Establishing goals of care  HPI/Patient Profile: 70 y.o. male  with past medical history of metastatic melanoma, aortic aneurysm, hyperlipidemia who was admitted on 11/26/2015 with back pain and leg weakness.  Chad Ball was diagnosed with melanoma in June.  He had the lesion removed from his chest in July.  Unfortunately his cancer has spread quickly.  He has possible lung and liver mets and he was found to have approximately 25 brain lesions on his 12/08/15 MR.  He was admitted to the hospital 8/11 - 8/18 with enterococcus bacteremia and was treated with antibiotics.  His Chad and dtr feel he was released too soon to SNF.  He returns on 8/22 with back pain and weakness.  His BP is currently very soft (60/20 despite solucortef), he is lethargic and in pain.  Clinical Assessment and Goals of Care: I met the patient initially at bedside with multiple family members.  His Chad/son/daughter and I then walked down the hall to have a more detailed conversation.  Chad Ball was just diagnosed in June.  He was highly functioning until the end of July when he was hospitalized in [place] and found to have a brain mets.  Since that time he has been struck by weakness and had a very difficult time recognizing his family.  Chad Ball understands her husband is at end of life, but does not want to let him go.  His children are still struggling with questions (Would antibiotics help? Would a blood transfusion help? Etc...)  The family agrees that they do not want him to suffer.   They requested I start a drip for pain medication.  We discussed a prognosis of hours to days.  The family understands that  with the addition of a drip for pain medication he is likely to go to sleep and not wake.  HCPOA:  Chad Ball    SUMMARY OF RECOMMENDATIONS    Code Status/Advance Care Planning:  DNR    Symptom Management:   Low dose dilaudid GTT for pain.  Continue Solucortef for bone pain as well  Continue antibiotics for now.   The family will likely agree to de-escalate antibiotics and steroids in the next day or so  Change keppra to IV - patient appears to lethargic to safely take PO medications.  Will discontinue multivitamin and other PO meds.   Palliative Prophylaxis:   Delirium Protocol  Additional Recommendations (Limitations, Scope, Preferences):  Trending towards full comfort.  Anticipate we will go full comfort in the next day or so.  Psycho-social/Spiritual:   Desire for further Chaplaincy support:yes  Additional Recommendations: Caregiving  Support/Resources  Prognosis:   Hours - Days  Discharge Planning: Anticipated Hospital Death      Primary Diagnoses: Present on Admission: . Back pain . CAD in native artery .  Brain metastasis ([place]) . Metastatic melanoma ([place]) . Sepsis due to enterococcus ([place]) . Altered mental status . Volume depletion . Leukocytosis   I have reviewed the medical record, interviewed the patient and family, and examined the patient. The following aspects are pertinent.  Past Medical History:  Diagnosis Date  . Arthritis   . Brain metastasis ([place]) 11/24/2015  . CAD in native artery 09/02/2015  . Essential hypertension 09/02/2015  . GERD (gastroesophageal reflux disease)   . Hyperlipidemia 09/02/2015  . Liver lesion   . Lung nodule   . Metastatic melanoma ([place])   . Myocardial infarction [facility])    Social History   Social History  . Marital status: Married    Spouse name: Chad Ball  . Number of children: 2  . Years of education: N/A   Social History Main Topics  . Smoking status: Never Smoker  . Smokeless tobacco:  Never Used  . Alcohol use None  . Drug use: Unknown  . Sexual activity: No   Other Topics Concern  . None   Social History Narrative  . None   Family History  Problem Relation Age of Onset  . Heart disease Mother   . Hyperlipidemia Mother    Scheduled Meds: . ampicillin IVPB 2 gram/NS 50 mL  2 g Intravenous Q4H  . cefTRIAXone (ROCEPHIN)  IV  1 g Intravenous QAC breakfast  . hydrocortisone sod succinate (SOLU-CORTEF) inj  100 mg Intravenous Q8H  . levETIRAcetam  750 mg Intravenous Q12H  . sodium chloride  500 mL Intravenous Once  . sodium chloride flush  3 mL Intravenous Q12H   Continuous Infusions: . sodium chloride 150 mL/hr at 12/26/2015 0453  . HYDROmorphone     PRN Meds:.acetaminophen, glycopyrrolate **OR** glycopyrrolate **OR** glycopyrrolate, HYDROmorphone, LORazepam **OR** LORazepam **OR** LORazepam, morphine injection, ondansetron **OR** ondansetron (ZOFRAN) IV, polyethylene glycol, polyvinyl alcohol, sodium chloride flush Medications Prior to Admission:  Prior to Admission medications   Medication Sig Start Date End Date Taking? Authorizing Provider  acetaminophen (TYLENOL) 325 MG tablet Take 650 mg by mouth every 6 (six) hours as needed for moderate pain or headache.    Yes Historical Provider, MD  ampicillin 2 g in sodium chloride 0.9 % 50 mL Inject 2 g into the vein every 4 (four) hours. 12/13/15 12/19/15 Yes Lavina Hamman, MD  aspirin 81 MG chewable tablet Chew 81 mg by mouth daily.   Yes Historical Provider, MD  atorvastatin (LIPITOR) 80 MG tablet Take 80 mg by mouth at bedtime.    Yes Historical Provider, MD  calcium carbonate (OS-CAL) 1250 (500 Ca) MG chewable tablet Chew 1 tablet by mouth 3 (three) times daily.   Yes Historical Provider, MD  dexamethasone (DECADRON) 4 MG tablet Take 4 mg by mouth 4 (four) times daily.   Yes Historical Provider, MD  feeding supplement, ENSURE ENLIVE, (ENSURE ENLIVE) LIQD Take 237 mLs by mouth 3 (three) times daily between meals.  12/05/15  Yes Mariel Aloe, MD  levETIRAcetam (KEPPRA) 750 MG tablet Take 1 tablet (750 mg total) by mouth 2 (two) times daily. 12/05/15  Yes Mariel Aloe, MD  Multiple Vitamin (MULTIVITAMIN WITH MINERALS) TABS tablet Take 1 tablet by mouth daily.   Yes Historical Provider, MD  nitroGLYCERIN (NITROSTAT) 0.4 MG SL tablet Place 0.4 mg under the tongue every 5 (five) minutes as needed for chest pain.   Yes Historical Provider, MD  omeprazole (PRILOSEC) 20 MG capsule Take 20 mg by mouth daily before breakfast.  Yes Historical Provider, MD  oxyCODONE (OXY IR/ROXICODONE) 5 MG immediate release tablet Take 5-10 mg by mouth every 4 (four) hours as needed for moderate pain or severe pain.   Yes Historical Provider, MD  polyethylene glycol (MIRALAX / GLYCOLAX) packet Take 17 g by mouth daily.   Yes Historical Provider, MD  senna-docusate (SENOKOT-S) 8.6-50 MG tablet Take 1 tablet by mouth daily.   Yes Historical Provider, MD  tamsulosin (FLOMAX) 0.4 MG CAPS capsule Take 1 capsule (0.4 mg total) by mouth daily. 12/13/15  Yes Lavina Hamman, MD   Allergies  Allergen Reactions  . Food Anaphylaxis and Other (See Comments)    Pt is allergic to [place] nuts.    Review of Systems  Physical Exam  Vital Signs: BP (!) 60/20 (BP Location: Left Arm) Comment: nurse notified  Pulse (!) 58   Temp 98.4 F (36.9 C) (Axillary)   Resp (!) 22   SpO2 (!) 78% Comment: nurse notified Pain Assessment: PAINAD   Pain Score: 6    SpO2: SpO2: (!) 78 % (nurse notified) O2 Device:SpO2: (!) 78 % (nurse notified) O2 Flow Rate: .   IO: Intake/output summary: No intake or output data in the 24 hours ending December 26, 2015 1022  LBM:   Baseline Weight:   Most recent weight:       Palliative Assessment/Data:   Flowsheet Rows   Flowsheet Row Most Recent Value  Intake Tab  Referral Department  Hospitalist  Unit at Time of Referral  Cardiac/Telemetry Unit  Palliative Care Primary Diagnosis  Cancer  Date Notified  2015/12/26   Palliative Care Type  New Palliative care  Reason for referral  Clarify Goals of Care  Date of Admission  11/30/2015  Date first seen by Palliative Care  12-26-2015  # of days Palliative referral response time  0 Day(s)  # of days IP prior to Palliative referral  1  Clinical Assessment  Palliative Performance Scale Score  20%  Pain Max last 24 hours  8  Psychosocial & Spiritual Assessment  Palliative Care Outcomes  Patient/Family meeting held?  Yes  Who was at the meeting?  pt, Chad, dtr, son, multiple family members  Palliative Care Outcomes  Improved pain interventions, Improved non-pain symptom therapy, Clarified goals of care, Provided end of life care assistance      Time In: 9:00 Time Out: 10:18 Time Total: 78 min. Greater than 50%  of this time was spent counseling and coordinating care related to the above assessment and plan.  Signed by: Imogene Burn, PA-C Palliative Medicine Pager: [phone]   Please contact Palliative Medicine Team phone at [phone] for questions and concerns.  For individual provider: See Shea Evans

## 2015-12-27 NOTE — Progress Notes (Signed)
   Jan 10, 2016 1000  Clinical Encounter Type  Visited With Family  Visit Type Initial;Psychological support;Spiritual support;Patient actively dying  Referral From Nurse  Consult/Referral To Chaplain  Spiritual Encounters  Spiritual Needs Emotional;Prayer;Grief support;Other (Comment) (Pastoral Conversation/Support)  Stress Factors  Patient Stress Factors Not reviewed  Family Stress Factors Loss;Major life changes   I visited with the patient's family per referral by the nurse who stated that the patient was not doing well and that the family could use pastoral care.  The wife was at the bedside and very tearful. Other family members were in the room and visibly upset.  The patient's wife stated that she didn't expect the patient's condition to get so bad that quickly. She had planned on being able to take the patient home.  The family requested prayer and we prayed around the bedside.  The family are being support for one another.   Please, contact Spiritual Care for further assistance.   Edgar M.Div.

## 2015-12-27 DEATH — deceased

## 2015-12-30 NOTE — Progress Notes (Signed)
  Radiation Oncology         [phone] ________________________________  Name: Chad Ball MRN: [id number]  Date: 12/16/2015  DOB: 22-Mar-1946  End of Treatment Note  Diagnosis:   Brain metastasis     Indication for treatment::  palliative       Radiation treatment dates:   12/02/2015 through 11/26/2015  Site/dose:   The patient was treated with a course of whole brain radiation treatment to a dose of 30 gray in 10 fractions at 3 gray per fraction  Narrative: The patient tolerated radiation treatment.   He was able to complete his prescribed course of treatment without substantial delay. The patient was admitted for sepsis during the course of his treatment but he was able to continue.  Plan: The patient has completed radiation treatment. The patient will return to radiation oncology clinic for routine followup in one month. I advised the patient to call or return sooner if they have any questions or concerns related to their recovery or treatment. ________________________________  Jodelle Gross, M.D., Ph.D.

## 2016-01-17 ENCOUNTER — Encounter: Payer: Self-pay | Admitting: Radiation Oncology

## 2016-01-22 ENCOUNTER — Ambulatory Visit: Payer: Self-pay | Admitting: Radiation Oncology

## 2016-11-24 IMAGING — CR DG CHEST 2V
2 series · 2 of 2 positions shown · non-contrast
Comparison: Chest CT, 11/27/2015

CLINICAL DATA: Fever; possible sepsis; chemo yesterday for brain ca

EXAM:
CHEST  2 VIEW

[x chest ap]
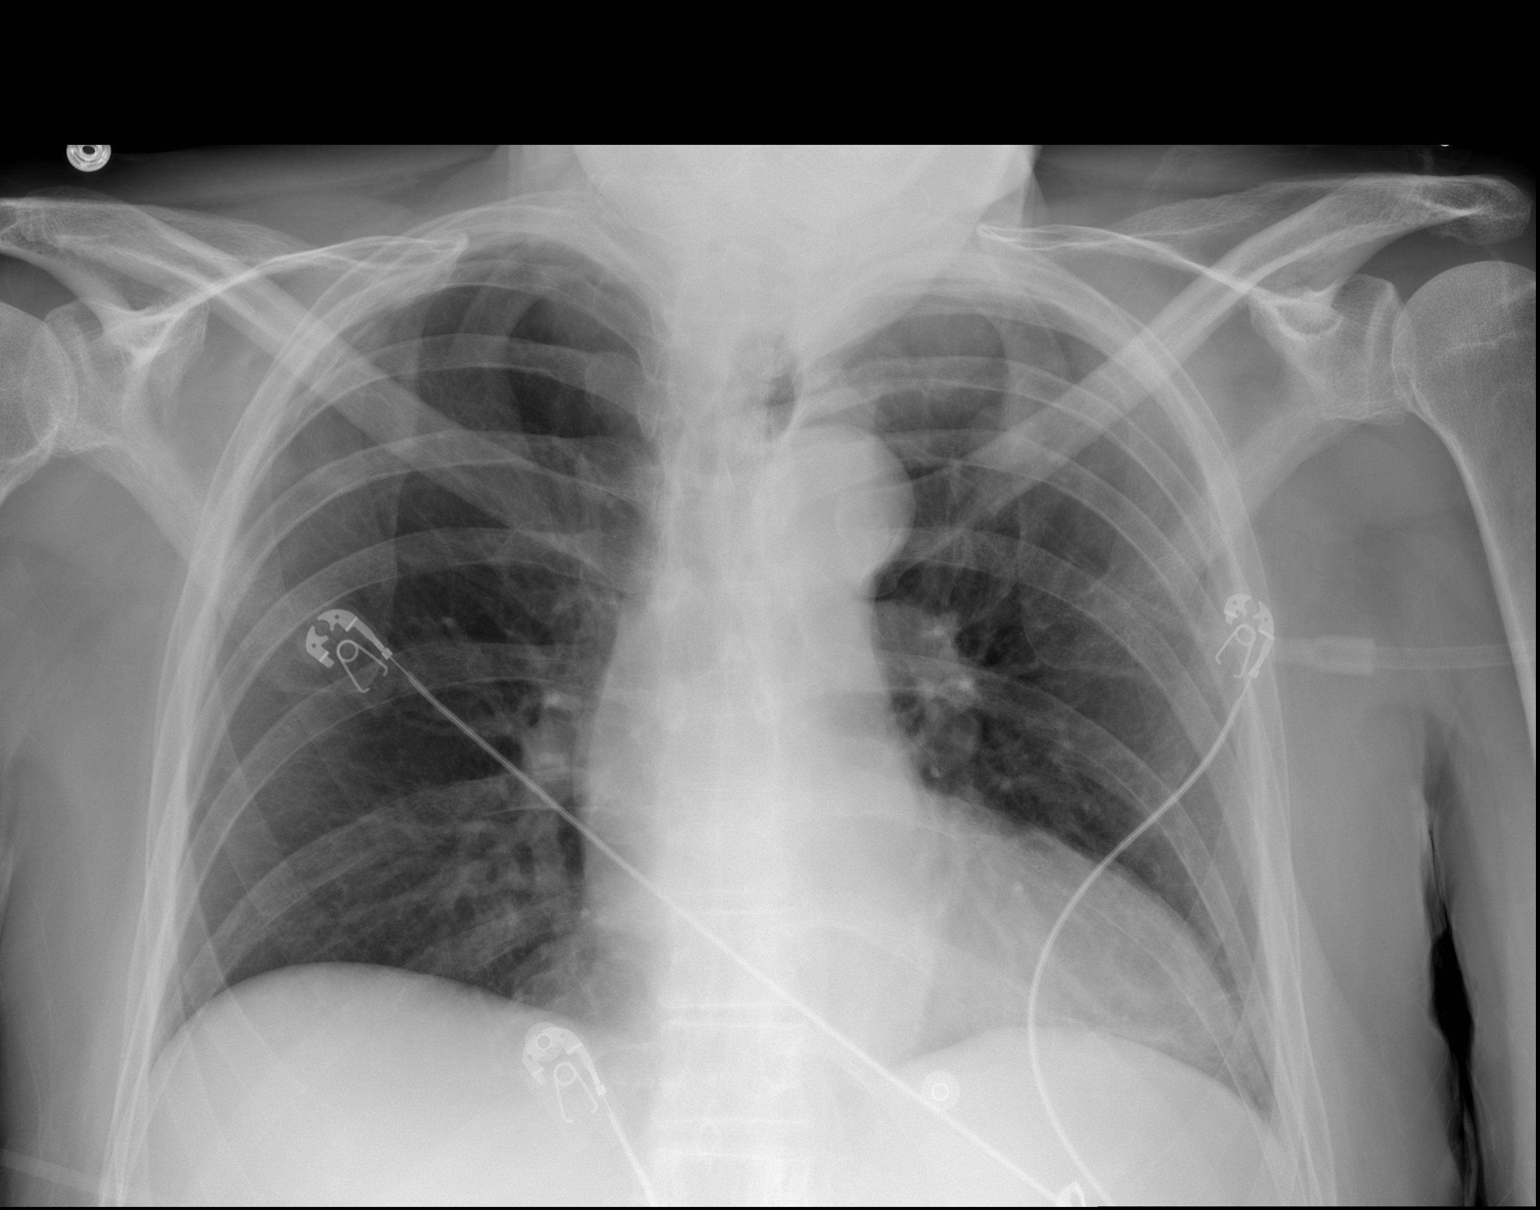

[w chest lat]
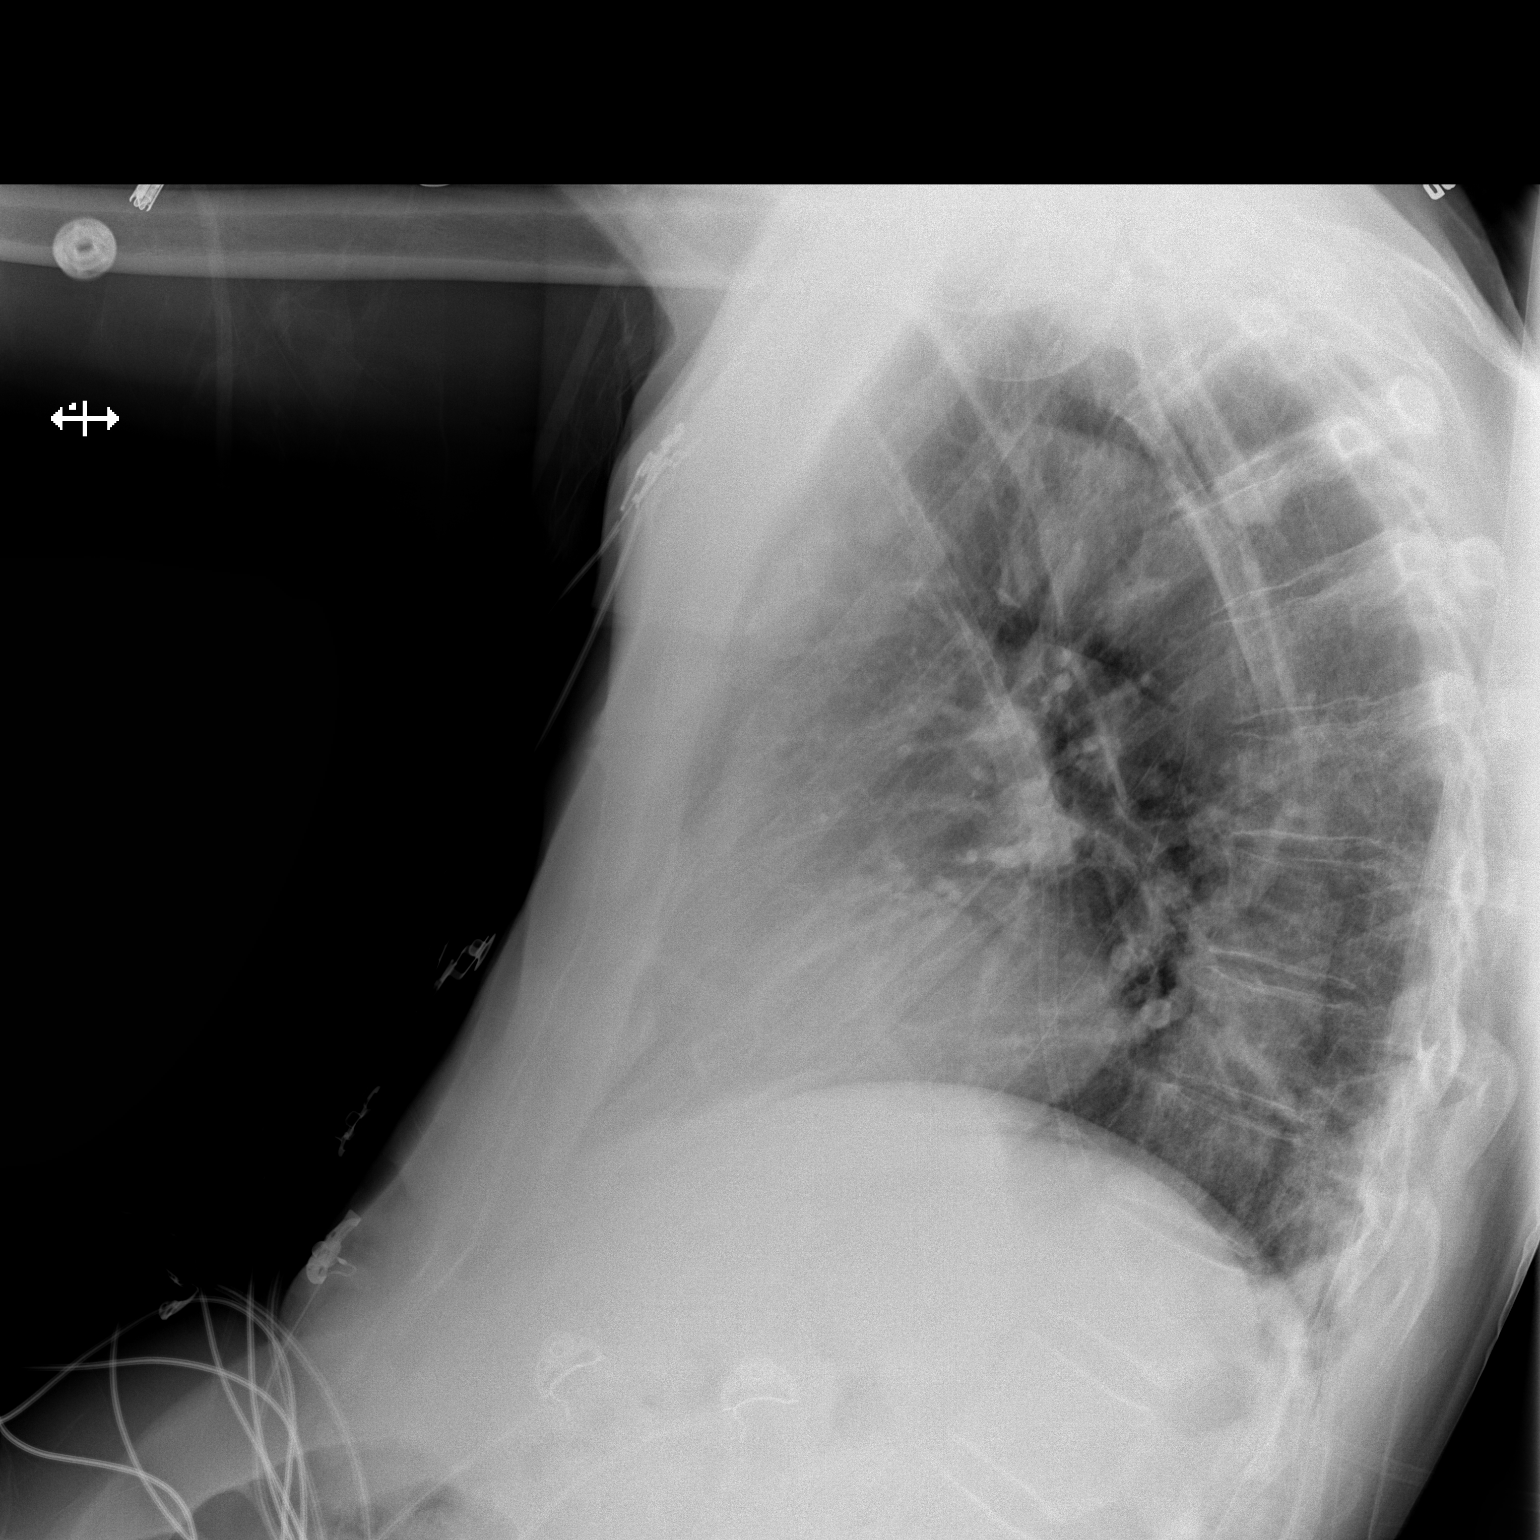

[2 of 2 positions shown; findings below may reference images not displayed]

FINDINGS: Left upper lobe mass without significant change from the prior
study. Remainder of the lungs is clear. No pleural effusion or
pneumothorax.

Cardiac silhouette is normal in size. No mediastinal or hilar masses
or evidence of adenopathy.

Bony thorax is intact.
IMPRESSION: No acute cardiopulmonary disease.
